# Patient Record
Sex: Male | Born: 1980 | Race: Black or African American | Hispanic: No | Marital: Single | Smoking: Never smoker
Health system: Southern US, Community
[De-identification: ages and names within clinical notes are randomized; demographics above are authoritative.]

## PROBLEM LIST (undated history)

## (undated) DIAGNOSIS — B20 Human immunodeficiency virus [HIV] disease: Principal | ICD-10-CM

## (undated) DIAGNOSIS — F419 Anxiety disorder, unspecified: Secondary | ICD-10-CM

## (undated) DIAGNOSIS — F32A Depression, unspecified: Secondary | ICD-10-CM

## (undated) DIAGNOSIS — F411 Generalized anxiety disorder: Secondary | ICD-10-CM

## (undated) DIAGNOSIS — F329 Major depressive disorder, single episode, unspecified: Secondary | ICD-10-CM

## (undated) DIAGNOSIS — F410 Panic disorder [episodic paroxysmal anxiety] without agoraphobia: Secondary | ICD-10-CM

## (undated) DIAGNOSIS — A630 Anogenital (venereal) warts: Secondary | ICD-10-CM

## (undated) DIAGNOSIS — B59 Pneumocystosis: Secondary | ICD-10-CM

## (undated) DIAGNOSIS — A539 Syphilis, unspecified: Secondary | ICD-10-CM

## (undated) DIAGNOSIS — K648 Other hemorrhoids: Secondary | ICD-10-CM

## (undated) DIAGNOSIS — K644 Residual hemorrhoidal skin tags: Secondary | ICD-10-CM

## (undated) DIAGNOSIS — N289 Disorder of kidney and ureter, unspecified: Secondary | ICD-10-CM

## (undated) DIAGNOSIS — D649 Anemia, unspecified: Secondary | ICD-10-CM

## (undated) DIAGNOSIS — I82409 Acute embolism and thrombosis of unspecified deep veins of unspecified lower extremity: Secondary | ICD-10-CM

## (undated) HISTORY — DX: Acute embolism and thrombosis of unspecified deep veins of unspecified lower extremity: I82.409

## (undated) HISTORY — DX: Major depressive disorder, single episode, unspecified: F32.9

## (undated) HISTORY — DX: Pneumocystosis: B59

## (undated) HISTORY — DX: Depression, unspecified: F32.A

## (undated) HISTORY — DX: Residual hemorrhoidal skin tags: K64.4

## (undated) HISTORY — DX: Syphilis, unspecified: A53.9

## (undated) HISTORY — DX: Human immunodeficiency virus (HIV) disease: B20

## (undated) HISTORY — DX: Anogenital (venereal) warts: A63.0

## (undated) HISTORY — DX: Generalized anxiety disorder: F41.1

## (undated) HISTORY — DX: Disorder of kidney and ureter, unspecified: N28.9

## (undated) HISTORY — DX: Anxiety disorder, unspecified: F41.9

## (undated) HISTORY — DX: Anemia, unspecified: D64.9

## (undated) HISTORY — DX: Other hemorrhoids: K64.8

## (undated) HISTORY — DX: Panic disorder (episodic paroxysmal anxiety): F41.0

---

## 2002-12-27 ENCOUNTER — Emergency Department (HOSPITAL_COMMUNITY): Admission: EM | Admit: 2002-12-27 | Discharge: 2002-12-27 | Payer: Self-pay | Admitting: Emergency Medicine

## 2003-01-11 ENCOUNTER — Emergency Department (HOSPITAL_COMMUNITY): Admission: EM | Admit: 2003-01-11 | Discharge: 2003-01-11 | Payer: Self-pay | Admitting: *Deleted

## 2008-12-10 ENCOUNTER — Emergency Department (HOSPITAL_BASED_OUTPATIENT_CLINIC_OR_DEPARTMENT_OTHER): Admission: EM | Admit: 2008-12-10 | Discharge: 2008-12-10 | Payer: Self-pay | Admitting: Emergency Medicine

## 2008-12-14 ENCOUNTER — Encounter: Admission: RE | Admit: 2008-12-14 | Discharge: 2008-12-14 | Payer: Self-pay | Admitting: Chiropractic Medicine

## 2009-12-05 ENCOUNTER — Encounter: Admission: RE | Admit: 2009-12-05 | Discharge: 2009-12-05 | Payer: Self-pay | Admitting: Nephrology

## 2009-12-05 ENCOUNTER — Encounter: Payer: Self-pay | Admitting: Infectious Disease

## 2009-12-15 ENCOUNTER — Encounter: Payer: Self-pay | Admitting: Emergency Medicine

## 2009-12-15 ENCOUNTER — Ambulatory Visit: Payer: Self-pay | Admitting: Diagnostic Radiology

## 2009-12-16 ENCOUNTER — Inpatient Hospital Stay (HOSPITAL_COMMUNITY): Admission: EM | Admit: 2009-12-16 | Discharge: 2009-12-21 | Payer: Self-pay | Admitting: Internal Medicine

## 2009-12-16 ENCOUNTER — Ambulatory Visit: Payer: Self-pay | Admitting: Vascular Surgery

## 2009-12-16 ENCOUNTER — Encounter (INDEPENDENT_AMBULATORY_CARE_PROVIDER_SITE_OTHER): Payer: Self-pay | Admitting: Internal Medicine

## 2009-12-16 DIAGNOSIS — I2699 Other pulmonary embolism without acute cor pulmonale: Secondary | ICD-10-CM | POA: Insufficient documentation

## 2009-12-18 ENCOUNTER — Ambulatory Visit: Payer: Self-pay | Admitting: Infectious Diseases

## 2009-12-21 ENCOUNTER — Encounter: Payer: Self-pay | Admitting: Infectious Disease

## 2009-12-22 ENCOUNTER — Telehealth: Payer: Self-pay | Admitting: Infectious Disease

## 2009-12-22 ENCOUNTER — Encounter (INDEPENDENT_AMBULATORY_CARE_PROVIDER_SITE_OTHER): Payer: Self-pay | Admitting: *Deleted

## 2009-12-22 ENCOUNTER — Ambulatory Visit: Payer: Self-pay | Admitting: Internal Medicine

## 2009-12-22 DIAGNOSIS — I82409 Acute embolism and thrombosis of unspecified deep veins of unspecified lower extremity: Secondary | ICD-10-CM | POA: Insufficient documentation

## 2009-12-22 LAB — CONVERTED CEMR LAB
INR: 2.88 — ABNORMAL HIGH (ref <1.50)
Prothrombin Time: 30.2 s — ABNORMAL HIGH (ref 11.6–15.2)

## 2009-12-25 ENCOUNTER — Ambulatory Visit: Payer: Self-pay | Admitting: Internal Medicine

## 2009-12-25 ENCOUNTER — Encounter: Payer: Self-pay | Admitting: Infectious Disease

## 2009-12-25 LAB — CONVERTED CEMR LAB: INR: 3.3

## 2010-01-01 ENCOUNTER — Ambulatory Visit: Payer: Self-pay | Admitting: Internal Medicine

## 2010-01-01 ENCOUNTER — Ambulatory Visit: Payer: Self-pay | Admitting: Infectious Disease

## 2010-01-01 DIAGNOSIS — B59 Pneumocystosis: Secondary | ICD-10-CM | POA: Insufficient documentation

## 2010-01-01 DIAGNOSIS — B20 Human immunodeficiency virus [HIV] disease: Secondary | ICD-10-CM | POA: Insufficient documentation

## 2010-01-01 DIAGNOSIS — F0630 Mood disorder due to known physiological condition, unspecified: Secondary | ICD-10-CM | POA: Insufficient documentation

## 2010-01-01 LAB — CONVERTED CEMR LAB
ALT: 62 units/L — ABNORMAL HIGH (ref 0–53)
AST: 34 units/L (ref 0–37)
Albumin: 3.8 g/dL (ref 3.5–5.2)
Alkaline Phosphatase: 113 units/L (ref 39–117)
BUN: 14 mg/dL (ref 6–23)
Basophils Absolute: 0 10*3/uL (ref 0.0–0.1)
Basophils Relative: 0 % (ref 0–1)
CO2: 22 meq/L (ref 19–32)
Calcium: 8.7 mg/dL (ref 8.4–10.5)
Chlamydia, Swab/Urine, PCR: NEGATIVE
Chloride: 101 meq/L (ref 96–112)
Creatinine, Ser: 0.96 mg/dL (ref 0.40–1.50)
Eosinophils Absolute: 0.2 10*3/uL (ref 0.0–0.7)
Eosinophils Relative: 5 % (ref 0–5)
GC Probe Amp, Urine: NEGATIVE
Glucose, Bld: 93 mg/dL (ref 70–99)
HCT: 35.6 % — ABNORMAL LOW (ref 39.0–52.0)
HIV 1 RNA Quant: 639 copies/mL — ABNORMAL HIGH (ref <20)
HIV-1 RNA Quant, Log: 2.81 — ABNORMAL HIGH (ref <1.30)
Hemoglobin: 11.6 g/dL — ABNORMAL LOW (ref 13.0–17.0)
INR: 2.2
Lymphocytes Relative: 16 % (ref 12–46)
Lymphs Abs: 0.8 10*3/uL (ref 0.7–4.0)
MCHC: 32.6 g/dL (ref 30.0–36.0)
MCV: 82.2 fL (ref 78.0–100.0)
Monocytes Absolute: 0.6 10*3/uL (ref 0.1–1.0)
Monocytes Relative: 12 % (ref 3–12)
Neutro Abs: 3.3 10*3/uL (ref 1.7–7.7)
Neutrophils Relative %: 67 % (ref 43–77)
Platelets: 284 10*3/uL (ref 150–400)
Potassium: 4.1 meq/L (ref 3.5–5.3)
RBC: 4.33 M/uL (ref 4.22–5.81)
RDW: 18.4 % — ABNORMAL HIGH (ref 11.5–15.5)
Sodium: 133 meq/L — ABNORMAL LOW (ref 135–145)
Total Bilirubin: 0.2 mg/dL — ABNORMAL LOW (ref 0.3–1.2)
Total Protein: 6.9 g/dL (ref 6.0–8.3)
WBC: 4.9 10*3/uL (ref 4.0–10.5)

## 2010-01-03 ENCOUNTER — Encounter: Payer: Self-pay | Admitting: Infectious Disease

## 2010-01-08 ENCOUNTER — Ambulatory Visit: Payer: Self-pay | Admitting: Internal Medicine

## 2010-01-08 LAB — CONVERTED CEMR LAB: INR: 1.7

## 2010-01-15 ENCOUNTER — Telehealth: Payer: Self-pay | Admitting: Infectious Disease

## 2010-01-15 ENCOUNTER — Ambulatory Visit: Payer: Self-pay | Admitting: Internal Medicine

## 2010-01-15 LAB — CONVERTED CEMR LAB: INR: 1.7

## 2010-01-29 ENCOUNTER — Ambulatory Visit: Payer: Self-pay | Admitting: Internal Medicine

## 2010-01-29 LAB — CONVERTED CEMR LAB: INR: 2.3

## 2010-01-31 ENCOUNTER — Ambulatory Visit: Payer: Self-pay | Admitting: Infectious Disease

## 2010-01-31 ENCOUNTER — Encounter (INDEPENDENT_AMBULATORY_CARE_PROVIDER_SITE_OTHER): Payer: Self-pay | Admitting: *Deleted

## 2010-01-31 LAB — CONVERTED CEMR LAB
ALT: 24 units/L (ref 0–53)
AST: 28 units/L (ref 0–37)
Albumin: 4.6 g/dL (ref 3.5–5.2)
Alkaline Phosphatase: 110 units/L (ref 39–117)
BUN: 6 mg/dL (ref 6–23)
Basophils Absolute: 0.1 10*3/uL (ref 0.0–0.1)
Basophils Relative: 2 % — ABNORMAL HIGH (ref 0–1)
CO2: 24 meq/L (ref 19–32)
Calcium: 9.3 mg/dL (ref 8.4–10.5)
Chloride: 103 meq/L (ref 96–112)
Creatinine, Ser: 1.01 mg/dL (ref 0.40–1.50)
Eosinophils Absolute: 0.5 10*3/uL (ref 0.0–0.7)
Eosinophils Relative: 14 % — ABNORMAL HIGH (ref 0–5)
Glucose, Bld: 100 mg/dL — ABNORMAL HIGH (ref 70–99)
HCT: 40.4 % (ref 39.0–52.0)
HIV 1 RNA Quant: 87 copies/mL — ABNORMAL HIGH (ref <20)
HIV-1 RNA Quant, Log: 1.94 — ABNORMAL HIGH (ref <1.30)
Hemoglobin: 13 g/dL (ref 13.0–17.0)
Lymphocytes Relative: 49 % — ABNORMAL HIGH (ref 12–46)
Lymphs Abs: 1.9 10*3/uL (ref 0.7–4.0)
MCHC: 32.2 g/dL (ref 30.0–36.0)
MCV: 86.5 fL (ref 78.0–100.0)
Monocytes Absolute: 0.6 10*3/uL (ref 0.1–1.0)
Monocytes Relative: 14 % — ABNORMAL HIGH (ref 3–12)
Neutro Abs: 0.9 10*3/uL — ABNORMAL LOW (ref 1.7–7.7)
Neutrophils Relative %: 22 % — ABNORMAL LOW (ref 43–77)
Platelets: 246 10*3/uL (ref 150–400)
Potassium: 4 meq/L (ref 3.5–5.3)
RBC: 4.67 M/uL (ref 4.22–5.81)
RDW: 19.7 % — ABNORMAL HIGH (ref 11.5–15.5)
Sodium: 137 meq/L (ref 135–145)
Total Bilirubin: 0.2 mg/dL — ABNORMAL LOW (ref 0.3–1.2)
Total Protein: 7.4 g/dL (ref 6.0–8.3)
WBC: 3.9 10*3/uL — ABNORMAL LOW (ref 4.0–10.5)

## 2010-02-05 ENCOUNTER — Encounter: Payer: Self-pay | Admitting: Infectious Diseases

## 2010-02-06 ENCOUNTER — Telehealth (INDEPENDENT_AMBULATORY_CARE_PROVIDER_SITE_OTHER): Payer: Self-pay | Admitting: *Deleted

## 2010-02-12 ENCOUNTER — Ambulatory Visit: Payer: Self-pay | Admitting: Internal Medicine

## 2010-02-12 LAB — CONVERTED CEMR LAB: INR: 2.3

## 2010-02-14 ENCOUNTER — Ambulatory Visit: Payer: Self-pay | Admitting: Infectious Disease

## 2010-02-14 ENCOUNTER — Encounter (INDEPENDENT_AMBULATORY_CARE_PROVIDER_SITE_OTHER): Payer: Self-pay | Admitting: *Deleted

## 2010-02-26 ENCOUNTER — Ambulatory Visit: Payer: Self-pay | Admitting: Internal Medicine

## 2010-02-26 LAB — CONVERTED CEMR LAB: INR: 2.8

## 2010-03-19 ENCOUNTER — Ambulatory Visit: Payer: Self-pay | Admitting: Internal Medicine

## 2010-03-19 LAB — CONVERTED CEMR LAB: INR: 2.5

## 2010-03-23 ENCOUNTER — Encounter: Payer: Self-pay | Admitting: Infectious Disease

## 2010-03-28 ENCOUNTER — Encounter: Payer: Self-pay | Admitting: Infectious Disease

## 2010-03-28 ENCOUNTER — Ambulatory Visit
Admission: RE | Admit: 2010-03-28 | Discharge: 2010-03-28 | Payer: Self-pay | Source: Home / Self Care | Attending: Infectious Disease | Admitting: Infectious Disease

## 2010-03-28 LAB — CONVERTED CEMR LAB
ALT: 19 units/L (ref 0–53)
AST: 21 units/L (ref 0–37)
Albumin: 4.9 g/dL (ref 3.5–5.2)
Alkaline Phosphatase: 84 units/L (ref 39–117)
BUN: 11 mg/dL (ref 6–23)
Basophils Absolute: 0 10*3/uL (ref 0.0–0.1)
Basophils Relative: 1 % (ref 0–1)
CO2: 24 meq/L (ref 19–32)
Calcium: 9.9 mg/dL (ref 8.4–10.5)
Chloride: 104 meq/L (ref 96–112)
Cholesterol: 230 mg/dL — ABNORMAL HIGH (ref 0–200)
Creatinine, Ser: 1.02 mg/dL (ref 0.40–1.50)
Eosinophils Absolute: 0.1 10*3/uL (ref 0.0–0.7)
Eosinophils Relative: 4 % (ref 0–5)
Glucose, Bld: 102 mg/dL — ABNORMAL HIGH (ref 70–99)
HCT: 41.6 % (ref 39.0–52.0)
HDL: 62 mg/dL (ref >39)
HIV 1 RNA Quant: <20 copies/mL (ref <20)
HIV-1 RNA Quant, Log: <1.3 (ref <1.30)
Hemoglobin: 13.4 g/dL (ref 13.0–17.0)
LDL Cholesterol: 143 mg/dL — ABNORMAL HIGH (ref 0–99)
Lymphocytes Relative: 44 % (ref 12–46)
Lymphs Abs: 1.3 10*3/uL (ref 0.7–4.0)
MCHC: 32.2 g/dL (ref 30.0–36.0)
MCV: 88.9 fL (ref 78.0–100.0)
Monocytes Absolute: 0.4 10*3/uL (ref 0.1–1.0)
Monocytes Relative: 12 % (ref 3–12)
Neutro Abs: 1.2 10*3/uL — ABNORMAL LOW (ref 1.7–7.7)
Neutrophils Relative %: 39 % — ABNORMAL LOW (ref 43–77)
Platelets: 240 10*3/uL (ref 150–400)
Potassium: 3.9 meq/L (ref 3.5–5.3)
RBC: 4.68 M/uL (ref 4.22–5.81)
RDW: 14.8 % (ref 11.5–15.5)
Sodium: 140 meq/L (ref 135–145)
Total Bilirubin: 0.2 mg/dL — ABNORMAL LOW (ref 0.3–1.2)
Total CHOL/HDL Ratio: 3.7
Total Protein: 7.6 g/dL (ref 6.0–8.3)
Triglycerides: 126 mg/dL (ref <150)
VLDL: 25 mg/dL (ref 0–40)
WBC: 3 10*3/uL — ABNORMAL LOW (ref 4.0–10.5)

## 2010-04-02 LAB — T-HELPER CELL (CD4) - (RCID CLINIC ONLY)
CD4 % Helper T Cell: 4 % — ABNORMAL LOW (ref 33–55)
CD4 T Cell Abs: 60 uL — ABNORMAL LOW (ref 400–2700)

## 2010-04-07 DIAGNOSIS — I82409 Acute embolism and thrombosis of unspecified deep veins of unspecified lower extremity: Secondary | ICD-10-CM

## 2010-04-07 DIAGNOSIS — I2699 Other pulmonary embolism without acute cor pulmonale: Secondary | ICD-10-CM

## 2010-04-07 DIAGNOSIS — Z7901 Long term (current) use of anticoagulants: Secondary | ICD-10-CM

## 2010-04-10 ENCOUNTER — Telehealth: Payer: Self-pay | Admitting: *Deleted

## 2010-04-11 ENCOUNTER — Ambulatory Visit
Admission: RE | Admit: 2010-04-11 | Discharge: 2010-04-11 | Payer: Self-pay | Source: Home / Self Care | Attending: Infectious Disease | Admitting: Infectious Disease

## 2010-04-16 ENCOUNTER — Ambulatory Visit: Admission: RE | Admit: 2010-04-16 | Discharge: 2010-04-16 | Payer: Self-pay | Source: Home / Self Care

## 2010-04-16 LAB — CONVERTED CEMR LAB: INR: 3.9

## 2010-04-17 NOTE — Assessment & Plan Note (Signed)
Summary: [name]  Anticoagulant Therapy Managed by: Barbera Setters. Lance Craig  PharmD CACP PCP: Dr> Kerrie Buffalo Attending: Rogelia Boga MD, Lanora Manis Indication 1: Pulmonary  embolus Indication 2: Encounter for therapeutic drug monitoring  V58.83 Start date: 12/16/2009 Duration: 6 months  Patient Assessment Reviewed by: Chancy Milroy PharmD  January 15, 2010 Medication review: verified warfarin dosage & schedule,verified previous prescription medications, verified doses & any changes, verified new medications, reviewed OTC medications, reviewed OTC health products-vitamins supplements etc Complications: none Dietary changes: none   Health status changes: none   Lifestyle changes: none   Recent/future hospitalizations: none   Recent/future procedures: none   Recent/future dental: none Patient Assessment Part 2:  Have you MISSED ANY DOSES or CHANGED TABLETS?  No missed Warfarin doses or changed tablets.  Have you had any BRUISING or BLEEDING ( nose or gum bleeds,blood in urine or stool)?  No reported bruising or bleeding in nose, gums, urine, stool.  Have you STARTED or STOPPED any MEDICATIONS, including OTC meds,herbals or supplements?  No other medications or herbal supplements were started or stopped.  Have you CHANGED your DIET, especially green vegetables,or ALCOHOL intake?  No changes in diet or alcohol intake.  Have you had any ILLNESSES or HOSPITALIZATIONS?  No reported illnesses or hospitalizations  Have you had any signs of CLOTTING?(chest discomfort,dizziness,shortness of breath,arms tingling,slurred speech,swelling or redness in leg)    No chest discomfort, dizziness, shortness of breath, tingling in arm, slurred speech, swelling, or redness in leg.     Treatment  Target INR: 2.0-3.0 INR: 1.7  Date: 01/15/2010 Regimen In:  30.0mg /week INR reflects regimen in: 1.7  New  Tablet strength: : 5mg  Regimen Out:     Sunday: 1 Tablet     Monday: 1 & 1/2 Tablet     Tuesday: 1  Tablet     Wednesday: 1 Tablet     Thursday: 1 & 1/2 Tablet      Friday: 1 Tablet     Saturday: 1 Tablet Total Weekly: 40.0mg /week mg  Next INR Due: 01/29/2010 Adjusted by: Barbera Setters. Alexandria Lodge [name] PharmD CACP   Return to anticoagulation clinic:  01/29/2010 Time of next visit: 1100    Allergies: No Known Drug Allergies

## 2010-04-17 NOTE — Assessment & Plan Note (Signed)
Summary: [name]  Anticoagulant Therapy Managed by: Barbera Setters. Lance Craig  PharmD CACP [facility] Attending: Blanch Media MD Indication 1: Pulmonary  embolus Indication 2: Encounter for therapeutic drug monitoring  V58.83 Start date: 12/16/2009 Duration: 6 months  Patient Assessment Reviewed by: Chancy Milroy PharmD  January 01, 2010 Medication review: verified warfarin dosage & schedule,verified previous prescription medications, verified doses & any changes, verified new medications, reviewed OTC medications, reviewed OTC health products-vitamins supplements etc Complications: none Dietary changes: none   Health status changes: none   Lifestyle changes: none   Recent/future hospitalizations: none   Recent/future procedures: none   Recent/future dental: none Patient Assessment Part 2:  Have you MISSED ANY DOSES or CHANGED TABLETS?  No missed Warfarin doses or changed tablets.  Have you had any BRUISING or BLEEDING ( nose or gum bleeds,blood in urine or stool)?  No reported bruising or bleeding in nose, gums, urine, stool.  Have you STARTED or STOPPED any MEDICATIONS, including OTC meds,herbals or supplements?  No other medications or herbal supplements were started or stopped.  Have you CHANGED your DIET, especially green vegetables,or ALCOHOL intake?  No changes in diet or alcohol intake.  Have you had any ILLNESSES or HOSPITALIZATIONS?  No reported illnesses or hospitalizations  Have you had any signs of CLOTTING?(chest discomfort,dizziness,shortness of breath,arms tingling,slurred speech,swelling or redness in leg)    No chest discomfort, dizziness, shortness of breath, tingling in arm, slurred speech, swelling, or redness in leg.     Treatment  Target INR: 2.0-3.0 INR: 2.2  Date: 01/01/2010 INR reflects regimen in: 2.2  New  Tablet strength: : 5mg  Regimen Out:     Sunday: 1/2 Tablet     Monday: 1/ Tablet     Tuesday: 1/2 Tablet     Wednesday: 1 Tablet     Thursday:  1/2 Tablet      Friday: 1 Tablet     Saturday: 1/2 Tablet Total Weekly: 25.0mg /week mg  Next INR Due: 01/08/2010 Adjusted by: Barbera Setters. Alexandria Lodge [name] PharmD CACP   Return to anticoagulation clinic:  01/08/2010 Time of next visit: 1030

## 2010-04-17 NOTE — Assessment & Plan Note (Signed)
Summary: [name]  Anticoagulant Therapy Managed by: Barbera Setters. Janie Morning  PharmD CACP PCP: Dr> Bascom Levels [facility] Attending: Darl Pikes, Beth Indication 1: Pulmonary  embolus Indication 2: Encounter for therapeutic drug monitoring  V58.83 Start date: 12/16/2009 Duration: 6 months  Patient Assessment Reviewed by: Chancy Milroy PharmD  February 12, 2010 Medication review: verified warfarin dosage & schedule,verified previous prescription medications, verified doses & any changes, verified new medications, reviewed OTC medications, reviewed OTC health products-vitamins supplements etc Complications: none Dietary changes: none   Health status changes: none   Lifestyle changes: none   Recent/future hospitalizations: none   Recent/future procedures: none   Recent/future dental: none Patient Assessment Part 2:  Have you MISSED ANY DOSES or CHANGED TABLETS?  No missed Warfarin doses or changed tablets.  Have you had any BRUISING or BLEEDING ( nose or gum bleeds,blood in urine or stool)?  No reported bruising or bleeding in nose, gums, urine, stool.  Have you STARTED or STOPPED any MEDICATIONS, including OTC meds,herbals or supplements?  No other medications or herbal supplements were started or stopped.  Have you CHANGED your DIET, especially green vegetables,or ALCOHOL intake?  No changes in diet or alcohol intake.  Have you had any ILLNESSES or HOSPITALIZATIONS?  No reported illnesses or hospitalizations  Have you had any signs of CLOTTING?(chest discomfort,dizziness,shortness of breath,arms tingling,slurred speech,swelling or redness in leg)    No chest discomfort, dizziness, shortness of breath, tingling in arm, slurred speech, swelling, or redness in leg.     Treatment  Target INR: 2.0-3.0 INR: 2.3  Date: 02/12/2010 Regimen In:  45.0mg /week INR reflects regimen in: 2.3  New  Tablet strength: : 5mg  Regimen Out:     Sunday: 1 & 1/2 Tablet     Monday: 1 & 1/2 Tablet     Tuesday: 1  & 1/2 Tablet     Wednesday: 1 & 1/2 Tablet     Thursday: 1 & 1/2 Tablet      Friday: 1 & 1/2 Tablet     Saturday: 1 & 1/2 Tablet Total Weekly: 52.5mg /week mg  Next INR Due: 02/26/2010 Adjusted by: Barbera Setters. Alexandria Lodge [name] PharmD CACP   Return to anticoagulation clinic:  02/26/2010 Time of next visit: 1145   Comments: Sees Dr. Daiva Eves for ID. Dr. Daiva Eves is who referred this patient to our anticoagulation clinic.  Allergies: No Known Drug Allergies

## 2010-04-17 NOTE — Miscellaneous (Signed)
Summary: HIPAA Restrictions  HIPAA Restrictions   Imported By: Florinda Marker 01/03/2010 10:31:50  _____________________________________________________________________  External Attachment:    Type:   Image     Comment:   External Document

## 2010-04-17 NOTE — Assessment & Plan Note (Signed)
Summary: 6WK F/U/VS   Visit Type:  Follow-up Referring Provider:  Dr. Bascom Levels Primary Provider:  Dr. Daiva Eves  CC:  f/u .  History of Present Illness: 30 year old African American male recently admitted to [facility] after failign thrapy with fluoroquinlone and azitthromcyin for pneumonia bilateral, foudn to have Pulmoanry embolism and PCP pneumonia and newly diagnosied HIV, AIDS. HIs cd4<10 and his viral load in hudrends of thousands. His Hep A, B sag, sab, C abs were negative and his rpr negative in screenign. He was started on atripla as inpatient. Genotgype prior to starting ARV showed one etravirine mutaiton but full susceptiblity to [place]. He has done relatively well as outpt but still not plugged into adap. He is continuing his tmp/smx as prophylaxis. He is being followed by Dr. Alexandria Lodge in [facility] to assist with coumadin dosing. 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 Take 1 Tab By Mouth At Bedtime 2)  Sulfamethoxazole-Trimethoprim 400-80 Mg Tabs (Sulfamethoxazole-Trimethoprim) .... Take 1 Tablet By Mouth Once A Day 3)  Prednisone 20 Mg Tabs (Prednisone) .... Take Two Tablets Twice Daily Thru 10/8, Then Two Tablets Once Daily Thru 10/13, Then On Tab Daily Thru 10/23 4)  Zithromax 600 Mg Tabs (Azithromycin) .... Take Two Tabets Weekly 5)  Ativan 0.5 Mg Tabs (Lorazepam) .... 1/2 By Mouth Every 8 Hours As Needed 6)  Ambien 5 Mg Tabs (Zolpidem Tartrate) .Marland Kitchen.. 1 Daily At Bedtime 7)  Flexeril 10 Mg Tabs (Cyclobenzaprine Hcl) .Marland Kitchen.. 1 Tablet By Mouth Daily As Needed 8)  Folic Acid 1 Mg Tabs (Folic Acid) .Marland Kitchen.. 1 Milligram By Mouth Daily 9)   Hydrocodone-Acetaminophen 5-325 Mg Tabs (Hydrocodone-Acetaminophen) .Marland Kitchen.. 1 To 2 Tablets By Mouth Every 6 Hours As Needed  Current Medications (verified): 1)  Atripla 600-200-300 Mg Tabs (Efavirenz-Emtricitab-Tenofovir) .... Take 1 Tab By Mouth At Bedtime 2)  Sulfamethoxazole-Trimethoprim 400-80 Mg Tabs (Sulfamethoxazole-Trimethoprim) .... Take 1 Tablet By Mouth Once A Day 3)  Zithromax 600 Mg Tabs (Azithromycin) .... Take Two Tabets Weekly 4)  Ativan 0.5 Mg Tabs (Lorazepam) .... 1/2 By Mouth Every 8 Hours As Needed 5)  Ambien 5 Mg Tabs (Zolpidem Tartrate) .Marland Kitchen.. 1 Daily At Bedtime 6)  Folic Acid 1 Mg Tabs (Folic Acid) .Marland Kitchen.. 1 Milligram By Mouth Daily 7)  Hydrocodone-Acetaminophen 5-325 Mg Tabs (Hydrocodone-Acetaminophen) .Marland Kitchen.. 1 To 2 Tablets By Mouth Every 6 Hours As Needed 8)  Coumadin 5 Mg Tabs (Warfarin Sodium) .... Take As Directed  Allergies (verified): No Known Drug Allergies    Current Allergies (reviewed today): No known allergies  Past History:  Past Medical History: Last updated: 01/01/2010 AIDS Sepember 2011 PCP pneumonia 2011 Pulmonary embolism Anxiety  Family History: Last updated: 01/01/2010 noncontribitory  Social History: Last updated: 01/01/2010 risk for HIV, MSM, rare alcohol  Family History: Reviewed history from 01/01/2010 and no changes required. noncontribitory  Social History: Reviewed history from 01/01/2010 and no changes required. risk for HIV, MSM, rare alcohol  Review of Systems  The patient denies anorexia, fever, weight loss, weight gain, vision loss, decreased hearing, hoarseness, chest pain, syncope, dyspnea on exertion, peripheral edema, prolonged cough, headaches, hemoptysis, abdominal pain, melena, hematochezia, severe indigestion/heartburn, hematuria, incontinence, genital sores, muscle weakness, suspicious  skin lesions, transient blindness, difficulty walking, depression, unusual weight change, abnormal bleeding, and enlarged lymph nodes.     Vital Signs:  Patient profile:   30 year old male Height:      68 inches (172.72 cm) Weight:      178.25 pounds (81.02 kg) BMI:     27.20 Temp:     98.3 degrees F (36.83 degrees C) oral Pulse rate:   78 / minute BP sitting:   135 / 86  (left arm)  Vitals Entered By: Starleen Arms CMA (February 14, 2010 10:02 AM) CC: f/u  Is Patient Diabetic? No Pain Assessment Patient in pain? no      Nutritional Status BMI of 25 - 29 = overweight  Does patient need assistance? Functional Status Self care Ambulation Normal   Physical Exam  General:  alert and well-nourished.  anxioius and with partner who was loudly and impatiently sighing initially Head:  normocephalic and atraumatic.   Eyes:  vision grossly intact, pupils equal, pupils round, and pupils reactive to light.   Ears:  no external deformities.   Nose:  no external deformity and no external erythema.   Mouth:  pharynx pink and moist, no erythema, and no exudates.  no thrush Lungs:  normal respiratory effort, no crackles, and no wheezes.   Heart:  normal rate, regular rhythm, no murmur, no gallop, and no rub.   Abdomen:  soft, non-tender, normal bowel sounds, and no distention.   Msk:  normal ROM and no joint tenderness.   Extremities:  No clubbing, cyanosis, edema, or deformity noted with normal full range of motion of all joints.   Neurologic:  alert & oriented X3, strength normal in all extremities, and gait normal.   Skin:  color normal, no rashes, and no suspicious lesions.   Psych:  Oriented X3, memory intact for recent and remote, dysphoric affect, and depressed affect.          Medication Adherence: 02/14/2010   Adherence to medications reviewed with patient. Counseling to provide adequate adherence provided      Education Materials Provided: 02/14/2010 Safe sex practices discussed with patient. Condoms offered.                          Impression & Recommendations:  Problem # 1:  AIDS  (ICD-042)  Good virological control His updated medication list for this problem includes:    Sulfamethoxazole-trimethoprim 400-80 Mg Tabs (Sulfamethoxazole-trimethoprim) .Marland Kitchen... Take 1 tablet by mouth once a day    Zithromax 600 Mg Tabs (Azithromycin) .Marland Kitchen... Take two tabets weekly  Diagnostics Reviewed:  HIV: CDC-defined AIDS (01/01/2010)   CD4: 40 (02/01/2010)   WBC: 3.9 (01/31/2010)   Hgb: 13.0 (01/31/2010)   HCT: 40.4 (01/31/2010)   Platelets: 246 (01/31/2010) HIV-1 RNA: 87 (01/31/2010)     Orders: Est. Patient Level IV (11914)  Problem # 2:  PNEUMOCYSTIS PNEUMONIA (ICD-136.3)  resolved  Orders: Est. Patient Level IV (78295)  Problem # 3:  PE (ICD-415.19)  continue coumadin His updated medication list for this problem includes:    Coumadin 5 Mg Tabs (Warfarin sodium) .Marland Kitchen... Take as directed  Orders: Est. 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 Patient Level IV (86578)  Medications Added to Medication List This Visit: 1)  Coumadin 5 Mg Tabs (Warfarin sodium) .... Take as directed  Other Orders: Hepatitis A Vaccine (Adult Dose) (46962) Admin  1st Vaccine (16109) Hepatitis B Vaccine >93yrs [phone]) Admin of Any Addtl Vaccine (09811) Future Orders: T-CD4SP ([facility]) (CD4SP) ... 03/28/2010 T-HIV Viral Load [phone]) ... 03/28/2010 T-CBC w/Diff ([id number]) ... 03/28/2010 T-Lipid Profile [phone]) ... 03/28/2010 T-Comprehensive Metabolic Panel [phone]) ... 03/28/2010  Patient Instructions: 1)  Please schedule a follow-up appointment in 2  month. 2)  Advised not to eat any food or drink any liquids after 10 PM the night beforelabs 3)  Be sure to return for lab work one (1) week before your next appointment as scheduled.    Immunizations Administered:  Hepatitis A Vaccine # 1:    Vaccine Type: HepA    Site: right deltoid    Mfr: Merck    Dose: 1.0 ml     Route: IM    Given by: Starleen Arms CMA    Exp. Date: 03/05/2012    Lot #: [id number]    VIS given: 06/05/04 version given February 14, 2010.  Hepatitis B Vaccine # 1:    Vaccine Type: HepB Adult    Site: right deltoid    Mfr: GlaxoSmithKline    Dose: 1.0 ml    Route: IM    Given by: Starleen Arms CMA    Exp. Date: 04/20/2012    Lot #: [id number]    VIS given: 10/02/05 version given February 14, 2010. Prescriptions: COUMADIN 5 MG TABS (WARFARIN SODIUM) take as directed  #30 x 11   Entered and Authorized by:   Acey Lav MD   Signed by:   Paulette Blanch Dam MD on 02/14/2010   Method used:   Electronically to        [facility] [address] (retail)       [phone]       [place], [place]  [address]       Ph: [phone]       Fax: [phone]   RxID:   [id number] FOLIC ACID 1 MG TABS (FOLIC ACID) 1 milligram by mouth daily  #30 x 1   Entered and Authorized by:   Acey Lav MD   Signed by:   Paulette Blanch Dam MD on 02/14/2010   Method used:   Electronically to        [facility] [address] (retail)       [phone]       [place], [place]  [address]       Ph: [phone]       Fax: [phone]   RxID:   [id number] HYDROCODONE-ACETAMINOPHEN 5-325 MG TABS (HYDROCODONE-ACETAMINOPHEN) 1 to 2 tablets by mouth every 6 hours as needed  #20 x 0   Entered and Authorized by:   Acey Lav MD   Signed by:   Paulette Blanch Dam MD on 02/14/2010   Method used:   Print then Give to Patient   RxID:   [id number]

## 2010-04-17 NOTE — Miscellaneous (Signed)
Summary: Medicatiion update  Clinical Lists Changes  Medications: Changed medication from SULFAMETHOXAZOLE-TRIMETHOPRIM 400-80 MG TABS (SULFAMETHOXAZOLE-TRIMETHOPRIM) two tablets three times daily until October 23rd, then one tablet daily to SULFAMETHOXAZOLE-TRIMETHOPRIM 400-80 MG TABS (SULFAMETHOXAZOLE-TRIMETHOPRIM) Take 1 tablet by mouth once a day

## 2010-04-17 NOTE — Miscellaneous (Signed)
Summary: [facility] Financial Update   Clinical Lists Changes  Observations: Added new observation of PAYOR: No Insurance (02/14/2010 11:17) Added new observation of RWTITLE: B (02/14/2010 11:17) Added new observation of AIDSDAP: Yes 2011 (02/14/2010 11:17) Added new observation of PCTFPL: 0  (02/14/2010 11:17) Added new observation of INCOMESOURCE: none  (02/14/2010 11:17) Added new observation of HOUSEINCOME: 0  (02/14/2010 11:17) Added new observation of #CHILD<18 IN: 0  (02/14/2010 11:17) Added new observation of FAMILYSIZE: 1  (02/14/2010 11:17) Added new observation of HOUSING: Stable/permanent  (02/14/2010 11:17) Added new observation of FINASSESSDT: 02/14/2010  (02/14/2010 11:17) Added new observation of YEARLYEXPEN: 0  (02/14/2010 11:17)

## 2010-04-17 NOTE — Progress Notes (Signed)
Summary: Refill, OK to refill for pt?  Phone Note Refill Request Message from:  Patient on January 15, 2010 11:36 AM  Refills Requested: Medication #1:  FOLIC ACID 1 MG TABS 1 milligram by mouth daily Pt here to see Dr. Alexandria Lodge asking for refill on medication.   Follow-up for Phone Call        Fine with me Follow-up by: Acey Lav MD,  January 17, 2010 8:20 PM

## 2010-04-17 NOTE — Progress Notes (Signed)
Summary: ADAP meds  Phone Note Call from Patient   Caller: Patient Summary of Call: pt. came into office to check on ADAP meds, called [facility] and spoke with pharmacist, Asim they were unable to reach pt.  Meds will be overnight shipped and should arrive tomorrow 02/07/10 Initial call taken by: Wendall Mola CMA Duncan Dull),  February 06, 2010 11:34 AM     Appended Document: ADAP meds ADAP meds arrived, pt. picked up

## 2010-04-17 NOTE — Miscellaneous (Signed)
Summary: Adrian DDS  Pringle DDS   Imported By: Florinda Marker 02/06/2010 11:42:08  _____________________________________________________________________  External Attachment:    Type:   Image     Comment:   External Document

## 2010-04-17 NOTE — Assessment & Plan Note (Addendum)
Summary: hsfu 042/need chart/kvd sawinpt   Visit Type:  Follow-up Referring Provider:  Dr. Bascom Levels Primary Provider:  Dr> Bascom Levels  CC:  AIDs and PCP pneumoinia.  History of Present Illness: 30 year old African American male recently admitted to [facility] after failign thrapy with fluoroquinlone and azitthromcyin for pneumonia bilateral, foudn to have Pulmoanry embolism and PCP pneumonia and newly diagnosied HIV, AIDS. HIs cd4<10 and his viral load in hudrends of thousands. His Hep A, B sag, sab, C abs were negative and his rpr negative in screenign. He was started on atripla as inpatient. Genotgype prior to starting ARV showed one etravirine mutaiton but full susceptiblity to [place]. He has done relatively well as outpt but still not plugged into adap. He took last TMP SMX pill yesterday. He is moderately anxoius. When I offered him SSRI he seemd initally willin to tak script but then decided again this when his partner (unkown HIV status, apparently Ab negative) suggested against this.  Problems Prior to Update: 1)  Pe  (ICD-415.19) 2)  Long-term Use of Antiplatelet/antithrombotic  (ICD-V58.63)  Medications Prior to Update: 1)  Atripla 600-200-300 Mg Tabs (Efavirenz-Emtricitab-Tenofovir) .... Take 1 Tab By Mouth At Bedtime 2)  Sulfamethoxazole-Trimethoprim 400-80 Mg Tabs (Sulfamethoxazole-Trimethoprim) .... Two Tablets Three Times Daily Until October 23rd, Then One Tablet Daily 3)  Prednisone 20 Mg Tabs (Prednisone) .... Take Two Tablets Twice Daily Thru 10/8, Then Two Tablets Once Daily Thru 10/13, Then On Tab Daily Thru 10/23 4)  Zithromax 600 Mg Tabs (Azithromycin) .... Take Two Tabets Weekly 5)  Warfarin Sodium 5 Mg Tabs (Warfarin Sodium) .... Take As Directed.  Current Medications (verified): 1)  Atripla 600-200-300 Mg Tabs (Efavirenz-Emtricitab-Tenofovir) .... Take 1 Tab By Mouth At Bedtime 2)  Sulfamethoxazole-Trimethoprim 400-80 Mg Tabs (Sulfamethoxazole-Trimethoprim) .... Two Tablets  Three Times Daily Until October 23rd, Then One Tablet Daily 3)  Prednisone 20 Mg Tabs (Prednisone) .... Take Two Tablets Twice Daily Thru 10/8, Then Two Tablets Once Daily Thru 10/13, Then On Tab Daily Thru 10/23 4)  Zithromax 600 Mg Tabs (Azithromycin) .... Take Two Tabets Weekly 5)  Warfarin Sodium 5 Mg Tabs (Warfarin Sodium) .... Take As Directed. 6)  Ativan 0.5 Mg Tabs (Lorazepam) .... 1/2 By Mouth Every 8 Hours As Needed 7)  Ambien 5 Mg Tabs (Zolpidem Tartrate) .Marland Kitchen.. 1 Daily At Bedtime 8)  Flexeril 10 Mg Tabs (Cyclobenzaprine Hcl) .Marland Kitchen.. 1 Tablet By Mouth Daily As Needed 9)  Folic Acid 1 Mg Tabs (Folic Acid) .Marland Kitchen.. 1 Milligram By Mouth Daily 10)  Hydrocodone-Acetaminophen 5-325 Mg Tabs (Hydrocodone-Acetaminophen) .Marland Kitchen.. 1 To 2 Tablets By Mouth Every 6 Hours As Needed  Allergies (verified): No Known Drug Allergies    Current Allergies (reviewed today): No known allergies  Past History:  Past Medical History: AIDS Sepember 2011 PCP pneumonia 2011 Pulmonary embolism Anxiety  Family History: noncontribitory  Social History: risk for HIV, MSM, rare alcohol  Review of Systems       The patient complains of depression.  The patient denies anorexia, fever, weight loss, weight gain, vision loss, decreased hearing, hoarseness, chest pain, syncope, dyspnea on exertion, peripheral edema, prolonged cough, headaches, hemoptysis, abdominal pain, melena, hematochezia, severe indigestion/heartburn, hematuria, incontinence, genital sores, muscle weakness, suspicious skin lesions, transient blindness, difficulty walking, unusual weight change, abnormal bleeding, and enlarged lymph nodes.    Vital Signs:  Patient profile:   30 year old male Height:      68 inches Weight:      166.25 pounds (75.57 kg) Temp:  97.3 degrees F (36.28 degrees C) oral Pulse rate:   108 / minute BP sitting:   134 / 85  (left arm)  Vitals Entered By: Starleen Arms CMA (January 01, 2010 3:09 PM)  Physical  Exam  General:  alert and well-nourished.  anxioius and with partner who was loudly and impatiently sighing initially Head:  normocephalic and atraumatic.   Eyes:  vision grossly intact, pupils equal, pupils round, and pupils reactive to light.   Ears:  no external deformities.   Nose:  no external deformity and no external erythema.   Mouth:  pharynx pink and moist, no erythema, and no exudates.  no thrush Lungs:  normal respiratory effort, no crackles, and no wheezes.   Heart:  normal rate, regular rhythm, no murmur, no gallop, and no rub.   Abdomen:  soft, non-tender, normal bowel sounds, and no distention.   Msk:  normal ROM and no joint tenderness.   Extremities:  No clubbing, cyanosis, edema, or deformity noted with normal full range of motion of all joints.   Neurologic:  alert & oriented X3, strength normal in all extremities, and gait normal.   Skin:  color normal, no rashes, and no suspicious lesions.   Psych:  Oriented X3, memory intact for recent and remote, dysphoric affect, and depressed affect.     Impression & Recommendations:  Problem # 1:  AIDS (ICD-042)  Recheck labs today. Get into [facility], [facility] to see. RTC for repeat labs in 4 wks and repaeat visit with me in 6 wks vaciante for hep a and b ad flu and pneumovax as intpt October 2011 His updated medication list for this problem includes:    Sulfamethoxazole-trimethoprim 400-80 Mg Tabs (Sulfamethoxazole-trimethoprim) .Marland Kitchen..Marland Kitchen Two tablets three times daily until october 23rd, then one tablet daily    Zithromax 600 Mg Tabs (Azithromycin) .Marland Kitchen... Take two tabets weekly  His updated medication list for this problem includes:    Sulfamethoxazole-trimethoprim 400-80 Mg Tabs (Sulfamethoxazole-trimethoprim) .Marland Kitchen..Marland Kitchen Two tablets three times daily until october 23rd, then one tablet daily    Zithromax 600 Mg Tabs (Azithromycin) .Marland Kitchen... Take two tabets weekly  Orders: Est. Patient Level IV (16109)  Problem # 2:  PNEUMOCYSTIS PNEUMONIA  (ICD-136.3)  finish course of high dose TMPSMX and prednisone then PCP prophylaxis  Orders: Est. Patient Level IV (60454)  Problem # 3:  PE (ICD-415.19)  coumadin dosing hopeuflly simplified with less tmpsmx His updated medication list for this problem includes:    Warfarin Sodium 5 Mg Tabs (Warfarin sodium) .Marland Kitchen... Take as directed.  Orders: Est. 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 offered sssri, referred to [facility]  Orders: Est. Patient Level IV (91478)  Medications Added to Medication List This Visit: 1)  Ativan 0.5 Mg Tabs (Lorazepam) .... 1/2 by mouth every 8 hours as needed 2)  Ambien 5 Mg Tabs (Zolpidem tartrate) .Marland Kitchen.. 1 daily at bedtime 3)  Flexeril 10 Mg Tabs (Cyclobenzaprine hcl) .Marland Kitchen.. 1 tablet by mouth daily as needed 4)  Folic Acid 1 Mg Tabs (Folic acid) .Marland Kitchen.. 1 milligram by mouth daily 5)  Hydrocodone-acetaminophen 5-325 Mg Tabs (Hydrocodone-acetaminophen) .Marland Kitchen.. 1 to 2 tablets by mouth every 6 hours as needed  Other Orders: T-GC Probe, urine [phone]) T-Chlamydia  Probe, urine [phone]) T-CD4SP [facility]) (CD4SP) T-HIV Viral Load [phone]) T-Comprehensive Metabolic Panel [id number]) T-CBC w/Diff [id number]) Future Orders: T-CD4SP ([facility]) (CD4SP) ... 02/01/2010 T-HIV Viral Load [id number]) ... 02/01/2010 T-CBC w/Diff ([id number]) .Marland KitchenMarland Kitchen  02/01/2010 T-Comprehensive Metabolic Panel [id number]) ... 02/01/2010       Medication Adherence: 01/01/2010   Adherence to medications reviewed with patient. Counseling to provide adequate adherence provided   Prevention For Positives: 01/01/2010   Safe sex practices discussed with patient. Condoms offered.                              Immunization History:  Influenza Immunization History:    Influenza:  historical (12/20/2009)  Pneumovax Immunization History:    Pneumovax:  historical  (12/20/2009)  Hepatitis B Vaccine # 1 (to be given today)  Hepatitis A Vaccine # 1 (to be given today)   Patient Instructions: 1)  we will have you talk to case manager from [facility] today 2)  we will get some blood work today 3)  and have you come back in 4 wks for repeat blood work and repeat visit with Dr. Daiva Eves in 6 wks Prescriptions: PREDNISONE 20 MG TABS (PREDNISONE) take two tablets twice daily thru 10/8, then two tablets once daily thru 10/13, then on tab daily thru 10/23  #1 x 0   Entered and Authorized by:   Acey Lav MD   Signed by:   Paulette Blanch Dam MD on 01/01/2010   Method used:   Print then Give to Patient   RxID:   [id number] ATIVAN 0.5 MG TABS (LORAZEPAM) 1/2 by mouth every 8 hours as needed  #30 x 3   Entered and Authorized by:   Acey Lav MD   Signed by:   Paulette Blanch Dam MD on 01/01/2010   Method used:   Print then Give to Patient   RxID:   [id number] AMBIEN 5 MG TABS (ZOLPIDEM TARTRATE) 1 daily at bedtime  #30 x 3   Entered and Authorized by:   Acey Lav MD   Signed by:   Paulette Blanch Dam MD on 01/01/2010   Method used:   Print then Give to Patient   RxID:   [id number] ATIVAN 0.5 MG TABS (LORAZEPAM) 1/2 by mouth every 8 hours as needed  #20 x 3   Entered and Authorized by:   Acey Lav MD   Signed by:   Paulette Blanch Dam MD on 01/01/2010   Method used:   Print then Give to Patient   RxID:   [id number]

## 2010-04-17 NOTE — Assessment & Plan Note (Signed)
Summary: [name]  Anticoagulant Therapy Managed by: Barbera Setters. Lance Craig  PharmD CACP PCP: Dr> Kerrie Buffalo Attending: Onalee Hua MD, Manrique Indication 1: Pulmonary  embolus Indication 2: Encounter for therapeutic drug monitoring  V58.83 Start date: 12/16/2009 Duration: 6 months  Patient Assessment Reviewed by: Chancy Milroy PharmD  January 08, 2010 Medication review: verified warfarin dosage & schedule,verified previous prescription medications, verified doses & any changes, verified new medications, reviewed OTC medications, reviewed OTC health products-vitamins supplements etc Complications: none Dietary changes: none   Health status changes: none   Lifestyle changes: none   Recent/future hospitalizations: none   Recent/future procedures: none   Recent/future dental: none Patient Assessment Part 2:  Have you MISSED ANY DOSES or CHANGED TABLETS?  No missed Warfarin doses or changed tablets.  Have you had any BRUISING or BLEEDING ( nose or gum bleeds,blood in urine or stool)?  No reported bruising or bleeding in nose, gums, urine, stool.  Have you STARTED or STOPPED any MEDICATIONS, including OTC meds,herbals or supplements?  No other medications or herbal supplements were started or stopped.  Have you CHANGED your DIET, especially green vegetables,or ALCOHOL intake?  No changes in diet or alcohol intake.  Have you had any ILLNESSES or HOSPITALIZATIONS?  No reported illnesses or hospitalizations  Have you had any signs of CLOTTING?(chest discomfort,dizziness,shortness of breath,arms tingling,slurred speech,swelling or redness in leg)    No chest discomfort, dizziness, shortness of breath, tingling in arm, slurred speech, swelling, or redness in leg.     Treatment  Target INR: 2.0-3.0 INR: 1.7  Date: 01/08/2010 Regimen In:  25.0mg /week INR reflects regimen in: 1.7  New  Tablet strength: : 5mg  Regimen Out:     Sunday: 1 Tablet     Monday: 1 Tablet     Tuesday: 1 Tablet    Wednesday: 0 Tablet     Thursday: 1 Tablet      Friday: 1 Tablet     Saturday: 1 Tablet Total Weekly: 30.0mg /week mg  Next INR Due: 01/15/2010 Adjusted by: Barbera Setters. Alexandria Lodge [name] PharmD CACP   Return to anticoagulation clinic:  01/15/2010 Time of next visit: 1115    Allergies: No Known Drug Allergies

## 2010-04-17 NOTE — Miscellaneous (Signed)
Summary: PATIENT CONSENT  PATIENT CONSENT   Imported By: Louretta Parma 12/26/2009 12:10:15  _____________________________________________________________________  External Attachment:    Type:   Image     Comment:   External Document

## 2010-04-17 NOTE — Miscellaneous (Signed)
Summary: Orders Update Per [facility]  Clinical Lists Changes  Problems: Added new problem of DVT (ICD-453.40) Orders: Added new Test order of T-Protime, Auto [phone]) - Signed     Process Orders Check Orders Results:     [facility]: ABN not required for this insurance Tests Sent for requisitioning (January 11, 2010 11:50 AM):     12/22/2009: [facility] -- T-Protime, Auto [phone] (signed)

## 2010-04-17 NOTE — Progress Notes (Signed)
Summary: pt,inr/ hla  Phone Note Other Incoming   Summary of Call: called to lab to speak w/ pt and jay groce, to relay instructions on coumadin and pt, inr. spoke w/ dr Alexandria Lodge after results were given to him by lab, traci. pt is to take 1/2 tablet this evening also 1/2 tablet sat, sun he is then to come to coumadin clinic w/ dr groce mon 10/10 at 1130. if he has any concernable bleeding between now and then he is to go asap to ED, if any ?'s call [phone] and ask for resident on call, pt and caregiver voiced understanding. appt scheduled w/ dr Alexandria Lodge for mon. Initial call taken by: Marin Roberts RN,  December 22, 2009 4:50 PM  Follow-up for Phone Call        Thanks Vonna Kotyk! Follow-up by: Acey Lav MD,  December 25, 2009 8:23 AM

## 2010-04-17 NOTE — Assessment & Plan Note (Signed)
Summary: [name]  Anticoagulant Therapy Managed by: Barbera Setters. Janie Morning  PharmD CACP PCP: Dr> Bascom Levels [facility] Attending: Lowella Bandy MD Indication 1: Pulmonary  embolus Indication 2: Encounter for therapeutic drug monitoring  V58.83 Start date: 12/16/2009 Duration: 6 months  Patient Assessment Reviewed by: Chancy Milroy PharmD  January 29, 2010 Medication review: verified warfarin dosage & schedule,verified previous prescription medications, verified doses & any changes, verified new medications, reviewed OTC medications, reviewed OTC health products-vitamins supplements etc Complications: none Dietary changes: none   Health status changes: none   Lifestyle changes: none   Recent/future hospitalizations: none   Recent/future procedures: none   Recent/future dental: none Patient Assessment Part 2:  Have you MISSED ANY DOSES or CHANGED TABLETS?  No missed Warfarin doses or changed tablets.  Have you had any BRUISING or BLEEDING ( nose or gum bleeds,blood in urine or stool)?  No reported bruising or bleeding in nose, gums, urine, stool.  Have you STARTED or STOPPED any MEDICATIONS, including OTC meds,herbals or supplements?  No other medications or herbal supplements were started or stopped.  Have you CHANGED your DIET, especially green vegetables,or ALCOHOL intake?  No changes in diet or alcohol intake.  Have you had any ILLNESSES or HOSPITALIZATIONS?  No reported illnesses or hospitalizations  Have you had any signs of CLOTTING?(chest discomfort,dizziness,shortness of breath,arms tingling,slurred speech,swelling or redness in leg)    No chest discomfort, dizziness, shortness of breath, tingling in arm, slurred speech, swelling, or redness in leg.     Treatment  Target INR: 2.0-3.0 INR: 2.3  Date: 01/29/2010 Regimen In:  40.0mg /week INR reflects regimen in: 2.3  New  Tablet strength: : 5mg  Regimen Out:     Sunday: 1 & 1/2 Tablet     Monday: 1 Tablet     Tuesday: 1 & 1/2  Tablet     Wednesday: 1 Tablet     Thursday: 1 & 1/2 Tablet      Friday: 1 Tablet     Saturday: 1 & 1/2 Tablet Total Weekly: 45.0mg /week mg  Next INR Due: 02/12/2010 Adjusted by: Barbera Setters. Alexandria Lodge [name] PharmD CACP   Return to anticoagulation clinic:  02/12/2010 Time of next visit: 1115    Allergies: No Known Drug Allergies

## 2010-04-17 NOTE — Miscellaneous (Signed)
Summary: New 042  Clinical Lists Changes  Medications: Added new medication of ATRIPLA 600-200-300 MG TABS (EFAVIRENZ-EMTRICITAB-TENOFOVIR) Take 1 tab by mouth at bedtime - Signed Added new medication of SULFAMETHOXAZOLE-TRIMETHOPRIM 400-80 MG TABS (SULFAMETHOXAZOLE-TRIMETHOPRIM) two tablets three times daily until October 23rd, then one tablet daily - Signed Added new medication of PREDNISONE 20 MG TABS (PREDNISONE) take two tablets twice daily thru 10/8, then two tablets once daily thru 10/13, then on tab daily thru 10/23 - Signed Added new medication of ZITHROMAX 600 MG TABS (AZITHROMYCIN) take two tabets weekly - Signed Rx of ATRIPLA 600-200-300 MG TABS (EFAVIRENZ-EMTRICITAB-TENOFOVIR) Take 1 tab by mouth at bedtime;  #30 x 11;  Signed;  Entered by: Acey Lav MD;  Authorized by: Paulette Blanch Dam MD;  Method used: Print then Give to Patient Rx of SULFAMETHOXAZOLE-TRIMETHOPRIM 400-80 MG TABS (SULFAMETHOXAZOLE-TRIMETHOPRIM) two tablets three times daily until October 23rd, then one tablet daily;  #60 x 11;  Signed;  Entered by: Paulette Blanch Dam MD;  Authorized by: Paulette Blanch Dam MD;  Method used: Print then Give to Patient Rx of PREDNISONE 20 MG TABS (PREDNISONE) take two tablets twice daily thru 10/8, then two tablets once daily thru 10/13, then on tab daily thru 10/23;  #33 x 0;  Signed;  Entered by: Acey Lav MD;  Authorized by: Paulette Blanch Dam MD;  Method used: Print then Give to Patient Rx of ZITHROMAX 600 MG TABS (AZITHROMYCIN) take two tabets weekly;  #8 x 11;  Signed;  Entered by: Acey Lav MD;  Authorized by: Paulette Blanch Dam MD;  Method used: Print then Give to Patient    Prescriptions: ZITHROMAX 600 MG TABS (AZITHROMYCIN) take two tabets weekly  #8 x 11   Entered and Authorized by:   Acey Lav MD   Signed by:   Paulette Blanch Dam MD on 12/21/2009   Method used:   Print then Give to Patient   RxID:   [id number] PREDNISONE 20 MG TABS  (PREDNISONE) take two tablets twice daily thru 10/8, then two tablets once daily thru 10/13, then on tab daily thru 10/23  #33 x 0   Entered and Authorized by:   Acey Lav MD   Signed by:   Paulette Blanch Dam MD on 12/21/2009   Method used:   Print then Give to Patient   RxID:   [id number] SULFAMETHOXAZOLE-TRIMETHOPRIM 400-80 MG TABS (SULFAMETHOXAZOLE-TRIMETHOPRIM) two tablets three times daily until October 23rd, then one tablet daily  #60 x 11   Entered and Authorized by:   Acey Lav MD   Signed by:   Paulette Blanch Dam MD on 12/21/2009   Method used:   Print then Give to Patient   RxID:   [id number] ATRIPLA 600-200-300 MG TABS (EFAVIRENZ-EMTRICITAB-TENOFOVIR) Take 1 tab by mouth at bedtime  #30 x 11   Entered and Authorized by:   Acey Lav MD   Signed by:   Paulette Blanch Dam MD on 12/21/2009   Method used:   Print then Give to Patient   RxID:   [id number]

## 2010-04-19 NOTE — Assessment & Plan Note (Addendum)
Summary: [name]  Anticoagulant Therapy Managed by: Barbera Setters. Janie Morning  PharmD CACP PCP: Dr. Daiva Eves [facility] Attending: Margarito Liner MD Indication 1: Pulmonary  embolus Indication 2: Encounter for therapeutic drug monitoring  V58.83 Start date: 12/16/2009 Duration: 6 months  Patient Assessment Reviewed by: Chancy Milroy PharmD  February 26, 2010 Medication review: verified warfarin dosage & schedule,verified previous prescription medications, verified doses & any changes, verified new medications, reviewed OTC medications, reviewed OTC health products-vitamins supplements etc Complications: none Dietary changes: none   Health status changes: none   Lifestyle changes: none   Recent/future hospitalizations: none   Recent/future procedures: none   Recent/future dental: none Patient Assessment Part 2:  Have you MISSED ANY DOSES or CHANGED TABLETS?  No missed Warfarin doses or changed tablets.  Have you had any BRUISING or BLEEDING ( nose or gum bleeds,blood in urine or stool)?  No reported bruising or bleeding in nose, gums, urine, stool.  Have you STARTED or STOPPED any MEDICATIONS, including OTC meds,herbals or supplements?  No other medications or herbal supplements were started or stopped.  Have you CHANGED your DIET, especially green vegetables,or ALCOHOL intake?  No changes in diet or alcohol intake.  Have you had any ILLNESSES or HOSPITALIZATIONS?  No reported illnesses or hospitalizations  Have you had any signs of CLOTTING?(chest discomfort,dizziness,shortness of breath,arms tingling,slurred speech,swelling or redness in leg)    No chest discomfort, dizziness, shortness of breath, tingling in arm, slurred speech, swelling, or redness in leg.     Treatment  Target INR: 2.0-3.0 INR: 2.8  Date: 02/26/2010 Regimen In:  52.5mg /week INR reflects regimen in: 2.8  New  Tablet strength: : 5mg  Regimen Out:     Monday: 1 Tablet    Total Weekly: 50mg /wk mg  Next INR Due:  03/19/2010 Adjusted by: Barbera Setters. Alexandria Lodge [name] PharmD CACP   Return to anticoagulation clinic:  03/19/2010 Time of next visit: 1130    Allergies: No Known Drug Allergies

## 2010-04-19 NOTE — Progress Notes (Signed)
  Phone Note Outgoing Call   Call placed by: Golden Circle RN,  April 10, 2010 1:42 PM Call placed to: pt Action Taken: Assistance medications ready for pick up    Prescriptions: SULFAMETHOXAZOLE-TRIMETHOPRIM 400-80 MG TABS (SULFAMETHOXAZOLE-TRIMETHOPRIM) Take 1 tablet by mouth once a day  #30 x 0   Entered by:   Golden Circle RN   Authorized by:   Paulette Blanch Dam MD   Signed by:   Golden Circle RN on 04/10/2010   Method used:   Samples Given   RxID:   [id number] ATRIPLA 600-200-300 MG TABS (EFAVIRENZ-EMTRICITAB-TENOFOVIR) Take 1 tab by mouth at bedtime  #30 x 0   Entered by:   Golden Circle RN   Authorized by:   Paulette Blanch Dam MD   Signed by:   Golden Circle RN on 04/10/2010   Method used:   Samples Given   RxID:   [id number] ZITHROMAX 600 MG TABS (AZITHROMYCIN) take two tabets weekly  #8 x 0   Entered by:   Golden Circle RN   Authorized by:   Paulette Blanch Dam MD   Signed by:   Golden Circle RN on 04/10/2010   Method used:   Samples Given   RxID:   [id number]  LM that meds are ready to be picked up.Marland KitchenMarland KitchenGolden Circle RN  April 10, 2010 1:44 PM

## 2010-04-19 NOTE — Assessment & Plan Note (Signed)
Summary: [name]  Anticoagulant Therapy Managed by: Barbera Setters. Janie Morning  PharmD CACP PCP: Dr. Daiva Eves [facility] Attending: Reche Dixon MD, [name] Indication 1: Pulmonary  embolus Indication 2: Encounter for therapeutic drug monitoring  V58.83 Start date: 12/16/2009 Duration: 6 months  Patient Assessment Reviewed by: Chancy Milroy PharmD  March 19, 2010 Medication review: verified warfarin dosage & schedule,verified previous prescription medications, verified doses & any changes, verified new medications, reviewed OTC medications, reviewed OTC health products-vitamins supplements etc Complications: none Dietary changes: none   Health status changes: none   Lifestyle changes: none   Recent/future hospitalizations: none   Recent/future procedures: none   Recent/future dental: none Patient Assessment Part 2:  Have you MISSED ANY DOSES or CHANGED TABLETS?  No missed Warfarin doses or changed tablets.  Have you had any BRUISING or BLEEDING ( nose or gum bleeds,blood in urine or stool)?  No reported bruising or bleeding in nose, gums, urine, stool.  Have you STARTED or STOPPED any MEDICATIONS, including OTC meds,herbals or supplements?  No other medications or herbal supplements were started or stopped.  Have you CHANGED your DIET, especially green vegetables,or ALCOHOL intake?  No changes in diet or alcohol intake.  Have you had any ILLNESSES or HOSPITALIZATIONS?  No reported illnesses or hospitalizations  Have you had any signs of CLOTTING?(chest discomfort,dizziness,shortness of breath,arms tingling,slurred speech,swelling or redness in leg)    No chest discomfort, dizziness, shortness of breath, tingling in arm, slurred speech, swelling, or redness in leg.     Treatment  Target INR: 2.0-3.0 INR: 2.5  Date: 03/19/2010 Regimen In:  50mg /wk INR reflects regimen in: 2.5  New  Tablet strength: : 5mg  Regimen Out:     Sunday: 1 & 1/2 Tablet     Monday: 1 & 1/2 Tablet     Tuesday: 1 & 1/2  Tablet     Wednesday: 1 & 1/2 Tablet     Thursday: 1 & 1/2 Tablet      Friday: 1 & 1/2 Tablet     Saturday: 1 & 1/2 Tablet Total Weekly: 52.5mg /week mg  Next INR Due: 04/16/2010 Adjusted by: Barbera Setters. Alexandria Lodge [name] PharmD CACP   Return to anticoagulation clinic:  04/16/2010 Time of next visit: 1145    Allergies: No Known Drug Allergies

## 2010-04-19 NOTE — Miscellaneous (Signed)
Summary: RW Update  Clinical Lists Changes  Observations: Added new observation of RWPARTICIP: Yes (03/23/2010 10:20) 

## 2010-04-19 NOTE — Assessment & Plan Note (Addendum)
Summary: F/U [MKJ]   Visit Type:  Follow-up Referring Provider:  Dr. Bascom Levels Primary Provider:  Dr. Daiva Eves  CC:  f/u .  History of Present Illness: 30 year old African American whom I met this fall with  Pulmoanry embolism and PCP pneumonia and newly diagnosied HIV, AIDS. HIs cd4<10 and his viral load in hudrends of thousands. His Hep A, B sag, sab, C abs were negative and his rpr negative in screenign. He was started on atripla as inpatient. Genotgype prior to starting ARV showed one etravirine mutaiton but full susceptiblity to [place]. He has fully suppressed his virus on atripla and reconstituted his cd4 count to 60. He has no specific complaints today though he was dishearted to see his CD4 count not rise as much as he would have liked.   Preventive Screening-Counseling & Management  Alcohol-Tobacco     Alcohol drinks/day: 1     Smoking Status: never   Current Allergies (reviewed today): No known allergies  Past History:  Past Medical History: Last updated: 01/01/2010 AIDS Sepember 2011 PCP pneumonia 2011 Pulmonary embolism Anxiety  Family History: Last updated: 01/01/2010 noncontribitory  Social History: Last updated: 01/01/2010 risk for HIV, MSM, rare alcohol  Risk Factors: Alcohol Use: 1 (04/11/2010)  Risk Factors: Smoking Status: never (04/11/2010)  Family History: Reviewed history from 01/01/2010 and no changes required. noncontribitory  Social History: Reviewed history from 01/01/2010 and no changes required. risk for HIV, MSM, rare alcohol  Review of Systems       see HPI otherwise negative on 12 pt review  Vital Signs:  Patient profile:   30 year old male Height:      68 inches (172.72 cm) Weight:      193 pounds (87.73 kg) BMI:     29.45 Temp:     98.5 degrees F (36.94 degrees C) oral Pulse rate:   71 / minute BP sitting:   156 / 85  (left arm)  Vitals Entered By: Starleen Arms CMA (April 11, 2010 10:44 AM) CC: f/u  Is  Patient Diabetic? No Pain Assessment Patient in pain? no      Nutritional Status BMI of 25 - 29 = overweight Nutritional Status Detail nl  Does patient need assistance? Functional Status Self care Ambulation Normal   Physical Exam  General:  alert and well-nourished.  anxioius and with partner who was loudly and impatiently sighing initially Head:  normocephalic and atraumatic.   Eyes:  vision grossly intact, pupils equal, pupils round, and pupils reactive to light.   Ears:  no external deformities.   Mouth:  pharynx pink and moist, no erythema, and no exudates.  no thrush Lungs:  normal respiratory effort, no crackles, and no wheezes.   Heart:  normal rate, regular rhythm, no murmur, no gallop, and no rub.   Abdomen:  soft, non-tender, normal bowel sounds, and no distention.   Msk:  normal ROM and no joint tenderness.   Extremities:  No clubbing, cyanosis, edema, or deformity noted with normal full range of motion of all joints.   Neurologic:  alert & oriented X3, strength normal in all extremities, and gait normal.   Skin:  color normal, no rashes, and no suspicious lesions.   Psych:  Oriented X3, memory intact for recent and remote, dysphoric affect, and depressed affect.          Medication Adherence: 04/11/2010   Adherence to medications reviewed with patient. Counseling to provide adequate adherence provided   Prevention For Positives:  04/11/2010   Safe sex practices discussed with patient. Condoms offered.                             Impression & Recommendations:  Problem # 1:  AIDS (ICD-042) Excellent control. I will also check toxo igg and check a g6pd and consider switch to dapsone for prophlaxis in case the TMPSMX is impeding the bone marrow His updated medication list for this problem includes:    Sulfamethoxazole-trimethoprim 400-80 Mg Tabs (Sulfamethoxazole-trimethoprim) .Marland Kitchen... Take 1 tablet by mouth once a day    Zithromax 600 Mg Tabs (Azithromycin)  .Marland Kitchen... Take two tabets weekly  Orders: Est. Patient Level IV (99214)Future Orders: T-Toxoplasma Antibody IgG ([id number]) ... 04/16/2010 T-Assay of G6PD Enzyme [phone]) ... 04/16/2010  Problem # 2:  PE (ICD-415.19)  on coumadin go get INR on Monday His updated medication list for this problem includes:    Coumadin 5 Mg Tabs (Warfarin sodium) .Marland Kitchen... Take as directed  Orders: Est. 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 Patient Level IV (84132)  Other Orders: T-RPR (Syphilis) ([id number]) Hepatitis A Vaccine (Adult Dose) (66440) Admin 1st Vaccine (34742) Hepatitis B Vaccine >74yrs (59563) Admin of Any Addtl Vaccine (87564) Future Orders: T-CD4SP ([facility]) (CD4SP) ... 05/23/2010 T-HIV Viral Load [phone]) ... 05/23/2010 T-CBC w/Diff ([id number]) ... 05/23/2010 T-Comprehensive Metabolic Panel [phone]) ... 05/23/2010 T-Protime, Auto ([id number]) ... 04/16/2010  Patient Instructions: 1)  GREAT JOB! 2)  rTC TO see Dr. Daiva Eves in March 3)  Be sure to return for lab work one (1) week before your next appointment as scheduled.    Immunizations Administered:  Hepatitis A Vaccine # 2:    Vaccine Type: HepA    Site: right deltoid    Mfr: GlaxoSmithKline    Dose: 0.5 ml    Route: IM    Given by: Starleen Arms CMA    Exp. Date: 02/29/2012    Lot #: [id number]    VIS given: 06/05/04 version given April 11, 2010.  Hepatitis B Vaccine # 2:    Vaccine Type: HepB Adult    Site: right deltoid    Mfr: Merck    Dose: 0.5 ml    Route: IM    Given by: Starleen Arms CMA    Exp. Date: 12/29/2011    Lot #: [id number]    VIS given: 10/02/05 version given April 11, 2010.

## 2010-04-25 NOTE — Assessment & Plan Note (Signed)
Summary: [name]  Anticoagulant Therapy [facility] Attending: Lowella Bandy MD Indication 1: Pulmonary  embolus Indication 2: Encounter for therapeutic drug monitoring  V58.83 Start date: 12/25/2009 Duration: 6 months  Patient Assessment Reviewed by: Chancy Milroy PharmD  April 17, 2010 Medication review: verified warfarin dosage & schedule,verified previous prescription medications, verified doses & any changes, verified new medications, reviewed OTC medications, reviewed OTC health products-vitamins supplements etc Complications: none Dietary changes: none   Health status changes: none   Lifestyle changes: none   Recent/future hospitalizations: none   Recent/future procedures: none   Recent/future dental: none Patient Assessment Part 2:  Have you MISSED ANY DOSES or CHANGED TABLETS?  No missed Warfarin doses or changed tablets.  Have you had any BRUISING or BLEEDING ( nose or gum bleeds,blood in urine or stool)?  No reported bruising or bleeding in nose, gums, urine, stool.  Have you STARTED or STOPPED any MEDICATIONS, including OTC meds,herbals or supplements?  No other medications or herbal supplements were started or stopped.  Have you CHANGED your DIET, especially green vegetables,or ALCOHOL intake?  No changes in diet or alcohol intake.  Have you had any ILLNESSES or HOSPITALIZATIONS?  No reported illnesses or hospitalizations  Have you had any signs of CLOTTING?(chest discomfort,dizziness,shortness of breath,arms tingling,slurred speech,swelling or redness in leg)    No chest discomfort, dizziness, shortness of breath, tingling in arm, slurred speech, swelling, or redness in leg.     Treatment  Target INR: 2.0-3.0 INR: 3.3  Date: 12/25/2009 INR reflects regimen in: 3.3  New  Tablet strength: : 5mg  Regimen Out:     Sunday: 1 Tablet     Monday: 0 Tablet     Tuesday: 1 Tablet     Wednesday: 1/2 Tablet     Thursday: 1 Tablet      Friday: 0 Tablet     Saturday: 1  Tablet Total Weekly: 22.5mg /week mg  Next INR Due: 01/01/2010 Adjusted by: Barbera Setters. Alexandria Lodge [name] PharmD CACP   Return to anticoagulation clinic:  01/01/2010 Time of next visit: 1100

## 2010-04-25 NOTE — Assessment & Plan Note (Signed)
Summary: [name]  Anticoagulant Therapy Managed by: Barbera Setters. Janie Morning  PharmD CACP PCP: Dr. Daiva Eves [facility] Attending: Reche Dixon MD, Onalee Hua Indication 1: Pulmonary  embolus Indication 2: Encounter for therapeutic drug monitoring  V58.83 Start date: 12/16/2009 Duration: 6 months  Patient Assessment Reviewed by: Chancy Milroy PharmD  April 16, 2010 Medication review: verified warfarin dosage & schedule,verified previous prescription medications, verified doses & any changes, verified new medications, reviewed OTC medications, reviewed OTC health products-vitamins supplements etc Complications: none Dietary changes: none   Health status changes: none   Lifestyle changes: none   Recent/future hospitalizations: none   Recent/future procedures: none   Recent/future dental: none Patient Assessment Part 2:  Have you MISSED ANY DOSES or CHANGED TABLETS?  No missed Warfarin doses or changed tablets.  Have you had any BRUISING or BLEEDING ( nose or gum bleeds,blood in urine or stool)?  No reported bruising or bleeding in nose, gums, urine, stool.  Have you STARTED or STOPPED any MEDICATIONS, including OTC meds,herbals or supplements?  No other medications or herbal supplements were started or stopped.  Have you CHANGED your DIET, especially green vegetables,or ALCOHOL intake?  No changes in diet or alcohol intake.  Have you had any ILLNESSES or HOSPITALIZATIONS?  No reported illnesses or hospitalizations  Have you had any signs of CLOTTING?(chest discomfort,dizziness,shortness of breath,arms tingling,slurred speech,swelling or redness in leg)    No chest discomfort, dizziness, shortness of breath, tingling in arm, slurred speech, swelling, or redness in leg.     Treatment  Target INR: 2.0-3.0 INR: 3.90  Date: 04/16/2010 Regimen In:  52.5mg /week INR reflects regimen in: 3.90  New  Tablet strength: : 5mg  Regimen Out:     Sunday: 1 & 1/2 Tablet     Monday: 1 Tablet     Tuesday: 1 & 1/2  Tablet     Wednesday: 1 & 1/2 Tablet     Thursday: 1 Tablet      Friday: 1 & 1/2 Tablet     Saturday: 1 & 1/2 Tablet Total Weekly: 47.5mg /week mg  Next INR Due: 04/30/2010 Adjusted by: Barbera Setters. Alexandria Lodge [name] PharmD CACP   Return to anticoagulation clinic:  04/30/2010 Time of next visit: 1130    Allergies: No Known Drug Allergies

## 2010-04-27 ENCOUNTER — Encounter (INDEPENDENT_AMBULATORY_CARE_PROVIDER_SITE_OTHER): Payer: Self-pay | Admitting: *Deleted

## 2010-04-30 ENCOUNTER — Ambulatory Visit (INDEPENDENT_AMBULATORY_CARE_PROVIDER_SITE_OTHER): Payer: Medicaid Other | Admitting: Pharmacist

## 2010-04-30 ENCOUNTER — Ambulatory Visit: Payer: Self-pay | Admitting: Pharmacist

## 2010-04-30 DIAGNOSIS — Z7901 Long term (current) use of anticoagulants: Secondary | ICD-10-CM

## 2010-04-30 DIAGNOSIS — I2699 Other pulmonary embolism without acute cor pulmonale: Secondary | ICD-10-CM

## 2010-04-30 DIAGNOSIS — I82409 Acute embolism and thrombosis of unspecified deep veins of unspecified lower extremity: Secondary | ICD-10-CM

## 2010-04-30 LAB — POCT INR: INR: 3.1

## 2010-04-30 NOTE — Progress Notes (Signed)
Reviewed patient's PT/INR results and agree with treatment plan.  

## 2010-04-30 NOTE — Patient Instructions (Signed)
Patient instructed to take medications as defined in the Anti-coagulation Track section of this encounter.  Patient instructed to take today's dose.  Patient verbalized understanding of these instructions.    

## 2010-04-30 NOTE — Progress Notes (Signed)
Anti-Coagulation Progress Note  [name] is a 30 y.o. male who is currently on an anti-coagulation regimen.    RECENT RESULTS: Recent results are below, the most recent result is correlated with a dose of 47.5 mg. per week: Lab Results  Component Value Date   INR 3.1 04/30/2010   INR 3.90 04/16/2010   INR 2.5 03/19/2010    ANTI-COAG DOSE:   Latest dosing instructions   Total Sun Mon Tue Wed Thu Fri Sat   45 7.5 mg 5 mg 7.5 mg 5 mg 7.5 mg 5 mg 7.5 mg    (5 mg1.5) (5 mg1) (5 mg1.5) (5 mg1) (5 mg1.5) (5 mg1) (5 mg1.5)         ANTICOAG SUMMARY: Anticoagulation Episode Summary              Current INR goal  Next INR check 06/04/2010   INR from last check 3.1 (04/30/2010)     Weekly max dose (mg)  Target end date 12/17/2010   Indications PE, DVT, Long term current use of anticoagulant   INR check location  Preferred lab    Send INR reminders to [facility]   Comments        Provider Role Specialty Phone number   [facility] [place]  Infectious Diseases [phone]        ANTICOAG TODAY: Anticoagulation Summary as of 04/30/2010              INR goal      Selected INR 3.1 (04/30/2010) Next INR check 06/04/2010   Weekly max dose (mg)  Target end date 12/17/2010   Indications PE, DVT, Long term current use of anticoagulant    Anticoagulation Episode Summary              INR check location  Preferred lab    Send INR reminders to [facility]   Comments        Provider Role Specialty Phone number   Paulette Blanch [facility]  Infectious Diseases [phone]        PATIENT INSTRUCTIONS: Patient Instructions  Patient instructed to take medications as defined in the Anti-coagulation Track section of this encounter.  Patient instructed to take today's dose.  Patient verbalized understanding of these instructions.        FOLLOW-UP Return in 5 weeks (on 06/04/2010) for Follow up INR.  [name], [name] Pharm.D., CACP

## 2010-05-02 ENCOUNTER — Encounter (INDEPENDENT_AMBULATORY_CARE_PROVIDER_SITE_OTHER): Payer: Self-pay | Admitting: *Deleted

## 2010-05-03 NOTE — Miscellaneous (Signed)
  Clinical Lists Changes  Observations: Added new observation of LATINO/HISP: No (04/27/2010 12:23) Added new observation of RACE: African American (04/27/2010 12:23)

## 2010-05-04 ENCOUNTER — Telehealth (INDEPENDENT_AMBULATORY_CARE_PROVIDER_SITE_OTHER): Payer: Self-pay | Admitting: Licensed Clinical Social Worker

## 2010-05-09 ENCOUNTER — Encounter (INDEPENDENT_AMBULATORY_CARE_PROVIDER_SITE_OTHER): Payer: Self-pay | Admitting: *Deleted

## 2010-05-09 NOTE — Miscellaneous (Signed)
Summary: Rw update  Clinical Lists Changes  Observations: Added new observation of MARITAL STAT: Single (05/02/2010 14:18)

## 2010-05-09 NOTE — Progress Notes (Signed)
Summary: ADAP medications arrived  Phone Note Outgoing Call   Summary of Call: ADAP medications arrived, patient notified. Initial call taken by: Starleen Arms CMA,  May 04, 2010 4:25 PM Call placed by: Starleen Arms CMA,  May 04, 2010 4:25 PM Call placed to: Patient Summary of Call: Patient's ADAP medications arrived, patient notified Initial call taken by: Starleen Arms CMA,  May 04, 2010 4:26 PM

## 2010-05-15 NOTE — Miscellaneous (Signed)
  Clinical Lists Changes  Observations: Added new observation of HIV RISK BEH: MSM (05/09/2010 16:59)

## 2010-05-21 ENCOUNTER — Ambulatory Visit (INDEPENDENT_AMBULATORY_CARE_PROVIDER_SITE_OTHER): Payer: Medicaid Other | Admitting: Pharmacist

## 2010-05-21 DIAGNOSIS — I82409 Acute embolism and thrombosis of unspecified deep veins of unspecified lower extremity: Secondary | ICD-10-CM

## 2010-05-21 DIAGNOSIS — I2699 Other pulmonary embolism without acute cor pulmonale: Secondary | ICD-10-CM

## 2010-05-21 DIAGNOSIS — Z7901 Long term (current) use of anticoagulants: Secondary | ICD-10-CM

## 2010-05-21 LAB — POCT INR: INR: 3.2

## 2010-05-21 NOTE — Progress Notes (Signed)
Anti-Coagulation Progress Note  JAYVION STEFANSKI is a 30 y.o. male who is currently on an anti-coagulation regimen.    RECENT RESULTS: Recent results are below, the most recent result is correlated with a dose of 45 mg. per week: Lab Results  Component Value Date   INR 3.2 05/21/2010   INR 3.1 04/30/2010   INR 3.90 04/16/2010    ANTI-COAG DOSE:   Latest dosing instructions   Total Sun Mon Tue Wed Thu Fri Sat   45 7.5 mg 5 mg 7.5 mg 5 mg 7.5 mg 5 mg 7.5 mg    (5 mg1.5) (5 mg1) (5 mg1.5) (5 mg1) (5 mg1.5) (5 mg1) (5 mg1.5)         ANTICOAG SUMMARY: Anticoagulation Episode Summary              Current INR goal 2.0-3.0 Next INR check 06/04/2010   INR from last check 3.1 (04/30/2010) Most recent INR 3.2! (05/21/2010)   Weekly max dose (mg)  Target end date 06/18/2010   Indications PE, DVT, Long term current use of anticoagulant   INR check location [facility] Preferred lab    Send INR reminders to [facility]   Comments        Provider Role Specialty Phone number   [facility] [place]  Infectious Diseases [phone]        ANTICOAG TODAY: Anticoagulation Summary as of 05/21/2010              INR goal 2.0-3.0     Selected INR  Next INR check    Weekly max dose (mg)  Target end date 06/18/2010   Indications PE, DVT, Long term current use of anticoagulant    Anticoagulation Episode Summary              INR check location [facility] Preferred lab    Send INR reminders to [facility]   Comments        Provider Role Specialty Phone number   Paulette Blanch [facility]  Infectious Diseases [phone]        PATIENT INSTRUCTIONS: Patient Instructions  Patient instructed to take medications as defined in the Anti-coagulation Track section of this encounter.  Patient instructed to take today's dose.  Patient verbalized understanding of these instructions.        FOLLOW-UP Return in 4 weeks (on 06/18/2010) for Follow up INR.  [name], [name] Pharm.D., CACP

## 2010-05-21 NOTE — Patient Instructions (Signed)
Patient instructed to take medications as defined in the Anti-coagulation Track section of this encounter.  Patient instructed to take today's dose.  Patient verbalized understanding of these instructions.    

## 2010-05-22 ENCOUNTER — Encounter (INDEPENDENT_AMBULATORY_CARE_PROVIDER_SITE_OTHER): Payer: Self-pay | Admitting: *Deleted

## 2010-05-29 LAB — T-HELPER CELL (CD4) - (RCID CLINIC ONLY)
CD4 % Helper T Cell: 2 % — ABNORMAL LOW (ref 33–55)
CD4 T Cell Abs: 40 uL — ABNORMAL LOW (ref 400–2700)

## 2010-05-29 NOTE — Miscellaneous (Signed)
  Clinical Lists Changes  Observations: Added new observation of GENDER: Male (05/22/2010 14:23)

## 2010-05-30 LAB — COMPREHENSIVE METABOLIC PANEL
ALT: 99 U/L — ABNORMAL HIGH (ref 0–53)
AST: 75 U/L — ABNORMAL HIGH (ref 0–37)
Albumin: 2.3 g/dL — ABNORMAL LOW (ref 3.5–5.2)
Alkaline Phosphatase: 107 U/L (ref 39–117)
BUN: 6 mg/dL (ref 6–23)
CO2: 23 mEq/L (ref 19–32)
Calcium: 8.4 mg/dL (ref 8.4–10.5)
Chloride: 101 mEq/L (ref 96–112)
Creatinine, Ser: 0.94 mg/dL (ref 0.4–1.5)
GFR calc Af Amer: >60 mL/min (ref >60)
GFR calc non Af Amer: >60 mL/min (ref >60)
Glucose, Bld: 107 mg/dL — ABNORMAL HIGH (ref 70–99)
Potassium: 3.7 mEq/L (ref 3.5–5.1)
Sodium: 134 mEq/L — ABNORMAL LOW (ref 135–145)
Total Bilirubin: 0.6 mg/dL (ref 0.3–1.2)
Total Protein: 7.3 g/dL (ref 6.0–8.3)

## 2010-05-30 LAB — BASIC METABOLIC PANEL
BUN: 2 mg/dL — ABNORMAL LOW (ref 6–23)
BUN: 3 mg/dL — ABNORMAL LOW (ref 6–23)
CO2: 19 mEq/L (ref 19–32)
Chloride: 107 mEq/L (ref 96–112)
Chloride: 107 mEq/L (ref 96–112)
Creatinine, Ser: 1.05 mg/dL (ref 0.4–1.5)
GFR calc non Af Amer: >60 mL/min (ref >60)
Glucose, Bld: 73 mg/dL (ref 70–99)
Glucose, Bld: 87 mg/dL (ref 70–99)
Potassium: 3.9 mEq/L (ref 3.5–5.1)
Potassium: 3.9 mEq/L (ref 3.5–5.1)
Potassium: 4.5 mEq/L (ref 3.5–5.1)
Sodium: 135 mEq/L (ref 135–145)

## 2010-05-30 LAB — CBC
HCT: 30.2 % — ABNORMAL LOW (ref 39.0–52.0)
HCT: 31.6 % — ABNORMAL LOW (ref 39.0–52.0)
HCT: 33.1 % — ABNORMAL LOW (ref 39.0–52.0)
HCT: 33.9 % — ABNORMAL LOW (ref 39.0–52.0)
HCT: 34.7 % — ABNORMAL LOW (ref 39.0–52.0)
Hemoglobin: 10.8 g/dL — ABNORMAL LOW (ref 13.0–17.0)
Hemoglobin: 11.2 g/dL — ABNORMAL LOW (ref 13.0–17.0)
Hemoglobin: 11.5 g/dL — ABNORMAL LOW (ref 13.0–17.0)
MCH: 26 pg (ref 26.0–34.0)
MCH: 26.4 pg (ref 26.0–34.0)
MCH: 26.9 pg (ref 26.0–34.0)
MCHC: 32.6 g/dL (ref 30.0–36.0)
MCHC: 32.8 g/dL (ref 30.0–36.0)
MCHC: 33.1 g/dL (ref 30.0–36.0)
MCV: 78.4 fL (ref 78.0–100.0)
MCV: 80.5 fL (ref 78.0–100.0)
MCV: 81.1 fL (ref 78.0–100.0)
MCV: 81.2 fL (ref 78.0–100.0)
Platelets: 323 10*3/uL (ref 150–400)
Platelets: 332 10*3/uL (ref 150–400)
Platelets: 389 10*3/uL (ref 150–400)
RBC: 4.28 MIL/uL (ref 4.22–5.81)
RDW: 13.4 % (ref 11.5–15.5)
RDW: 13.5 % (ref 11.5–15.5)
RDW: 13.6 % (ref 11.5–15.5)
RDW: 13.8 % (ref 11.5–15.5)
RDW: 14 % (ref 11.5–15.5)
WBC: 3 10*3/uL — ABNORMAL LOW (ref 4.0–10.5)
WBC: 4.3 10*3/uL (ref 4.0–10.5)
WBC: 4.7 10*3/uL (ref 4.0–10.5)
WBC: 5.7 10*3/uL (ref 4.0–10.5)
WBC: 7.2 10*3/uL (ref 4.0–10.5)

## 2010-05-30 LAB — HIV-1 GENOTYPR PLUS

## 2010-05-30 LAB — PROTIME-INR
INR: 1.79 — ABNORMAL HIGH (ref 0.00–1.49)
INR: 2.01 — ABNORMAL HIGH (ref 0.00–1.49)
INR: 2.3 — ABNORMAL HIGH (ref 0.00–1.49)
INR: 3.03 — ABNORMAL HIGH (ref 0.00–1.49)
Prothrombin Time: 16 seconds — ABNORMAL HIGH (ref 11.6–15.2)
Prothrombin Time: 21 seconds — ABNORMAL HIGH (ref 11.6–15.2)

## 2010-05-30 LAB — HEPATITIS PANEL, ACUTE
Hep A IgM: NEGATIVE
Hep B C IgM: NEGATIVE

## 2010-05-30 LAB — CULTURE, BLOOD (ROUTINE X 2): Culture: NO GROWTH

## 2010-05-30 LAB — DIFFERENTIAL
Basophils Absolute: 0 10*3/uL (ref 0.0–0.1)
Basophils Relative: 0 % (ref 0–1)
Basophils Relative: 0 % (ref 0–1)
Eosinophils Absolute: 0.1 10*3/uL (ref 0.0–0.7)
Eosinophils Absolute: 0.2 10*3/uL (ref 0.0–0.7)
Eosinophils Relative: 2 % (ref 0–5)
Eosinophils Relative: 3 % (ref 0–5)
Lymphs Abs: 0.7 10*3/uL (ref 0.7–4.0)
Monocytes Absolute: 0.5 10*3/uL (ref 0.1–1.0)
Neutrophils Relative %: 77 % (ref 43–77)

## 2010-05-30 LAB — IRON AND TIBC
Saturation Ratios: 10 % — ABNORMAL LOW (ref 20–55)
Saturation Ratios: 16 % — ABNORMAL LOW (ref 20–55)

## 2010-05-30 LAB — FOLATE: Folate: 3 ng/mL — ABNORMAL LOW

## 2010-05-30 LAB — HIV ANTIBODY (ROUTINE TESTING W REFLEX): HIV: REACTIVE — AB

## 2010-05-30 LAB — VANCOMYCIN, TROUGH: Vancomycin Tr: 18 ug/mL (ref 10.0–20.0)

## 2010-05-30 LAB — T-HELPER CELL (CD4) - (RCID CLINIC ONLY)
CD4 % Helper T Cell: 1 % — ABNORMAL LOW (ref 33–55)
CD4 T Cell Abs: 10 uL — ABNORMAL LOW (ref 400–2700)

## 2010-05-30 LAB — HIV-1 RNA ULTRAQUANT REFLEX TO GENTYP+
HIV 1 RNA Quant: 61500 copies/mL — ABNORMAL HIGH (ref <20)
HIV-1 RNA Quant, Log: 4.79 {Log} — ABNORMAL HIGH (ref <1.30)

## 2010-05-30 LAB — RETICULOCYTES: Retic Ct Pct: 1.1 % (ref 0.4–3.1)

## 2010-05-30 LAB — HIV 1/2 CONFIRMATION: HIV-2 Ab: NEGATIVE

## 2010-05-30 LAB — HEMOCCULT GUIAC POC 1CARD (OFFICE): Fecal Occult Bld: POSITIVE

## 2010-05-30 LAB — MRSA PCR SCREENING: MRSA by PCR: NEGATIVE

## 2010-05-31 LAB — CBC
MCH: 27.3 pg (ref 26.0–34.0)
Platelets: ADEQUATE 10*3/uL (ref 150–400)
RBC: 4.66 MIL/uL (ref 4.22–5.81)
RDW: 12.6 % (ref 11.5–15.5)

## 2010-05-31 LAB — DIFFERENTIAL
Basophils Absolute: 0 10*3/uL (ref 0.0–0.1)
Eosinophils Absolute: 0.2 10*3/uL (ref 0.0–0.7)
Eosinophils Relative: 2 % (ref 0–5)
Monocytes Absolute: 0.9 10*3/uL (ref 0.1–1.0)
Neutrophils Relative %: 74 % (ref 43–77)

## 2010-05-31 LAB — PROTIME-INR
INR: 1.12 (ref 0.00–1.49)
Prothrombin Time: 14.6 seconds (ref 11.6–15.2)

## 2010-05-31 LAB — BASIC METABOLIC PANEL
Chloride: 98 mEq/L (ref 96–112)
Creatinine, Ser: 1 mg/dL (ref 0.4–1.5)
GFR calc Af Amer: >60 mL/min (ref >60)
Potassium: 4.2 mEq/L (ref 3.5–5.1)

## 2010-05-31 LAB — LACTATE DEHYDROGENASE: LDH: 336 U/L — ABNORMAL HIGH (ref 94–250)

## 2010-06-04 ENCOUNTER — Ambulatory Visit: Payer: Medicaid Other

## 2010-06-18 ENCOUNTER — Ambulatory Visit (INDEPENDENT_AMBULATORY_CARE_PROVIDER_SITE_OTHER): Payer: Medicaid Other | Admitting: Pharmacist

## 2010-06-18 DIAGNOSIS — I2699 Other pulmonary embolism without acute cor pulmonale: Secondary | ICD-10-CM

## 2010-06-18 DIAGNOSIS — Z7901 Long term (current) use of anticoagulants: Secondary | ICD-10-CM

## 2010-06-18 DIAGNOSIS — I82409 Acute embolism and thrombosis of unspecified deep veins of unspecified lower extremity: Secondary | ICD-10-CM

## 2010-06-18 NOTE — Progress Notes (Signed)
Anti-Coagulation Progress Note  Lance Craig is a 30 y.o. male who is currently on an anti-coagulation regimen.    RECENT RESULTS: Recent results are below, the most recent result is correlated with a dose of 42.5 mg. per week: Lab Results  Component Value Date   INR 2.2 06/18/2010   INR 3.2 05/21/2010   INR 3.1 04/30/2010    ANTI-COAG DOSE:   Latest dosing instructions   Total Sun Mon Tue Wed Thu Fri Sat   42.5 5 mg 7.5 mg 5 mg 7.5 mg 5 mg 7.5 mg 5 mg    (5 mg1) (5 mg1.5) (5 mg1) (5 mg1.5) (5 mg1) (5 mg1.5) (5 mg1)         ANTICOAG SUMMARY: Anticoagulation Episode Summary              Current INR goal 2.0-3.0 Next INR check 07/16/2010   INR from last check 2.2 (06/18/2010)     Weekly max dose (mg)  Target end date 06/18/2010   Indications PE, DVT, Long term current use of anticoagulant   INR check location [facility] Preferred lab    Send INR reminders to [facility]   Comments        Provider Role Specialty Phone number   Acey Lav, MD  Infectious Diseases [phone]        ANTICOAG TODAY: Anticoagulation Summary as of 06/18/2010              INR goal 2.0-3.0     Selected INR 2.2 (06/18/2010) Next INR check 07/16/2010   Weekly max dose (mg)  Target end date 06/18/2010   Indications PE, DVT, Long term current use of anticoagulant    Anticoagulation Episode Summary              INR check location [facility] Preferred lab    Send INR reminders to [facility]   Comments        Provider Role Specialty Phone number   Acey Lav, MD  Infectious Diseases [phone]        PATIENT INSTRUCTIONS: Patient Instructions  Patient instructed to take medications as defined in the Anti-coagulation Track section of this encounter.  Patient instructed to take today's dose.  Patient verbalized understanding of these instructions.  Patient has been instructed to discuss with Dr. Daiva Eves the possibility of discontinuation of warfarin.        FOLLOW-UP Return in 4 weeks (on 07/16/2010) for Follow up INR.  [name], [name] Pharm.D., CACP

## 2010-06-18 NOTE — Patient Instructions (Signed)
Patient instructed to take medications as defined in the Anti-coagulation Track section of this encounter.  Patient instructed to take today's dose.  Patient verbalized understanding of these instructions.  Patient has been instructed to discuss with Dr. Daiva Eves the possibility of discontinuation of warfarin.

## 2010-07-11 ENCOUNTER — Other Ambulatory Visit (INDEPENDENT_AMBULATORY_CARE_PROVIDER_SITE_OTHER): Payer: Medicaid Other

## 2010-07-11 DIAGNOSIS — Z113 Encounter for screening for infections with a predominantly sexual mode of transmission: Secondary | ICD-10-CM

## 2010-07-11 DIAGNOSIS — I82409 Acute embolism and thrombosis of unspecified deep veins of unspecified lower extremity: Secondary | ICD-10-CM

## 2010-07-11 DIAGNOSIS — B20 Human immunodeficiency virus [HIV] disease: Secondary | ICD-10-CM

## 2010-07-12 LAB — CBC WITH DIFFERENTIAL/PLATELET
Basophils Absolute: 0 10*3/uL (ref 0.0–0.1)
Basophils Relative: 1 % (ref 0–1)
Lymphocytes Relative: 43 % (ref 12–46)
MCHC: 33.5 g/dL (ref 30.0–36.0)
Neutro Abs: 1.1 10*3/uL — ABNORMAL LOW (ref 1.7–7.7)
Neutrophils Relative %: 42 % — ABNORMAL LOW (ref 43–77)
Platelets: 210 10*3/uL (ref 150–400)
RDW: 14.6 % (ref 11.5–15.5)
WBC: 2.6 10*3/uL — ABNORMAL LOW (ref 4.0–10.5)

## 2010-07-12 LAB — COMPLETE METABOLIC PANEL WITH GFR
ALT: 17 U/L (ref 0–53)
AST: 23 U/L (ref 0–37)
Albumin: 4.9 g/dL (ref 3.5–5.2)
Alkaline Phosphatase: 124 U/L — ABNORMAL HIGH (ref 39–117)
Calcium: 9.5 mg/dL (ref 8.4–10.5)
Chloride: 105 mEq/L (ref 96–112)
Creat: 1.07 mg/dL (ref 0.40–1.50)
Potassium: 4.1 mEq/L (ref 3.5–5.3)

## 2010-07-12 LAB — RPR

## 2010-07-12 LAB — T-HELPER CELL (CD4) - (RCID CLINIC ONLY)
CD4 % Helper T Cell: 8 % — ABNORMAL LOW (ref 33–55)
CD4 T Cell Abs: 90 uL — ABNORMAL LOW (ref 400–2700)

## 2010-07-12 LAB — TOXOPLASMA ANTIBODIES- IGG AND  IGM: Toxoplasma Antibody- IgM: <0.1 IV (ref <0.90)

## 2010-07-13 LAB — HIV-1 RNA QUANT-NO REFLEX-BLD
HIV 1 RNA Quant: <20 copies/mL (ref <20)
HIV-1 RNA Quant, Log: <1.3 {Log} (ref <1.30)

## 2010-07-13 LAB — GLUCOSE 6 PHOSPHATE DEHYDROGENASE: G-6-PD, Quant: 13 U/GM HGB (ref 7.0–20.5)

## 2010-07-16 ENCOUNTER — Ambulatory Visit (INDEPENDENT_AMBULATORY_CARE_PROVIDER_SITE_OTHER): Payer: Medicaid Other | Admitting: Pharmacist

## 2010-07-16 DIAGNOSIS — I2699 Other pulmonary embolism without acute cor pulmonale: Secondary | ICD-10-CM

## 2010-07-16 DIAGNOSIS — I82409 Acute embolism and thrombosis of unspecified deep veins of unspecified lower extremity: Secondary | ICD-10-CM

## 2010-07-16 DIAGNOSIS — Z7901 Long term (current) use of anticoagulants: Secondary | ICD-10-CM

## 2010-07-16 NOTE — Progress Notes (Signed)
Anti-Coagulation Progress Note  Lance Craig is a 30 y.o. male who is currently on an anti-coagulation regimen.    RECENT RESULTS: Recent results are below, the most recent result is correlated with a dose of 42.5 mg. per week: Lab Results  Component Value Date   INR 2.6 07/16/2010   INR 2.45* 07/11/2010   INR 2.2 06/18/2010    ANTI-COAG DOSE:   Latest dosing instructions   Total Sun Mon Tue Wed Thu Fri Sat   42.5 5 mg 7.5 mg 5 mg 7.5 mg 5 mg 7.5 mg 5 mg    (5 mg1) (5 mg1.5) (5 mg1) (5 mg1.5) (5 mg1) (5 mg1.5) (5 mg1)         ANTICOAG SUMMARY: Anticoagulation Episode Summary              Current INR goal 2.0-3.0 Next INR check 08/27/2010   INR from last check 2.6 (07/16/2010)     Weekly max dose (mg)  Target end date 06/18/2010   Indications PE, DVT, Long term current use of anticoagulant   INR check location [facility] Preferred lab    Send INR reminders to [facility]   Comments        Provider Role Specialty Phone number   Acey Lav, MD  Infectious Diseases [phone]        ANTICOAG TODAY: Anticoagulation Summary as of 07/16/2010              INR goal 2.0-3.0     Selected INR 2.6 (07/16/2010) Next INR check 08/27/2010   Weekly max dose (mg)  Target end date 06/18/2010   Indications PE, DVT, Long term current use of anticoagulant    Anticoagulation Episode Summary              INR check location [facility] Preferred lab    Send INR reminders to [facility]   Comments        Provider Role Specialty Phone number   Acey Lav, MD  Infectious Diseases [phone]        PATIENT INSTRUCTIONS: Patient Instructions  Patient instructed to take medications as defined in the Anti-coagulation Track section of this encounter.  Patient instructed to take today's dose.  Patient verbalized understanding of these instructions.        FOLLOW-UP No Follow-up on file.  [name], [name] Pharm.D., CACP

## 2010-07-16 NOTE — Patient Instructions (Signed)
Patient instructed to take medications as defined in the Anti-coagulation Track section of this encounter.  Patient instructed to take today's dose.  Patient verbalized understanding of these instructions.    

## 2010-07-19 NOTE — Progress Notes (Signed)
That is fine to stop his anticoagulation Rosanne Ashing. Thanks!

## 2010-07-25 ENCOUNTER — Ambulatory Visit: Payer: Medicaid Other | Admitting: Infectious Disease

## 2010-07-31 ENCOUNTER — Encounter: Payer: Self-pay | Admitting: Infectious Disease

## 2010-07-31 ENCOUNTER — Ambulatory Visit (INDEPENDENT_AMBULATORY_CARE_PROVIDER_SITE_OTHER): Payer: Medicaid Other | Admitting: Infectious Disease

## 2010-07-31 VITALS — BP 142/95 | HR 81 | Temp 97.5°F | Ht 69.0 in | Wt 187.8 lb

## 2010-07-31 DIAGNOSIS — F419 Anxiety disorder, unspecified: Secondary | ICD-10-CM

## 2010-07-31 DIAGNOSIS — F0630 Mood disorder due to known physiological condition, unspecified: Secondary | ICD-10-CM

## 2010-07-31 DIAGNOSIS — I82409 Acute embolism and thrombosis of unspecified deep veins of unspecified lower extremity: Secondary | ICD-10-CM

## 2010-07-31 DIAGNOSIS — F411 Generalized anxiety disorder: Secondary | ICD-10-CM

## 2010-07-31 DIAGNOSIS — B20 Human immunodeficiency virus [HIV] disease: Secondary | ICD-10-CM

## 2010-07-31 DIAGNOSIS — E538 Deficiency of other specified B group vitamins: Secondary | ICD-10-CM

## 2010-07-31 LAB — FOLATE: Folate: 8.2 ng/mL

## 2010-07-31 MED ORDER — EFAVIRENZ-EMTRICITAB-TENOFOVIR 600-200-300 MG PO TABS: 1.0000 | ORAL_TABLET | Freq: Every day | ORAL | Status: DC

## 2010-07-31 MED ORDER — SULFAMETHOXAZOLE-TRIMETHOPRIM 800-160 MG PO TABS: 1.0000 | ORAL_TABLET | Freq: Every day | ORAL | Status: DC

## 2010-07-31 MED ORDER — LORAZEPAM 0.5 MG PO TABS: 0.5000 mg | ORAL_TABLET | Freq: Three times a day (TID) | ORAL | Status: AC | PRN

## 2010-07-31 NOTE — Assessment & Plan Note (Signed)
He at times feels depressed but not suicidal homicidal ideation. He does not want antidepressant medication this point time. He contracted for safety.

## 2010-07-31 NOTE — Assessment & Plan Note (Signed)
Recheck folate. If normal will observe him off of folic acid for now

## 2010-07-31 NOTE — Assessment & Plan Note (Signed)
He has finished a course of coumadin and is now off of this

## 2010-07-31 NOTE — Progress Notes (Signed)
  Subjective:    Patient ID: Lance Craig, male    DOB: 1980/11/08, 30 y.o.   MRN: [id number]  HPI 30 year old African American whom I met this past fall with  Pulmoanry embolism and PCP pneumonia and newly diagnosied HIV, AIDS. HIs cd4<10 and his viral load in hudrends of thousands intially. Genotype prior to starting ARV showed one etravirine mutaiton but full susceptiblity to [place]. He has responded very well to [place] with undetectable viral load, with healthy CD4 count now at 90. He has no other specific complaints today. He has been off his folate for three weeks now.   Review of Systems  Constitutional: Negative for chills, diaphoresis, activity change and appetite change.  HENT: Negative for facial swelling, neck pain, neck stiffness and ear discharge.   Respiratory: Negative for apnea, cough, choking and chest tightness.   Cardiovascular: Negative for chest pain, palpitations and leg swelling.  Gastrointestinal: Negative for abdominal pain, constipation, blood in stool, abdominal distention and anal bleeding.  Genitourinary: Negative for dysuria and difficulty urinating.  Musculoskeletal: Negative for myalgias, back pain, joint swelling, arthralgias and gait problem.  Skin: Negative for color change, pallor and rash.  Neurological: Negative for dizziness and facial asymmetry.  Hematological: Negative for adenopathy. Does not bruise/bleed easily.  Psychiatric/Behavioral: Negative for behavioral problems, confusion, sleep disturbance, self-injury, dysphoric mood, decreased concentration and agitation. The patient is not nervous/anxious.        Objective:   Physical Exam  Constitutional: He is oriented to person, place, and time. He appears well-developed and well-nourished. No distress.  HENT:  Head: Normocephalic and atraumatic.  Mouth/Throat: Oropharynx is clear and moist. No oropharyngeal exudate.  Eyes: EOM are normal. Pupils are equal, round, and reactive to light. Right  eye exhibits no discharge. No scleral icterus.  Neck: Normal range of motion. Neck supple. No tracheal deviation present. No thyromegaly present.  Cardiovascular: Normal rate, regular rhythm and intact distal pulses.  Exam reveals no gallop and no friction rub.   No murmur heard. Pulmonary/Chest: Effort normal and breath sounds normal. No respiratory distress. He has no wheezes. He has no rales. He exhibits no tenderness.  Abdominal: He exhibits no distension and no mass. There is no tenderness. There is no rebound and no guarding.  Musculoskeletal: He exhibits no edema and no tenderness.  Neurological: He is alert and oriented to person, place, and time. Coordination normal.  Skin: Skin is warm and dry. No rash noted. He is not diaphoretic. No erythema. No pallor.  Psychiatric: He has a normal mood and affect. His behavior is normal. Judgment and thought content normal.          Assessment & Plan:  AIDS Continue Atripla. Discontinue azithromycin continue Bactrim make sure he is up-to-date on his vaccinations.  DVT He has finished a course of coumadin and is now off of this  Folic acid deficiency Recheck folate. If normal will observe him off of folic acid for now  MOOD DISORDER IN CONDITIONS CLASSIFIED ELSEWHERE He at times feels depressed but not suicidal homicidal ideation. He does not want antidepressant medication this point time. He contracted for safety.

## 2010-07-31 NOTE — Patient Instructions (Signed)
Great job!!! Come back in four months for labs and repeat appt Make sure you fast the night before labs (at least 10 hours without food or calories before the blood work is drawn)

## 2010-07-31 NOTE — Assessment & Plan Note (Signed)
Continue Atripla. Discontinue azithromycin continue Bactrim make sure he is up-to-date on his vaccinations.

## 2010-08-27 ENCOUNTER — Ambulatory Visit: Payer: Medicaid Other

## 2011-01-29 ENCOUNTER — Other Ambulatory Visit: Payer: Self-pay | Admitting: *Deleted

## 2011-01-29 DIAGNOSIS — B20 Human immunodeficiency virus [HIV] disease: Secondary | ICD-10-CM

## 2011-01-29 MED ORDER — EFAVIRENZ-EMTRICITAB-TENOFOVIR 600-200-300 MG PO TABS: 1.0000 | ORAL_TABLET | Freq: Every day | ORAL | Status: DC

## 2011-02-04 ENCOUNTER — Other Ambulatory Visit: Payer: Self-pay | Admitting: *Deleted

## 2011-02-04 DIAGNOSIS — B20 Human immunodeficiency virus [HIV] disease: Secondary | ICD-10-CM

## 2011-02-04 MED ORDER — SULFAMETHOXAZOLE-TRIMETHOPRIM 400-80 MG PO TABS: 1.0000 | ORAL_TABLET | Freq: Every day | ORAL | Status: DC

## 2011-02-06 ENCOUNTER — Other Ambulatory Visit (INDEPENDENT_AMBULATORY_CARE_PROVIDER_SITE_OTHER): Payer: Medicaid Other

## 2011-02-06 ENCOUNTER — Telehealth: Payer: Self-pay

## 2011-02-06 ENCOUNTER — Other Ambulatory Visit: Payer: Self-pay | Admitting: Infectious Disease

## 2011-02-06 DIAGNOSIS — B20 Human immunodeficiency virus [HIV] disease: Secondary | ICD-10-CM

## 2011-02-06 DIAGNOSIS — Z79899 Other long term (current) drug therapy: Secondary | ICD-10-CM

## 2011-02-06 DIAGNOSIS — Z113 Encounter for screening for infections with a predominantly sexual mode of transmission: Secondary | ICD-10-CM

## 2011-02-06 LAB — CBC WITH DIFFERENTIAL/PLATELET
Basophils Absolute: 0 10*3/uL (ref 0.0–0.1)
Lymphocytes Relative: 41 % (ref 12–46)
Lymphs Abs: 1.2 10*3/uL (ref 0.7–4.0)
Neutro Abs: 1.5 10*3/uL — ABNORMAL LOW (ref 1.7–7.7)
Neutrophils Relative %: 49 % (ref 43–77)
Platelets: 241 10*3/uL (ref 150–400)
RBC: 4.96 MIL/uL (ref 4.22–5.81)
RDW: 13.7 % (ref 11.5–15.5)
WBC: 3 10*3/uL — ABNORMAL LOW (ref 4.0–10.5)

## 2011-02-06 LAB — COMPREHENSIVE METABOLIC PANEL
ALT: 15 U/L (ref 0–53)
AST: 22 U/L (ref 0–37)
CO2: 23 mEq/L (ref 19–32)
Calcium: 9.3 mg/dL (ref 8.4–10.5)
Chloride: 107 mEq/L (ref 96–112)
Creat: 1 mg/dL (ref 0.50–1.35)
Potassium: 3.9 mEq/L (ref 3.5–5.3)
Sodium: 139 mEq/L (ref 135–145)
Total Protein: 7.3 g/dL (ref 6.0–8.3)

## 2011-02-06 LAB — LIPID PANEL
LDL Cholesterol: 121 mg/dL — ABNORMAL HIGH (ref 0–99)
Triglycerides: 89 mg/dL (ref <150)
VLDL: 18 mg/dL (ref 0–40)

## 2011-02-06 LAB — RPR

## 2011-02-06 NOTE — Telephone Encounter (Signed)
Patient walked  in to renew [employer] card - card expires 02/15/11 - he now has Medicaid, so did not renew. Explained the RW changes that took place in 2012 - one being having Medicaid he was no longer eligible  for RW for clinic and lab bill coverage.

## 2011-02-07 LAB — GC/CHLAMYDIA PROBE AMP, URINE: GC Probe Amp, Urine: NEGATIVE

## 2011-02-20 ENCOUNTER — Ambulatory Visit (INDEPENDENT_AMBULATORY_CARE_PROVIDER_SITE_OTHER): Payer: Medicaid Other | Admitting: Infectious Disease

## 2011-02-20 ENCOUNTER — Encounter: Payer: Self-pay | Admitting: Infectious Disease

## 2011-02-20 VITALS — BP 143/87 | HR 89 | Temp 97.4°F | Resp 16 | Ht 69.0 in | Wt 184.8 lb

## 2011-02-20 DIAGNOSIS — M2550 Pain in unspecified joint: Secondary | ICD-10-CM

## 2011-02-20 DIAGNOSIS — E538 Deficiency of other specified B group vitamins: Secondary | ICD-10-CM

## 2011-02-20 DIAGNOSIS — F0630 Mood disorder due to known physiological condition, unspecified: Secondary | ICD-10-CM

## 2011-02-20 DIAGNOSIS — B20 Human immunodeficiency virus [HIV] disease: Secondary | ICD-10-CM

## 2011-02-20 DIAGNOSIS — Z23 Encounter for immunization: Secondary | ICD-10-CM

## 2011-02-20 MED ORDER — HYDROCODONE-ACETAMINOPHEN 5-325 MG PO TABS: 1.0000 | ORAL_TABLET | Freq: Every day | ORAL | Status: DC | PRN

## 2011-02-20 MED ORDER — LORAZEPAM 0.5 MG PO TABS: 0.5000 mg | ORAL_TABLET | Freq: Two times a day (BID) | ORAL | Status: DC | PRN

## 2011-02-21 DIAGNOSIS — M2550 Pain in unspecified joint: Secondary | ICD-10-CM | POA: Insufficient documentation

## 2011-02-21 NOTE — Assessment & Plan Note (Signed)
Recheck folate next visit. He is no longer anemic

## 2011-02-21 NOTE — Assessment & Plan Note (Signed)
Ok to use NSAIds and if not controlled ok low dose narcotic

## 2011-02-21 NOTE — Progress Notes (Signed)
  Subjective:    Patient ID: Lance Craig, male    DOB: [birth year], 30 y.o.   MRN: [id number]  HPI  Lance Craig is a 30 y.o. male who is doing superbly well on their antiviral regimen, with undetectable viral load cd4 that has risen to near 200. He has finished rx for DVT and doing well.  He is concerned that he may have folate deficiency still.He still has anxiety and asked for refill of this medicine. At times he has joint pain that does not respond to NSAIds and asked for refill on rx narcotic Su Hoff he is doing very well. I spent greater than 45 minutes with the patient including greater than 50% of time in face to face counsel of the patient and in coordination of their care.   Review of Systems  Constitutional: Negative for fever, chills, diaphoresis, activity change, appetite change, fatigue and unexpected weight change.  HENT: Negative for congestion, sore throat, rhinorrhea, sneezing, trouble swallowing and sinus pressure.   Eyes: Negative for photophobia and visual disturbance.  Respiratory: Negative for cough, chest tightness, shortness of breath, wheezing and stridor.   Cardiovascular: Negative for chest pain, palpitations and leg swelling.  Gastrointestinal: Negative for nausea, vomiting, abdominal pain, diarrhea, constipation, blood in stool, abdominal distention and anal bleeding.  Genitourinary: Negative for dysuria, hematuria, flank pain and difficulty urinating.  Musculoskeletal: Negative for myalgias, back pain, joint swelling, arthralgias and gait problem.  Skin: Negative for color change, pallor, rash and wound.  Neurological: Negative for dizziness, tremors, weakness and light-headedness.  Hematological: Negative for adenopathy. Does not bruise/bleed easily.  Psychiatric/Behavioral: Negative for behavioral problems, confusion, sleep disturbance, dysphoric mood, decreased concentration and agitation.       Objective:   Physical Exam  Constitutional: He is  oriented to person, place, and time. He appears well-developed and well-nourished. No distress.  HENT:  Head: Normocephalic and atraumatic.  Mouth/Throat: Oropharynx is clear and moist. No oropharyngeal exudate.  Eyes: Conjunctivae and EOM are normal. Pupils are equal, round, and reactive to light. No scleral icterus.  Neck: Normal range of motion. Neck supple. No JVD present.  Cardiovascular: Normal rate, regular rhythm and normal heart sounds.  Exam reveals no gallop and no friction rub.   No murmur heard. Pulmonary/Chest: Effort normal and breath sounds normal. No respiratory distress. He has no wheezes. He has no rales. He exhibits no tenderness.  Abdominal: He exhibits no distension and no mass. There is no tenderness. There is no rebound and no guarding.  Musculoskeletal: He exhibits no edema and no tenderness.  Lymphadenopathy:    He has no cervical adenopathy.  Neurological: He is alert and oriented to person, place, and time. He has normal reflexes. He exhibits normal muscle tone. Coordination normal.  Skin: Skin is warm and dry. He is not diaphoretic. No erythema. No pallor.  Psychiatric: He has a normal mood and affect. His behavior is normal. Judgment and thought content normal.          Assessment & Plan:  AIDS Continue atripla  MOOD DISORDER IN CONDITIONS CLASSIFIED ELSEWHERE Stable continue benzodiazepen, does not want ssri  Folic acid deficiency Recheck folate next visit. He is no longer anemic  Joint pain Ok to use NSAIds and if not controlled ok low dose narcotic

## 2011-02-21 NOTE — Assessment & Plan Note (Signed)
Continue atripla 

## 2011-02-21 NOTE — Assessment & Plan Note (Addendum)
Stable continue benzodiazepen, does not want ssri

## 2011-03-11 ENCOUNTER — Other Ambulatory Visit: Payer: Self-pay | Admitting: *Deleted

## 2011-03-11 DIAGNOSIS — B20 Human immunodeficiency virus [HIV] disease: Secondary | ICD-10-CM

## 2011-03-11 MED ORDER — SULFAMETHOXAZOLE-TRIMETHOPRIM 400-80 MG PO TABS: 1.0000 | ORAL_TABLET | Freq: Every day | ORAL | Status: DC

## 2011-06-03 ENCOUNTER — Other Ambulatory Visit: Payer: Self-pay | Admitting: Licensed Clinical Social Worker

## 2011-06-03 DIAGNOSIS — B20 Human immunodeficiency virus [HIV] disease: Secondary | ICD-10-CM

## 2011-06-26 ENCOUNTER — Telehealth: Payer: Self-pay | Admitting: *Deleted

## 2011-06-26 ENCOUNTER — Other Ambulatory Visit: Payer: Medicaid Other

## 2011-06-26 DIAGNOSIS — B20 Human immunodeficiency virus [HIV] disease: Secondary | ICD-10-CM

## 2011-06-26 LAB — COMPREHENSIVE METABOLIC PANEL
BUN: 12 mg/dL (ref 6–23)
CO2: 20 mEq/L (ref 19–32)
Glucose, Bld: 95 mg/dL (ref 70–99)
Sodium: 136 mEq/L (ref 135–145)
Total Bilirubin: 0.3 mg/dL (ref 0.3–1.2)
Total Protein: 7.7 g/dL (ref 6.0–8.3)

## 2011-06-26 LAB — CBC WITH DIFFERENTIAL/PLATELET
Eosinophils Relative: 3 % (ref 0–5)
Hemoglobin: 14 g/dL (ref 13.0–17.0)
Lymphocytes Relative: 52 % — ABNORMAL HIGH (ref 12–46)
Lymphs Abs: 1.5 10*3/uL (ref 0.7–4.0)
MCV: 88 fL (ref 78.0–100.0)
Neutrophils Relative %: 35 % — ABNORMAL LOW (ref 43–77)
Platelets: 246 10*3/uL (ref 150–400)
RBC: 4.91 MIL/uL (ref 4.22–5.81)
WBC: 3 10*3/uL — ABNORMAL LOW (ref 4.0–10.5)

## 2011-06-26 NOTE — Telephone Encounter (Signed)
Patient came in for labs and stated he thinks he may be having issues with allergies,( runny nose and congestion), he previously has taken Claritin for this.  He is going to try this and if no improvement will call tomorrow for an appointment on Friday with one of the available providers. Wendall Mola CMA

## 2011-06-28 ENCOUNTER — Ambulatory Visit (INDEPENDENT_AMBULATORY_CARE_PROVIDER_SITE_OTHER): Payer: Medicaid Other | Admitting: Internal Medicine

## 2011-06-28 ENCOUNTER — Encounter: Payer: Self-pay | Admitting: Internal Medicine

## 2011-06-28 VITALS — BP 135/87 | HR 76 | Temp 98.0°F | Ht 69.0 in | Wt 195.5 lb

## 2011-06-28 DIAGNOSIS — J302 Other seasonal allergic rhinitis: Secondary | ICD-10-CM

## 2011-06-28 DIAGNOSIS — J309 Allergic rhinitis, unspecified: Secondary | ICD-10-CM

## 2011-06-28 DIAGNOSIS — B20 Human immunodeficiency virus [HIV] disease: Secondary | ICD-10-CM

## 2011-06-28 LAB — HIV-1 RNA QUANT-NO REFLEX-BLD: HIV 1 RNA Quant: <20 copies/mL (ref <20)

## 2011-06-28 MED ORDER — LORATADINE 10 MG PO TABS: 10.0000 mg | ORAL_TABLET | Freq: Every day | ORAL | Status: DC

## 2011-06-28 NOTE — Progress Notes (Signed)
Patient ID: Lance Craig, male   DOB: 10-30-1980, 31 y.o.   MRN: [id number]  INFECTIOUS DISEASE PROGRESS NOTE    Subjective: Lance Craig is seen on a work in basis. He has developed sinus congestion over the last week and is wondering if it might be do to allergies. He had a similar problem at this time last year. He has had lots of sinus congestion but no fever, chills, sore throat or other cold symptoms. He has not tried taking anything for it because he was not sure if it would be okay with his other medicines. He has not missed a single dose of his Atripla. He was given a prescription for narcotic pain medicine but has never taken it. He states that he gets anxious sometimes when he is under stress. He has taken Ativan once or twice in the past month. He has not needed any Ambien for sleep.  Objective: Temp: 98 F (36.7 C) (04/12 1108) Temp src: Oral (04/12 1108) BP: 135/87 mmHg (04/12 1108) Pulse Rate: 76  (04/12 1108)  General: he is healthy-appearing in no distress Skin: no rash Lungs: clear Cor: regular S1 and S2 with no murmurs Abdomen: nontender  Lab Results HIV 1 RNA Quant (copies/mL)  Date Value  06/26/2011 <20   02/06/2011 <20   07/11/2010 <20      CD4 T Cell Abs (cmm)  Date Value  06/26/2011 160*  02/06/2011 130*  07/11/2010 90*     Assessment: His HIV infection is coming under much better control since he started antiretroviral therapy a year and a half ago. I will continue Atripla and Septra for now.  He may be suffering from some seasonal allergies. I suggested that he try over-the-counter loratadine to see if that helps.  Plan: 1. Continue current medications 2. Start a trial of loratadine 3. Return in 6 months after lab work   Lance Asters, MD [facility] [phone] pager   [phone] cell 06/28/2011, 12:12 PM

## 2011-07-10 ENCOUNTER — Ambulatory Visit (INDEPENDENT_AMBULATORY_CARE_PROVIDER_SITE_OTHER): Payer: Medicaid Other | Admitting: Infectious Disease

## 2011-07-10 ENCOUNTER — Encounter: Payer: Self-pay | Admitting: Infectious Disease

## 2011-07-10 VITALS — BP 132/91 | HR 73 | Temp 97.5°F | Ht 69.0 in | Wt 194.5 lb

## 2011-07-10 DIAGNOSIS — J309 Allergic rhinitis, unspecified: Secondary | ICD-10-CM

## 2011-07-10 DIAGNOSIS — Z21 Asymptomatic human immunodeficiency virus [HIV] infection status: Secondary | ICD-10-CM

## 2011-07-10 DIAGNOSIS — Z23 Encounter for immunization: Secondary | ICD-10-CM

## 2011-07-10 DIAGNOSIS — J302 Other seasonal allergic rhinitis: Secondary | ICD-10-CM

## 2011-07-10 DIAGNOSIS — R635 Abnormal weight gain: Secondary | ICD-10-CM

## 2011-07-10 DIAGNOSIS — B20 Human immunodeficiency virus [HIV] disease: Secondary | ICD-10-CM

## 2011-07-10 MED ORDER — LORAZEPAM 0.5 MG PO TABS: 0.5000 mg | ORAL_TABLET | Freq: Two times a day (BID) | ORAL | Status: DC | PRN

## 2011-07-10 NOTE — Assessment & Plan Note (Signed)
Improved on claritin

## 2011-07-10 NOTE — Assessment & Plan Note (Signed)
Perfect control. Recheck CD4 count in a few months, continue bactrim

## 2011-07-10 NOTE — Progress Notes (Signed)
  Subjective:    Patient ID: Lance Craig, male    DOB: Jul 18, 1980, 31 y.o.   MRN: [id number]  HPI  Lance Craig is a 31 y.o. male who is doing superbly well on their antiviral regimen, with undetectable viral load cd4 that has risen to near  200. He was seen earlier this month by Dr. Orvan Falconer for allergic rhinitis and rx histamine (anti) which has helped greatly. He is doing well and no new concerns today. ALl labs reviewed w pt.  Review of Systems  Constitutional: Negative for fever, chills, diaphoresis, activity change, appetite change, fatigue and unexpected weight change.  HENT: Negative for congestion, sore throat, rhinorrhea, sneezing, trouble swallowing and sinus pressure.   Eyes: Negative for photophobia and visual disturbance.  Respiratory: Negative for cough, chest tightness, shortness of breath, wheezing and stridor.   Cardiovascular: Negative for chest pain, palpitations and leg swelling.  Gastrointestinal: Negative for nausea, vomiting, abdominal pain, diarrhea, constipation, blood in stool, abdominal distention and anal bleeding.  Genitourinary: Negative for dysuria, hematuria, flank pain and difficulty urinating.  Musculoskeletal: Negative for myalgias, back pain, joint swelling, arthralgias and gait problem.  Skin: Negative for color change, pallor, rash and wound.  Neurological: Negative for dizziness, tremors, weakness and light-headedness.  Hematological: Negative for adenopathy. Does not bruise/bleed easily.  Psychiatric/Behavioral: Negative for behavioral problems, confusion, sleep disturbance, dysphoric mood, decreased concentration and agitation.       Objective:   Physical Exam  Constitutional: He is oriented to person, place, and time. He appears well-developed and well-nourished. No distress.  HENT:  Head: Normocephalic and atraumatic.  Mouth/Throat: Oropharynx is clear and moist. No oropharyngeal exudate.  Eyes: Conjunctivae and EOM are normal. Pupils  are equal, round, and reactive to light. No scleral icterus.  Neck: Normal range of motion. Neck supple. No JVD present.  Cardiovascular: Normal rate, regular rhythm and normal heart sounds.  Exam reveals no gallop and no friction rub.   No murmur heard. Pulmonary/Chest: Effort normal and breath sounds normal. No respiratory distress. He has no wheezes. He has no rales. He exhibits no tenderness.  Abdominal: He exhibits no distension and no mass. There is no tenderness. There is no rebound and no guarding.  Musculoskeletal: He exhibits no edema and no tenderness.  Lymphadenopathy:    He has no cervical adenopathy.  Neurological: He is alert and oriented to person, place, and time. He has normal reflexes. He exhibits normal muscle tone. Coordination normal.  Skin: Skin is warm and dry. He is not diaphoretic. No erythema. No pallor.  Psychiatric: He has a normal mood and affect. His behavior is normal. Judgment and thought content normal.          Assessment & Plan:  HIV infection Perfect control. Recheck CD4 count in a few months, continue bactrim  Seasonal allergies Improved on claritin  Weight gain Low carb diet and exercise

## 2011-07-10 NOTE — Assessment & Plan Note (Signed)
Low carb diet and exercise.   °

## 2011-09-04 ENCOUNTER — Other Ambulatory Visit: Payer: Self-pay | Admitting: *Deleted

## 2011-09-04 DIAGNOSIS — B20 Human immunodeficiency virus [HIV] disease: Secondary | ICD-10-CM

## 2011-09-04 MED ORDER — EFAVIRENZ-EMTRICITAB-TENOFOVIR 600-200-300 MG PO TABS: 1.0000 | ORAL_TABLET | Freq: Every day | ORAL | Status: DC

## 2011-12-30 ENCOUNTER — Other Ambulatory Visit (INDEPENDENT_AMBULATORY_CARE_PROVIDER_SITE_OTHER): Payer: Medicaid Other

## 2011-12-30 DIAGNOSIS — Z21 Asymptomatic human immunodeficiency virus [HIV] infection status: Secondary | ICD-10-CM

## 2011-12-30 DIAGNOSIS — Z113 Encounter for screening for infections with a predominantly sexual mode of transmission: Secondary | ICD-10-CM

## 2011-12-30 DIAGNOSIS — B20 Human immunodeficiency virus [HIV] disease: Secondary | ICD-10-CM

## 2011-12-30 LAB — LIPID PANEL
HDL: 49 mg/dL (ref >39)
Triglycerides: 86 mg/dL (ref <150)

## 2011-12-30 LAB — COMPLETE METABOLIC PANEL WITH GFR
Albumin: 4 g/dL (ref 3.5–5.2)
BUN: 12 mg/dL (ref 6–23)
Calcium: 9.1 mg/dL (ref 8.4–10.5)
Chloride: 104 mEq/L (ref 96–112)
GFR, Est Non African American: >89 mL/min
Glucose, Bld: 90 mg/dL (ref 70–99)
Potassium: 4.2 mEq/L (ref 3.5–5.3)

## 2011-12-30 LAB — CBC WITH DIFFERENTIAL/PLATELET
Basophils Absolute: 0 10*3/uL (ref 0.0–0.1)
Lymphocytes Relative: 41 % (ref 12–46)
Neutro Abs: 1.5 10*3/uL — ABNORMAL LOW (ref 1.7–7.7)
Platelets: 246 10*3/uL (ref 150–400)
RDW: 13.5 % (ref 11.5–15.5)
WBC: 3.4 10*3/uL — ABNORMAL LOW (ref 4.0–10.5)

## 2011-12-31 LAB — T-HELPER CELL (CD4) - (RCID CLINIC ONLY): CD4 T Cell Abs: 190 uL — ABNORMAL LOW (ref 400–2700)

## 2011-12-31 LAB — RPR

## 2012-01-13 ENCOUNTER — Ambulatory Visit: Payer: Medicaid Other | Admitting: Infectious Disease

## 2012-01-14 ENCOUNTER — Ambulatory Visit (INDEPENDENT_AMBULATORY_CARE_PROVIDER_SITE_OTHER): Payer: Medicaid Other | Admitting: Infectious Disease

## 2012-01-14 ENCOUNTER — Encounter: Payer: Self-pay | Admitting: Infectious Disease

## 2012-01-14 VITALS — BP 139/80 | HR 80 | Temp 97.9°F | Ht 69.0 in | Wt 198.0 lb

## 2012-01-14 DIAGNOSIS — B20 Human immunodeficiency virus [HIV] disease: Secondary | ICD-10-CM

## 2012-01-14 DIAGNOSIS — F411 Generalized anxiety disorder: Secondary | ICD-10-CM

## 2012-01-14 DIAGNOSIS — Z21 Asymptomatic human immunodeficiency virus [HIV] infection status: Secondary | ICD-10-CM

## 2012-01-14 DIAGNOSIS — F419 Anxiety disorder, unspecified: Secondary | ICD-10-CM

## 2012-01-14 NOTE — Progress Notes (Signed)
  Subjective:    Patient ID: Lance Craig, male    DOB: 1980/08/11, 31 y.o.   MRN: [id number]  HPI  Lance Craig is a 31 y.o. male who is doing superbly well on his  antiviral regimen, atripla with undetectable viral load CD4 count of 190. 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 We discussed dosing of his Atripla to accommodate shifting schedules versus changing to a different antiviral regimen. He would change I would consider changing him to [facility] OR TIVICAY/TRUVADA and hope for benefit of potential increase in CD4 count on integrase inhibitor.   Review of Systems  Constitutional: Negative for fever, chills, diaphoresis, activity change, appetite change, fatigue and unexpected weight change.  HENT: Negative for congestion, sore throat, rhinorrhea, sneezing, trouble swallowing and sinus pressure.   Eyes: Negative for photophobia and visual disturbance.  Respiratory: Negative for cough, chest tightness, shortness of breath, wheezing and stridor.   Cardiovascular: Negative for chest pain, palpitations and leg swelling.  Gastrointestinal: Negative for nausea, vomiting, abdominal pain, diarrhea, constipation, blood in stool, abdominal distention and anal bleeding.  Genitourinary: Negative for dysuria, hematuria, flank pain and difficulty urinating.  Musculoskeletal: Negative for myalgias, back pain, joint swelling, arthralgias and gait problem.  Skin: Negative for color change, pallor, rash and wound.  Neurological: Negative for dizziness, tremors, weakness and light-headedness.  Hematological: Negative for adenopathy. Does not bruise/bleed easily.  Psychiatric/Behavioral: Negative for behavioral problems, confusion, disturbed wake/sleep cycle, dysphoric mood, decreased concentration and agitation.       Objective:   Physical Exam  Constitutional: He is oriented to person, place, and time. He appears well-developed and  well-nourished. No distress.  HENT:  Head: Normocephalic and atraumatic.  Mouth/Throat: Oropharynx is clear and moist. No oropharyngeal exudate.  Eyes: Conjunctivae normal and EOM are normal. Pupils are equal, round, and reactive to light. No scleral icterus.  Neck: Normal range of motion. Neck supple. No JVD present.  Cardiovascular: Normal rate, regular rhythm and normal heart sounds.  Exam reveals no gallop and no friction rub.   No murmur heard. Pulmonary/Chest: Effort normal and breath sounds normal. No respiratory distress. He has no wheezes. He has no rales. He exhibits no tenderness.  Abdominal: He exhibits no distension and no mass. There is no tenderness. There is no rebound and no guarding.  Musculoskeletal: He exhibits no edema and no tenderness.  Lymphadenopathy:    He has no cervical adenopathy.  Neurological: He is alert and oriented to person, place, and time. He has normal reflexes. He exhibits normal muscle tone. Coordination normal.  Skin: Skin is warm and dry. He is not diaphoretic. No erythema. No pallor.  Psychiatric: He has a normal mood and affect. His behavior is normal. Judgment and thought content normal.          Assessment & Plan:   #1 HIV : Controlled, he will consider possible other regimens as described above.  #2 anxiety: Continue as needed benzodiazepine.

## 2012-01-14 NOTE — Patient Instructions (Addendum)
Let me know how your work schedule will be and whether you would want to change from   Atripla  To either:   STRIBILD ONE TABLET DAILY  OR   TIVICAY ONE TABLET DAILY WITH TRUVADA ONE TABLET DAILY

## 2012-01-30 ENCOUNTER — Other Ambulatory Visit: Payer: Self-pay | Admitting: *Deleted

## 2012-01-30 DIAGNOSIS — F0630 Mood disorder due to known physiological condition, unspecified: Secondary | ICD-10-CM

## 2012-01-30 MED ORDER — LORAZEPAM 0.5 MG PO TABS: 0.5000 mg | ORAL_TABLET | Freq: Two times a day (BID) | ORAL | Status: DC | PRN

## 2012-01-30 NOTE — Telephone Encounter (Signed)
Original rx start 07/10/11 w/ 4 refills.  Last refill dated 01/03/12.  Dr. Daiva Eves please advise re:  Refill instructions.

## 2012-01-30 NOTE — Telephone Encounter (Signed)
Fine to refill 

## 2012-01-30 NOTE — Addendum Note (Signed)
Addended by: Jennet Maduro D on: 01/30/2012 03:17 PM   Modules accepted: Orders

## 2012-02-05 ENCOUNTER — Telehealth: Payer: Self-pay | Admitting: *Deleted

## 2012-02-05 NOTE — Telephone Encounter (Signed)
Pt questioning if he can take his Atripla after he gets off work in the morning before going to bed.  RN assured the pt the this was an appropriate way to take the medication when working the night shift.  Congratulated the pt on coming up with an appropriate plan.

## 2012-03-13 ENCOUNTER — Other Ambulatory Visit: Payer: Self-pay | Admitting: *Deleted

## 2012-03-13 DIAGNOSIS — B20 Human immunodeficiency virus [HIV] disease: Secondary | ICD-10-CM

## 2012-03-13 MED ORDER — EFAVIRENZ-EMTRICITAB-TENOFOVIR 600-200-300 MG PO TABS: 1.0000 | ORAL_TABLET | Freq: Every day | ORAL | Status: DC

## 2012-05-01 IMAGING — CR DG CHEST 2V
2 series · 2 of 2 positions shown · non-contrast
Comparison: Chest x-ray of 12/14/2008

CLINICAL DATA: Cough, congestion, fever

CHEST - 2 VIEW

[view not recorded (1 of 2)]
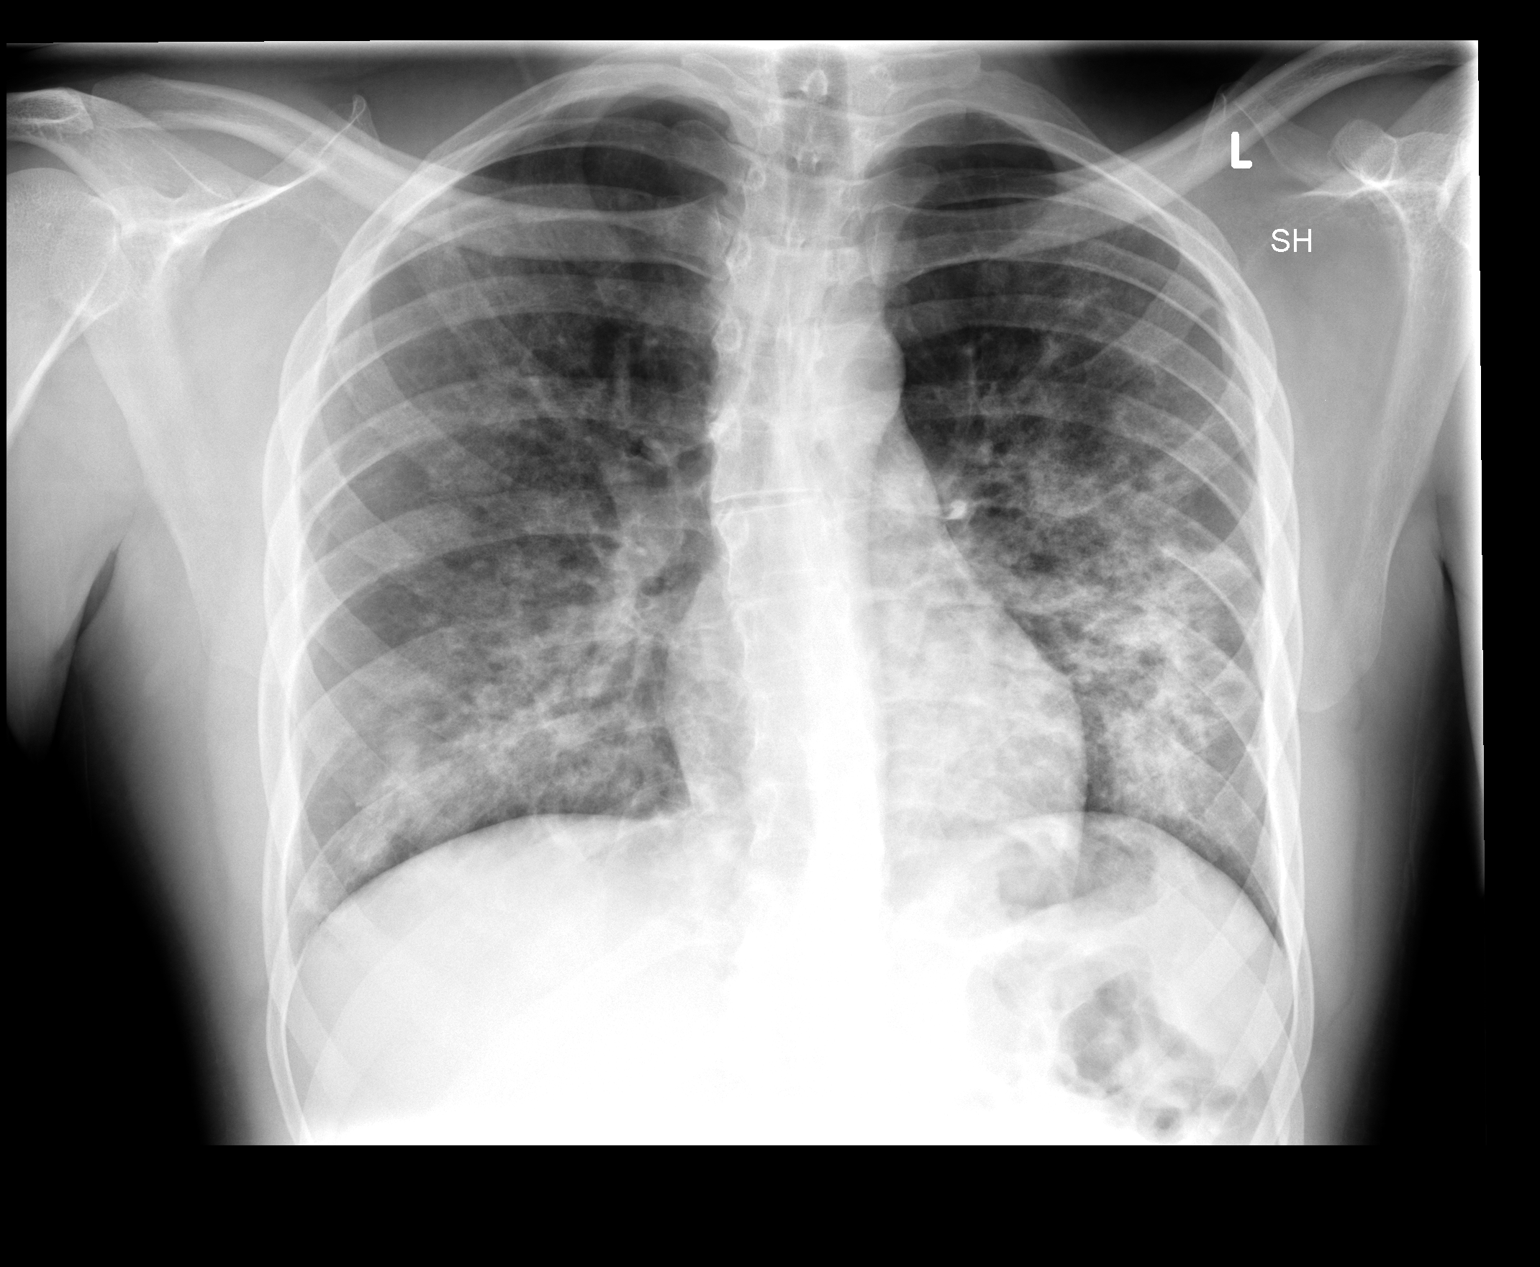

[view not recorded (2 of 2)]
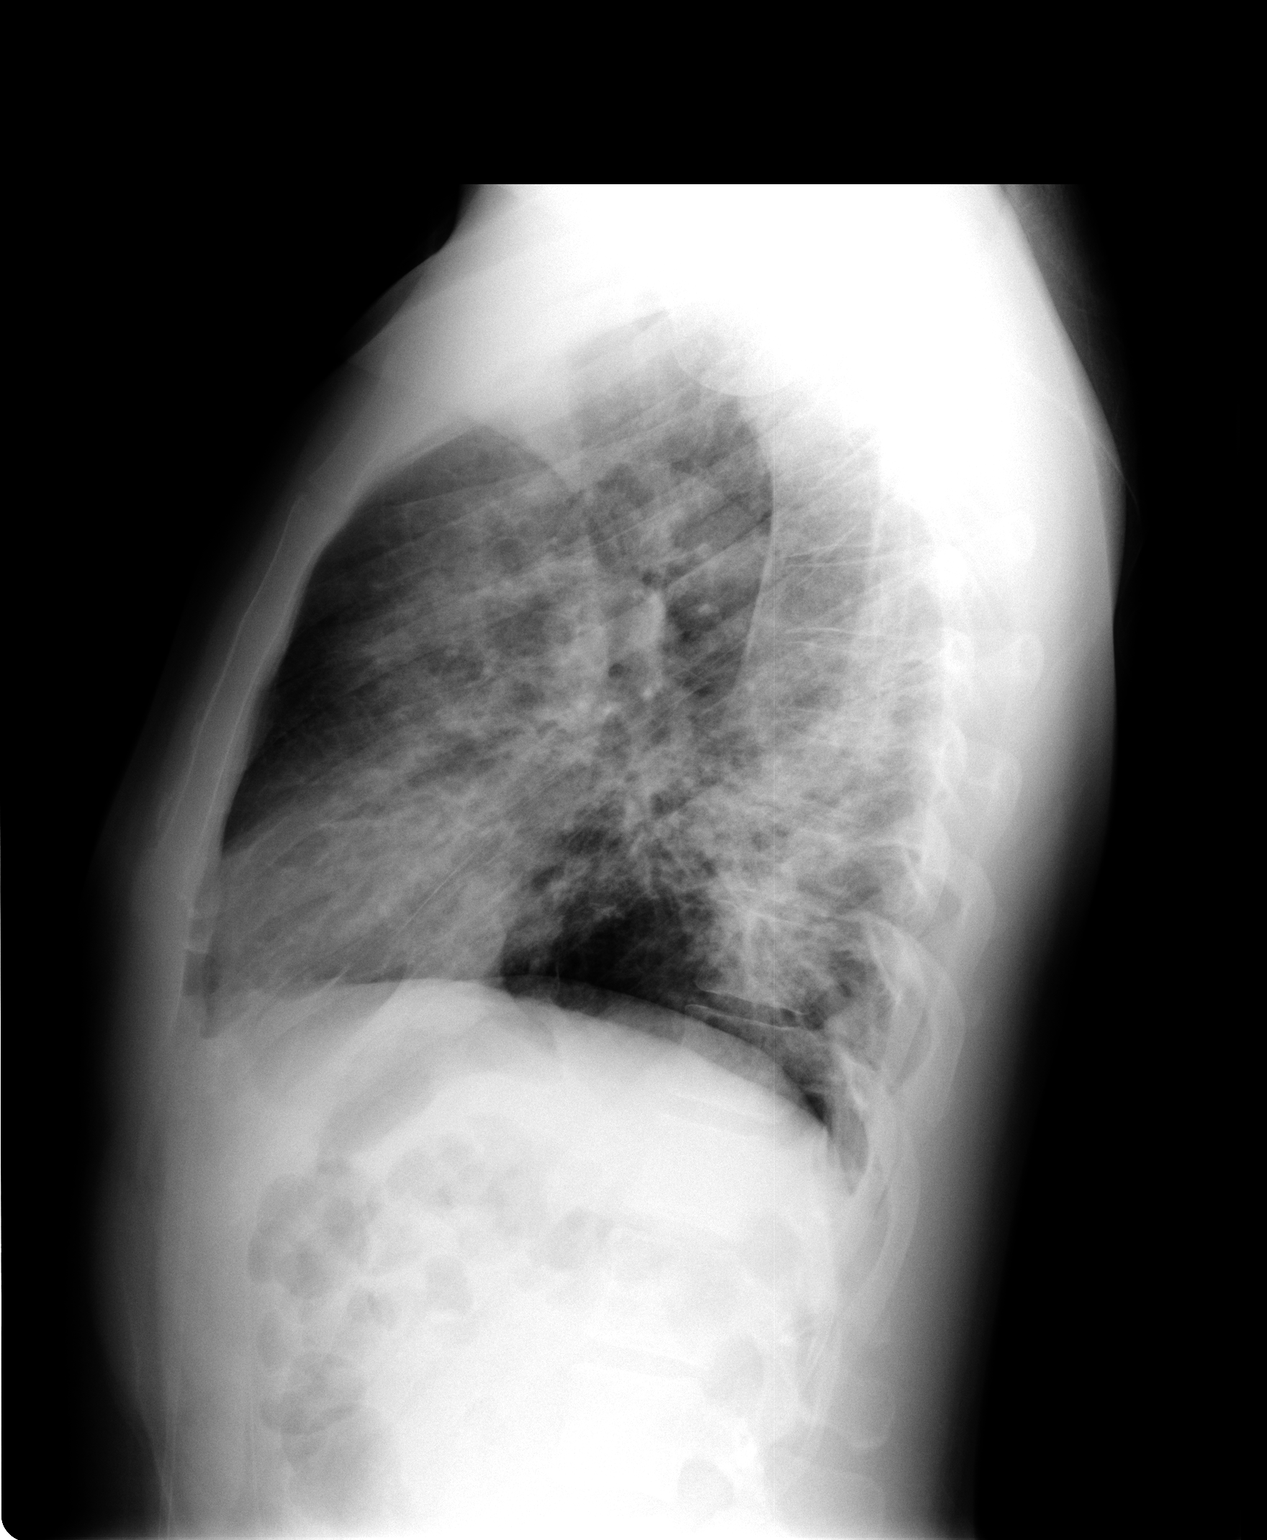

[2 of 2 positions shown; findings below may reference images not displayed]

FINDINGS: There is airspace disease throughout both lungs most
consistent with multi focal pneumonia.  No effusion is seen.
Mediastinal contours appear normal.  Mild thoracolumbar scoliosis
is stable.  The heart is within normal limits in size.
IMPRESSION: Diffuse patchy airspace disease most consistent with multi focal
pneumonia.  Recommend follow-up chest x-ray to ensure clearing.

## 2012-07-15 ENCOUNTER — Other Ambulatory Visit: Payer: Medicaid Other

## 2012-07-15 ENCOUNTER — Other Ambulatory Visit: Payer: Self-pay | Admitting: Infectious Disease

## 2012-07-15 DIAGNOSIS — B20 Human immunodeficiency virus [HIV] disease: Secondary | ICD-10-CM

## 2012-07-15 LAB — CBC WITH DIFFERENTIAL/PLATELET
HCT: 38.8 % — ABNORMAL LOW (ref 39.0–52.0)
Hemoglobin: 13.3 g/dL (ref 13.0–17.0)
Lymphocytes Relative: 47 % — ABNORMAL HIGH (ref 12–46)
Lymphs Abs: 1.6 10*3/uL (ref 0.7–4.0)
MCH: 29 pg (ref 26.0–34.0)
Monocytes Relative: 13 % — ABNORMAL HIGH (ref 3–12)
Neutro Abs: 1.2 10*3/uL — ABNORMAL LOW (ref 1.7–7.7)
Neutrophils Relative %: 36 % — ABNORMAL LOW (ref 43–77)
Platelets: 250 10*3/uL (ref 150–400)
RBC: 4.58 MIL/uL (ref 4.22–5.81)
WBC: 3.3 10*3/uL — ABNORMAL LOW (ref 4.0–10.5)

## 2012-07-15 LAB — COMPLETE METABOLIC PANEL WITH GFR
AST: 18 U/L (ref 0–37)
Albumin: 4.2 g/dL (ref 3.5–5.2)
Alkaline Phosphatase: 152 U/L — ABNORMAL HIGH (ref 39–117)
Calcium: 8.8 mg/dL (ref 8.4–10.5)
GFR, Est African American: >89 mL/min
GFR, Est Non African American: >89 mL/min
Glucose, Bld: 80 mg/dL (ref 70–99)
Potassium: 3.9 mEq/L (ref 3.5–5.3)
Sodium: 137 mEq/L (ref 135–145)
Total Protein: 6.8 g/dL (ref 6.0–8.3)

## 2012-07-15 LAB — LIPID PANEL
LDL Cholesterol: 81 mg/dL (ref 0–99)
Total CHOL/HDL Ratio: 3.3 Ratio
VLDL: 16 mg/dL (ref 0–40)

## 2012-07-16 LAB — T-HELPER CELL (CD4) - (RCID CLINIC ONLY)
CD4 % Helper T Cell: 15 % — ABNORMAL LOW (ref 33–55)
CD4 T Cell Abs: 260 uL — ABNORMAL LOW (ref 400–2700)

## 2012-07-22 LAB — HLA B*5701: HLA-B*5701: NEGATIVE

## 2012-07-29 ENCOUNTER — Ambulatory Visit (INDEPENDENT_AMBULATORY_CARE_PROVIDER_SITE_OTHER): Payer: Medicaid Other | Admitting: Infectious Disease

## 2012-07-29 ENCOUNTER — Encounter: Payer: Self-pay | Admitting: Infectious Disease

## 2012-07-29 VITALS — BP 127/83 | HR 70 | Temp 97.8°F | Ht 69.0 in | Wt 173.0 lb

## 2012-07-29 DIAGNOSIS — Z21 Asymptomatic human immunodeficiency virus [HIV] infection status: Secondary | ICD-10-CM

## 2012-07-29 DIAGNOSIS — F4321 Adjustment disorder with depressed mood: Secondary | ICD-10-CM

## 2012-07-29 DIAGNOSIS — B20 Human immunodeficiency virus [HIV] disease: Secondary | ICD-10-CM

## 2012-07-29 DIAGNOSIS — Z598 Other problems related to housing and economic circumstances: Secondary | ICD-10-CM

## 2012-07-29 DIAGNOSIS — F329 Major depressive disorder, single episode, unspecified: Secondary | ICD-10-CM

## 2012-07-29 DIAGNOSIS — F411 Generalized anxiety disorder: Secondary | ICD-10-CM

## 2012-07-29 MED ORDER — ELVITEG-COBIC-EMTRICIT-TENOFDF 150-150-200-300 MG PO TABS: 1.0000 | ORAL_TABLET | Freq: Every day | ORAL | Status: DC

## 2012-07-29 MED ORDER — ONDANSETRON HCL 4 MG PO TABS: 4.0000 mg | ORAL_TABLET | Freq: Three times a day (TID) | ORAL | Status: AC | PRN

## 2012-07-29 NOTE — Progress Notes (Signed)
Subjective:    Patient ID: Lance Craig, male    DOB: 10-30-1980, 32 y.o.   MRN: [id number]  HPI  Lance Craig is a 32 y.o. 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 He will meet with Lance Craig from [facility]  He just lost his grandmother who died at age of 56 and is grieving her loss.   I spent greater than 45 minutes with the patient including greater than 50% of time in face to face counsel of the patient and in coordination of their care.   Review of Systems  Constitutional: Negative for fever, chills, diaphoresis, activity change, appetite change, fatigue and unexpected weight change.  HENT: Negative for congestion, sore throat, rhinorrhea, sneezing, trouble swallowing and sinus pressure.   Eyes: Negative for photophobia and visual disturbance.  Respiratory: Negative for cough, chest tightness, shortness of breath, wheezing and stridor.   Cardiovascular: Negative for chest pain, palpitations and leg swelling.  Gastrointestinal: Negative for nausea, vomiting, abdominal pain, diarrhea, constipation, blood in stool, abdominal distention and anal bleeding.  Genitourinary: Negative for dysuria, hematuria, flank pain and difficulty urinating.  Musculoskeletal: Negative  for myalgias, back pain, joint swelling, arthralgias and gait problem.  Skin: Negative for color change, pallor, rash and wound.  Neurological: Negative for dizziness, tremors, weakness and light-headedness.  Hematological: Negative for adenopathy. Does not bruise/bleed easily.  Psychiatric/Behavioral: Negative for behavioral problems, confusion, sleep disturbance, dysphoric mood, decreased concentration and agitation.       Objective:   Physical Exam  Constitutional: He is oriented to person, place, and time. He appears well-developed and well-nourished. No distress.  HENT:  Head: Normocephalic and atraumatic.  Mouth/Throat: Oropharynx is clear and moist. No oropharyngeal exudate.  Eyes: Conjunctivae and EOM are normal. Pupils are equal, round, and reactive to light. No scleral icterus.  Neck: Normal range of motion. Neck supple. No JVD present.  Cardiovascular: Normal rate, regular rhythm and normal heart sounds.  Exam reveals no gallop and no friction rub.   No murmur heard. Pulmonary/Chest: Effort normal and breath sounds normal. No respiratory distress. He has no wheezes. He has no rales. He exhibits no tenderness.  Abdominal: He exhibits no distension and no mass. There is no tenderness. There is no rebound and no guarding.  Musculoskeletal: He exhibits no edema and no tenderness.  Lymphadenopathy:    He has no cervical adenopathy.  Neurological: He is alert and oriented to person, place, and time. He has normal reflexes. He exhibits normal muscle tone. Coordination normal.  Skin: Skin is warm and dry. He is not diaphoretic. No erythema. No pallor.  Psychiatric: He has a normal mood and affect. His behavior is normal. Judgment and thought content normal.  Assessment & Plan:   #1 HIV : Controlled, but see discussion re the 179D and effect on efavirenz. Change to [place]  #2 anxiety: Continue as needed benzodiazepine.  #3 Grief: supportive counselling and Lance Craig is  also option  #4 financial problems: concern about losing medicaid- -Have him meet with Lance Craig.

## 2012-08-03 ENCOUNTER — Ambulatory Visit (INDEPENDENT_AMBULATORY_CARE_PROVIDER_SITE_OTHER): Payer: Medicaid Other | Admitting: Infectious Disease

## 2012-08-03 ENCOUNTER — Telehealth: Payer: Self-pay | Admitting: *Deleted

## 2012-08-03 ENCOUNTER — Encounter: Payer: Self-pay | Admitting: Infectious Disease

## 2012-08-03 VITALS — BP 134/77 | HR 72 | Temp 98.3°F | Ht 69.0 in | Wt 173.0 lb

## 2012-08-03 DIAGNOSIS — Z21 Asymptomatic human immunodeficiency virus [HIV] infection status: Secondary | ICD-10-CM

## 2012-08-03 DIAGNOSIS — R112 Nausea with vomiting, unspecified: Secondary | ICD-10-CM

## 2012-08-03 DIAGNOSIS — B20 Human immunodeficiency virus [HIV] disease: Secondary | ICD-10-CM

## 2012-08-03 DIAGNOSIS — F4321 Adjustment disorder with depressed mood: Secondary | ICD-10-CM

## 2012-08-03 MED ORDER — EMTRICITABINE-TENOFOVIR DF 200-300 MG PO TABS: 1.0000 | ORAL_TABLET | Freq: Every day | ORAL | Status: DC

## 2012-08-03 MED ORDER — DOLUTEGRAVIR SODIUM 50 MG PO TABS: 50.0000 mg | ORAL_TABLET | Freq: Every day | ORAL | Status: DC

## 2012-08-03 NOTE — Progress Notes (Signed)
Subjective:    Patient ID: Lance Craig, male    DOB: Sep 26, 1980, 32 y.o.   MRN: [id number]  HPI  Lance Craig is a 32 y.o. 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 It reduces NVP and EFV susceptibility by 2 to 5-fold  Therefore I felt strong need to change him to THREE FULLY active drugs by change to [facility] since he prefers Qdaily drug   Saw the patient last week he has obtained hisSTRIBILD and after taking only one dose he became violently nauseous and vomited approximately 5 times. He sought evaluation in our clinic today. He did take Zofran yesterday with resolution with is not of his nausea. He is concerned however about the severity of the nausea and he is currently attending a funeral tomorrow would like to not be suffering from such symptoms. We reviewed various options including continue with this medication along with Zofran to control for nausea versus changing him to a different antiretroviral regimen namely Tivicay and truvada and we proceeded to change to that regimen.     Review of Systems  Constitutional: Negative for fever, chills, diaphoresis, activity change, appetite change, fatigue and unexpected weight change.  HENT: Negative for congestion, sore throat, rhinorrhea, sneezing, trouble swallowing and sinus pressure.   Eyes: Negative for photophobia and visual disturbance.  Respiratory: Negative for cough, chest tightness, shortness of breath, wheezing and stridor.   Cardiovascular: Negative for chest pain,  palpitations and leg swelling.  Gastrointestinal: Positive for nausea, vomiting and abdominal pain. Negative for diarrhea, constipation, blood in stool, abdominal distention and anal bleeding.  Genitourinary: Negative for dysuria, hematuria, flank pain and difficulty urinating.  Musculoskeletal: Negative for myalgias, back pain, joint swelling, arthralgias and gait problem.  Skin: Negative for color change, pallor, rash and wound.  Neurological: Negative for dizziness, tremors, weakness and light-headedness.  Hematological: Negative for adenopathy. Does not bruise/bleed easily.  Psychiatric/Behavioral: Negative for behavioral problems, confusion, sleep disturbance, dysphoric mood, decreased concentration and agitation.       Objective:   Physical Exam  Constitutional: He is oriented to person, place, and time. He appears well-developed and well-nourished. No distress.  HENT:  Head: Normocephalic and atraumatic.  Mouth/Throat: Oropharynx is clear and moist. No oropharyngeal exudate.  Eyes: Conjunctivae and EOM are normal. Pupils are equal, round, and reactive to light. No scleral icterus.  Neck: Normal range of motion. Neck supple. No JVD present.  Cardiovascular: Normal rate, regular rhythm and normal heart sounds.  Exam reveals no gallop and no friction rub.   No murmur heard. Pulmonary/Chest: Effort normal and breath sounds normal. No respiratory distress. He has no wheezes. He has no rales. He exhibits no tenderness.  Abdominal: He exhibits no distension and no mass. There is tenderness. There is no rebound and no guarding.  Musculoskeletal: He exhibits no edema and no tenderness.  Lymphadenopathy:    He has no cervical adenopathy.  Neurological: He is alert and oriented to person, place, and time. He has normal reflexes. He exhibits normal muscle tone. Coordination normal.  Skin:  Skin is warm and dry. He is not diaphoretic. No erythema. No pallor.  Psychiatric: He has a normal mood  and affect. His behavior is normal. Judgment and thought content normal.          Assessment & Plan:   #1 HIV : See above, will change to Tivicay and truvada  #2 anxiety: Continue as needed benzodiazepine.  #3 Grief: supportive counselling and Bernette Redbird is also option  #4 Nausea: zofran if necessary should resolve with stopping the [facility]

## 2012-08-03 NOTE — Telephone Encounter (Signed)
Called patient back after speaking with Dr Daiva Eves and he advised to get the patient in today. Called patient and advised him to be here at 145 pm. Advised him is a work in so be prepared to wait but sooner in, sooner out. And he was ok with that. Will have the patient added to schedule.

## 2012-08-03 NOTE — Telephone Encounter (Signed)
We will see pt today 

## 2012-08-03 NOTE — Telephone Encounter (Signed)
Patient called and advised that he got really sick and vomited after he took his Stribild for the first time. He advised he took it at 7 am and got sick at 12 pm. Advised he spoke with the doctor on call and was told to call for an today. Advised he is not taking anymore of the medication until he speaks with Dr Daiva Eves and wants to know if he can go back to Atripla. Advised patient if he was taken off will have to talk to Dr Daiva Eves about but in the mean time will need to ask Dr Daiva Eves what he needs to do and give him a call back. Advised the patient that he may just have to take the Stribild a few more times with the Zofran until his body get used to the medication this may ba a short term reaction to a new medication so not to rule it out. Advised will ask Dr Daiva Eves and give him a call back.

## 2012-08-19 ENCOUNTER — Other Ambulatory Visit: Payer: Self-pay | Admitting: Infectious Disease

## 2012-08-19 ENCOUNTER — Other Ambulatory Visit (INDEPENDENT_AMBULATORY_CARE_PROVIDER_SITE_OTHER): Payer: Medicaid Other

## 2012-08-19 DIAGNOSIS — Z21 Asymptomatic human immunodeficiency virus [HIV] infection status: Secondary | ICD-10-CM

## 2012-08-19 DIAGNOSIS — B20 Human immunodeficiency virus [HIV] disease: Secondary | ICD-10-CM

## 2012-08-19 LAB — COMPLETE METABOLIC PANEL WITH GFR
Alkaline Phosphatase: 149 U/L — ABNORMAL HIGH (ref 39–117)
Creat: 1.02 mg/dL (ref 0.50–1.35)
GFR, Est African American: >89 mL/min
GFR, Est Non African American: >89 mL/min
Glucose, Bld: 95 mg/dL (ref 70–99)
Sodium: 141 mEq/L (ref 135–145)
Total Bilirubin: 0.2 mg/dL — ABNORMAL LOW (ref 0.3–1.2)
Total Protein: 6.1 g/dL (ref 6.0–8.3)

## 2012-08-19 LAB — CBC WITH DIFFERENTIAL/PLATELET
Basophils Relative: 1 % (ref 0–1)
Eosinophils Absolute: 0.2 10*3/uL (ref 0.0–0.7)
Eosinophils Relative: 4 % (ref 0–5)
MCH: 29.1 pg (ref 26.0–34.0)
MCHC: 34.2 g/dL (ref 30.0–36.0)
MCV: 85.1 fL (ref 78.0–100.0)
Monocytes Relative: 11 % (ref 3–12)
Neutrophils Relative %: 41 % — ABNORMAL LOW (ref 43–77)
Platelets: 230 10*3/uL (ref 150–400)

## 2012-08-20 LAB — HIV-1 RNA QUANT-NO REFLEX-BLD
HIV 1 RNA Quant: <20 copies/mL (ref <20)
HIV-1 RNA Quant, Log: <1.3 {Log} (ref <1.30)

## 2012-08-20 LAB — T-HELPER CELL (CD4) - (RCID CLINIC ONLY)
CD4 % Helper T Cell: 18 % — ABNORMAL LOW (ref 33–55)
CD4 T Cell Abs: 300 uL — ABNORMAL LOW (ref 400–2700)

## 2012-08-31 ENCOUNTER — Other Ambulatory Visit: Payer: Self-pay | Admitting: Internal Medicine

## 2012-09-02 ENCOUNTER — Ambulatory Visit: Payer: Medicaid Other | Admitting: Infectious Disease

## 2012-09-03 ENCOUNTER — Telehealth: Payer: Self-pay | Admitting: *Deleted

## 2012-09-03 NOTE — Telephone Encounter (Signed)
Called patient to try and reschedule his missed appt from 09/02/12 was unable to get on phone but left a message for the patient to call and reschedule asap.

## 2012-09-22 ENCOUNTER — Ambulatory Visit: Payer: Self-pay

## 2012-09-24 ENCOUNTER — Other Ambulatory Visit: Payer: Self-pay | Admitting: *Deleted

## 2012-09-24 ENCOUNTER — Ambulatory Visit: Payer: Self-pay

## 2012-09-24 DIAGNOSIS — B20 Human immunodeficiency virus [HIV] disease: Secondary | ICD-10-CM

## 2012-09-24 MED ORDER — EMTRICITABINE-TENOFOVIR DF 200-300 MG PO TABS: 1.0000 | ORAL_TABLET | Freq: Every day | ORAL | Status: DC

## 2012-09-24 MED ORDER — DOLUTEGRAVIR SODIUM 50 MG PO TABS: 50.0000 mg | ORAL_TABLET | Freq: Every day | ORAL | Status: DC

## 2012-09-24 NOTE — Telephone Encounter (Signed)
ADAP 

## 2012-10-06 ENCOUNTER — Other Ambulatory Visit: Payer: Self-pay | Admitting: *Deleted

## 2012-10-06 DIAGNOSIS — Z21 Asymptomatic human immunodeficiency virus [HIV] infection status: Secondary | ICD-10-CM

## 2012-10-06 DIAGNOSIS — B20 Human immunodeficiency virus [HIV] disease: Secondary | ICD-10-CM

## 2012-10-06 MED ORDER — EMTRICITABINE-TENOFOVIR DF 200-300 MG PO TABS: 1.0000 | ORAL_TABLET | Freq: Every day | ORAL | Status: DC

## 2012-10-06 MED ORDER — DOLUTEGRAVIR SODIUM 50 MG PO TABS: 50.0000 mg | ORAL_TABLET | Freq: Every day | ORAL | Status: DC

## 2012-10-14 ENCOUNTER — Encounter: Payer: Self-pay | Admitting: Infectious Disease

## 2012-10-14 ENCOUNTER — Ambulatory Visit (INDEPENDENT_AMBULATORY_CARE_PROVIDER_SITE_OTHER): Payer: Self-pay | Admitting: Infectious Disease

## 2012-10-14 VITALS — BP 121/82 | HR 56 | Temp 97.6°F | Wt 178.0 lb

## 2012-10-14 DIAGNOSIS — M545 Low back pain, unspecified: Secondary | ICD-10-CM

## 2012-10-14 DIAGNOSIS — B20 Human immunodeficiency virus [HIV] disease: Secondary | ICD-10-CM

## 2012-10-14 DIAGNOSIS — I82409 Acute embolism and thrombosis of unspecified deep veins of unspecified lower extremity: Secondary | ICD-10-CM | POA: Insufficient documentation

## 2012-10-14 DIAGNOSIS — Z21 Asymptomatic human immunodeficiency virus [HIV] infection status: Secondary | ICD-10-CM

## 2012-10-14 MED ORDER — EMTRICITABINE-TENOFOVIR DF 200-300 MG PO TABS: 1.0000 | ORAL_TABLET | Freq: Every day | ORAL | Status: DC

## 2012-10-14 MED ORDER — TRAMADOL HCL 50 MG PO TABS: 50.0000 mg | ORAL_TABLET | Freq: Three times a day (TID) | ORAL | Status: DC | PRN

## 2012-10-14 MED ORDER — DOLUTEGRAVIR SODIUM 50 MG PO TABS: 50.0000 mg | ORAL_TABLET | Freq: Every day | ORAL | Status: DC

## 2012-10-14 NOTE — Progress Notes (Signed)
Subjective:    Patient ID: Lance Craig, male    DOB: 08/21/1980, 32 y.o.   MRN: [id number]  HPI   Lance Craig is a 32 y.o. 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 It reduces NVP and EFV susceptibility by 2 to 5-fold  Therefore I felt strong need to change him to THREE FULLY active drugs by change to [facility] since he prefers Qdaily drug however he did not tolerate the STRIBILD  well do to severe nausea and he has been changed over to TIVICAY and Truvada.     he is tolerating his tube medications quite well and has an undetectable viral load and healthy CD4 count when checked in late June. Unfortunately he did lose his Medicaid and ran out of medications approximately 2 weeks ago. He brought in all the proper paperwork to [facility] and has now apparently successfully enrolled in the AIDS drug assistance program. He believes his medications will be available for pickup today.  Otherwise he is doing relatively well other than some low back pain which is being exacerbated by lifting objects at work.     he is trying Tylenol for this but no stronger medications and advised the use of ibuprofen for this with adequate hydration. If this is not controlled with those measures I would give him a trial of Ultram but would not want to be prescribing this long-term to the patient.     Review of Systems  Constitutional: Negative for fever, chills, diaphoresis, activity change, appetite change, fatigue and unexpected weight change.   HENT: Negative for congestion, sore throat, rhinorrhea, sneezing, trouble swallowing and sinus pressure.   Eyes: Negative for photophobia and visual disturbance.  Respiratory: Negative for cough, chest tightness, shortness of breath, wheezing and stridor.   Cardiovascular: Negative for chest pain, palpitations and leg swelling.  Gastrointestinal: Negative for nausea, vomiting, abdominal pain, diarrhea, constipation, blood in stool, abdominal distention and anal bleeding.  Genitourinary: Negative for dysuria, hematuria, flank pain and difficulty urinating.  Musculoskeletal: Positive for back pain. Negative for myalgias, joint swelling, arthralgias and gait problem.  Skin: Negative for color change, pallor, rash and wound.  Neurological: Negative for dizziness, tremors, weakness and light-headedness.  Hematological: Negative for adenopathy. Does not bruise/bleed easily.  Psychiatric/Behavioral: Negative for behavioral problems, confusion, sleep disturbance, dysphoric mood, decreased concentration and agitation.       Objective:   Physical Exam  Constitutional: He is oriented to person, place, and time. He appears well-developed and well-nourished. No distress.  HENT:  Head: Normocephalic and atraumatic.  Mouth/Throat: Oropharynx is clear and moist. No oropharyngeal exudate.  Eyes: Conjunctivae and EOM are normal. Pupils are equal, round, and reactive to light. No scleral icterus.  Neck: Normal range of motion. Neck supple. No JVD present.  Cardiovascular: Normal rate, regular rhythm and normal heart sounds.  Exam reveals no gallop and no friction rub.   No murmur heard. Pulmonary/Chest: Effort normal and breath sounds normal. No respiratory distress. He has no wheezes. He has no rales. He exhibits no tenderness.  Abdominal: He exhibits no distension and no mass. There is tenderness. There is no rebound and no guarding.  Musculoskeletal: He exhibits no edema and no tenderness.   Lymphadenopathy:    He has no cervical adenopathy.  Neurological: He is alert and oriented to person, place, and time. He has normal reflexes. He exhibits normal muscle tone. Coordination normal.  Skin: Skin is warm and dry. He is not diaphoretic. No erythema. No pallor.  Psychiatric: He has a normal mood and affect. His behavior is normal. Judgment and thought content normal.          Assessment & Plan:   #1 HIV continue Tivicay and truvada recheck labs in early October with followup at that point in time.  #2 lower back pain. Try over-the-counter nonsteroidals and if these are ineffective can try tramadol.

## 2012-12-02 ENCOUNTER — Other Ambulatory Visit (INDEPENDENT_AMBULATORY_CARE_PROVIDER_SITE_OTHER): Payer: Self-pay

## 2012-12-02 DIAGNOSIS — B20 Human immunodeficiency virus [HIV] disease: Secondary | ICD-10-CM

## 2012-12-02 LAB — CBC WITH DIFFERENTIAL/PLATELET
Basophils Absolute: 0 10*3/uL (ref 0.0–0.1)
Basophils Relative: 1 % (ref 0–1)
Eosinophils Relative: 3 % (ref 0–5)
HCT: 39.1 % (ref 39.0–52.0)
MCHC: 34.3 g/dL (ref 30.0–36.0)
Monocytes Absolute: 0.4 10*3/uL (ref 0.1–1.0)
Neutro Abs: 1.2 10*3/uL — ABNORMAL LOW (ref 1.7–7.7)
Platelets: 268 10*3/uL (ref 150–400)
RDW: 13.7 % (ref 11.5–15.5)

## 2012-12-02 LAB — COMPLETE METABOLIC PANEL WITH GFR
AST: 21 U/L (ref 0–37)
Alkaline Phosphatase: 94 U/L (ref 39–117)
BUN: 11 mg/dL (ref 6–23)
Creat: 1.19 mg/dL (ref 0.50–1.35)
Potassium: 4.2 mEq/L (ref 3.5–5.3)

## 2012-12-03 LAB — T-HELPER CELL (CD4) - (RCID CLINIC ONLY)
CD4 % Helper T Cell: 14 % — ABNORMAL LOW (ref 33–55)
CD4 T Cell Abs: 290 /uL — ABNORMAL LOW (ref 400–2700)

## 2012-12-16 ENCOUNTER — Ambulatory Visit (INDEPENDENT_AMBULATORY_CARE_PROVIDER_SITE_OTHER): Payer: Self-pay | Admitting: Infectious Disease

## 2012-12-16 ENCOUNTER — Encounter: Payer: Self-pay | Admitting: Infectious Disease

## 2012-12-16 VITALS — BP 106/55 | HR 77 | Temp 98.2°F | Ht 69.0 in | Wt 185.0 lb

## 2012-12-16 DIAGNOSIS — M545 Low back pain, unspecified: Secondary | ICD-10-CM

## 2012-12-16 DIAGNOSIS — B59 Pneumocystosis: Secondary | ICD-10-CM | POA: Insufficient documentation

## 2012-12-16 DIAGNOSIS — B20 Human immunodeficiency virus [HIV] disease: Secondary | ICD-10-CM

## 2012-12-16 DIAGNOSIS — Z23 Encounter for immunization: Secondary | ICD-10-CM

## 2012-12-16 DIAGNOSIS — Z21 Asymptomatic human immunodeficiency virus [HIV] infection status: Secondary | ICD-10-CM

## 2012-12-16 DIAGNOSIS — I82401 Acute embolism and thrombosis of unspecified deep veins of right lower extremity: Secondary | ICD-10-CM

## 2012-12-16 DIAGNOSIS — I82409 Acute embolism and thrombosis of unspecified deep veins of unspecified lower extremity: Secondary | ICD-10-CM

## 2012-12-16 MED ORDER — ABACAVIR-DOLUTEGRAVIR-LAMIVUD 600-50-300 MG PO TABS: 1.0000 | ORAL_TABLET | Freq: Every day | ORAL | Status: DC

## 2012-12-16 NOTE — Progress Notes (Signed)
Subjective:    Patient ID: Lance Craig, male    DOB: 1980-04-26, 32 y.o.   MRN: [id number]  HPI   Lance Craig is a 32 y.o. 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 It reduces NVP and EFV susceptibility by 2 to 5-fold  Therefore I felt strong need to change him to THREE FULLY active drugs by changed to [facility] since he prefers Qdaily drug however he did not tolerate the STRIBILD  well do to severe nausea and he has been changed over to TIVICAY and Truvada which he has tolerated fine. I propose provided Triumeq today..      Review of Systems  Constitutional: Negative for fever, chills, diaphoresis, activity change, appetite change, fatigue and unexpected weight change.  HENT: Negative for congestion, sore throat, rhinorrhea, sneezing, trouble swallowing and sinus pressure.   Eyes: Negative for photophobia and visual disturbance.  Respiratory: Negative for cough, chest tightness, shortness of breath, wheezing and stridor.   Cardiovascular: Negative for chest pain, palpitations and leg swelling.  Gastrointestinal: Negative for nausea, vomiting, abdominal pain, diarrhea, constipation, blood in stool, abdominal distention and anal bleeding.  Genitourinary: Negative for dysuria, hematuria, flank pain and difficulty urinating.  Musculoskeletal: Positive for back pain. Negative for myalgias, joint swelling, arthralgias and gait problem.  Skin: Negative for color change, pallor, rash and wound.  Neurological: Negative for  dizziness, tremors, weakness and light-headedness.  Hematological: Negative for adenopathy. Does not bruise/bleed easily.  Psychiatric/Behavioral: Negative for behavioral problems, confusion, sleep disturbance, dysphoric mood, decreased concentration and agitation.       Objective:   Physical Exam  Constitutional: He is oriented to person, place, and time. He appears well-developed and well-nourished. No distress.  HENT:  Head: Normocephalic and atraumatic.  Mouth/Throat: Oropharynx is clear and moist. No oropharyngeal exudate.  Eyes: Conjunctivae and EOM are normal. Pupils are equal, round, and reactive to light. No scleral icterus.  Neck: Normal range of motion. Neck supple. No JVD present.  Cardiovascular: Normal rate, regular rhythm and normal heart sounds.  Exam reveals no gallop and no friction rub.   No murmur heard. Pulmonary/Chest: Effort normal and breath sounds normal. No respiratory distress. He has no wheezes. He has no rales. He exhibits no tenderness.  Abdominal: He exhibits no distension and no mass. There is tenderness. There is no rebound and no guarding.  Musculoskeletal: He exhibits no edema and no tenderness.  Lymphadenopathy:    He has no cervical adenopathy.  Neurological: He is alert and oriented to person, place, and time. He has normal reflexes. He exhibits normal muscle tone. Coordination normal.  Skin: Skin is warm and dry. He is not diaphoretic. No erythema. No pallor.  Psychiatric: He has a normal mood and affect. His behavior is normal. Judgment and thought content normal.          Assessment & Plan:   #1 Change to TRIUMEQ, CD4 is above 300 now!! I spent greater than 45 minutes with the patient including greater than 50% of time in face to face counsel  of the patient and in coordination of their care.   #2HCM: flu shot  #3 DVT: finished coumadin  #4 LUmbago: not an issue at present

## 2012-12-23 ENCOUNTER — Telehealth: Payer: Self-pay | Admitting: *Deleted

## 2012-12-23 NOTE — Telephone Encounter (Signed)
Patient called Friday 10/3 with questions about the side effects (rash, death specifically) of his new ART Triumeq and felt concerned that he was trying a brand new drug. RN answered his questions, stating that Triumeq was a single pill combination of two existing pills that had been used together for ART.  Patient felt more comfortable that the medication was not a brand new therapy, just a single tablet option.  Patient stated Friday that he had a week of his previous medications left and felt comfortable taking the new medication.   Patient called today 10/8 stating that he spoke with someone yesterday (he couldn't remember who) and said he wasn't going to take the Triumeq.  He is out of his old regimen (last dose was Monday) and wants to switch back.  Please advise if this is ok. Andree Coss, RN

## 2012-12-23 NOTE — Telephone Encounter (Signed)
He can switch back to [place] and truvada. Truvada has TNF back bone unlike TRiumeq. There is concern about risk for CAD with the ABC that is in that TRIUMEQ (this risk is actively debated).  and I had gone over that before. Fine to swithc back to Tivicay and Truvada.   IF he doesn't have much in way of risks for CAD which he does not I would feel fine with him being on Triumeq. But if he wishes to stay on Tivicay and Truvada that is a perfectly fine option (probably a better drug combo but just in two pills)

## 2012-12-24 ENCOUNTER — Other Ambulatory Visit: Payer: Self-pay | Admitting: *Deleted

## 2012-12-24 DIAGNOSIS — B20 Human immunodeficiency virus [HIV] disease: Secondary | ICD-10-CM

## 2012-12-24 MED ORDER — EMTRICITABINE-TENOFOVIR DF 200-300 MG PO TABS: 1.0000 | ORAL_TABLET | Freq: Every day | ORAL | Status: DC

## 2012-12-24 MED ORDER — DOLUTEGRAVIR SODIUM 50 MG PO TABS: 50.0000 mg | ORAL_TABLET | Freq: Every day | ORAL | Status: DC

## 2012-12-24 NOTE — Telephone Encounter (Signed)
thanks

## 2012-12-24 NOTE — Telephone Encounter (Signed)
Old regimen of Tivicay and Truvada sent to the pharmacy. Pt notified, he will pick it up this morning.  Thanks!

## 2013-01-21 ENCOUNTER — Other Ambulatory Visit: Payer: Self-pay

## 2013-02-01 ENCOUNTER — Other Ambulatory Visit (INDEPENDENT_AMBULATORY_CARE_PROVIDER_SITE_OTHER): Payer: Self-pay

## 2013-02-01 DIAGNOSIS — B20 Human immunodeficiency virus [HIV] disease: Secondary | ICD-10-CM

## 2013-02-01 DIAGNOSIS — Z21 Asymptomatic human immunodeficiency virus [HIV] infection status: Secondary | ICD-10-CM

## 2013-02-01 LAB — CBC WITH DIFFERENTIAL/PLATELET
Basophils Relative: 1 % (ref 0–1)
Eosinophils Absolute: 0.2 10*3/uL (ref 0.0–0.7)
Eosinophils Relative: 3 % (ref 0–5)
Lymphocytes Relative: 41 % (ref 12–46)
Lymphs Abs: 1.8 10*3/uL (ref 0.7–4.0)
MCH: 29 pg (ref 26.0–34.0)
MCHC: 34.2 g/dL (ref 30.0–36.0)
MCV: 84.8 fL (ref 78.0–100.0)
Neutrophils Relative %: 43 % (ref 43–77)
Platelets: 222 10*3/uL (ref 150–400)

## 2013-02-01 LAB — COMPLETE METABOLIC PANEL WITH GFR
ALT: 14 U/L (ref 0–53)
Alkaline Phosphatase: 82 U/L (ref 39–117)
CO2: 26 mEq/L (ref 19–32)
Creat: 1.37 mg/dL — ABNORMAL HIGH (ref 0.50–1.35)
GFR, Est African American: 79 mL/min
GFR, Est Non African American: 68 mL/min
Glucose, Bld: 94 mg/dL (ref 70–99)
Sodium: 137 mEq/L (ref 135–145)
Total Bilirubin: 0.5 mg/dL (ref 0.3–1.2)
Total Protein: 6.8 g/dL (ref 6.0–8.3)

## 2013-02-02 LAB — T-HELPER CELL (CD4) - (RCID CLINIC ONLY): CD4 % Helper T Cell: 16 % — ABNORMAL LOW (ref 33–55)

## 2013-02-04 LAB — HIV-1 RNA QUANT-NO REFLEX-BLD
HIV 1 RNA Quant: <20 copies/mL (ref <20)
HIV-1 RNA Quant, Log: <1.3 {Log} (ref <1.30)

## 2013-02-15 ENCOUNTER — Encounter: Payer: Self-pay | Admitting: Infectious Disease

## 2013-02-15 ENCOUNTER — Ambulatory Visit (INDEPENDENT_AMBULATORY_CARE_PROVIDER_SITE_OTHER): Payer: Self-pay | Admitting: Infectious Disease

## 2013-02-15 VITALS — BP 121/75 | HR 50 | Temp 98.1°F | Wt 186.0 lb

## 2013-02-15 DIAGNOSIS — B20 Human immunodeficiency virus [HIV] disease: Secondary | ICD-10-CM

## 2013-02-15 DIAGNOSIS — M545 Low back pain, unspecified: Secondary | ICD-10-CM

## 2013-02-15 DIAGNOSIS — Z21 Asymptomatic human immunodeficiency virus [HIV] infection status: Secondary | ICD-10-CM

## 2013-02-15 LAB — BASIC METABOLIC PANEL WITH GFR
BUN: 11 mg/dL (ref 6–23)
Calcium: 9.5 mg/dL (ref 8.4–10.5)
GFR, Est African American: >89 mL/min
Glucose, Bld: 67 mg/dL — ABNORMAL LOW (ref 70–99)
Potassium: 3.8 mEq/L (ref 3.5–5.3)
Sodium: 140 mEq/L (ref 135–145)

## 2013-02-15 NOTE — Progress Notes (Signed)
Subjective:    Patient ID: Lance Craig, male    DOB: May 02, 1980, 32 y.o.   MRN: [id number]  HPI   Lance Craig is a 32 y.o. 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 It reduces NVP and EFV susceptibility by 2 to 5-fold  Therefore I felt strong need to change him to THREE FULLY active drugs by changed to [facility] since he prefers Qdaily drug however he did not tolerate the STRIBILD  well do to severe nausea and he has been changed over to TIVICAY and Truvada which he has tolerated fine I tried to change him over to [facility] and felt safe about this change and the fact that he does not have significant risk factors for coronary disease and he is HLA [id number] negative. However after leaving his last visit he felt anxious when reading the product description regarding side effects of abacavir and he decided to status current regimen. We discussed risks of abacavir for coronary disease and specific risks that Lance Craig hasn't is more severe he does not have. But he still was to remain on his current therapy       Review of Systems  Constitutional: Negative for fever, chills, diaphoresis, activity change, appetite change, fatigue and unexpected weight change.  HENT: Negative for congestion, rhinorrhea, sinus pressure, sneezing, sore throat and trouble swallowing.   Eyes: Negative for photophobia and visual disturbance.  Respiratory: Negative for cough, chest tightness, shortness of breath, wheezing and stridor.   Cardiovascular:  Negative for chest pain, palpitations and leg swelling.  Gastrointestinal: Negative for nausea, vomiting, abdominal pain, diarrhea, constipation, blood in stool, abdominal distention and anal bleeding.  Genitourinary: Negative for dysuria, hematuria, flank pain and difficulty urinating.  Musculoskeletal: Positive for back pain. Negative for arthralgias, gait problem, joint swelling and myalgias.  Skin: Negative for color change, pallor, rash and wound.  Neurological: Negative for dizziness, tremors, weakness and light-headedness.  Hematological: Negative for adenopathy. Does not bruise/bleed easily.  Psychiatric/Behavioral: Negative for behavioral problems, confusion, sleep disturbance, dysphoric mood, decreased concentration and agitation.       Objective:   Physical Exam  Constitutional: He is oriented to person, place, and time. He appears well-developed and well-nourished. No distress.  HENT:  Head: Normocephalic and atraumatic.  Mouth/Throat: Oropharynx is clear and moist. No oropharyngeal exudate.  Eyes: Conjunctivae and EOM are normal. Pupils are equal, round, and reactive to light. No scleral icterus.  Neck: Normal range of motion. Neck supple. No JVD present.  Cardiovascular: Normal rate, regular rhythm and normal heart sounds.  Exam reveals no gallop and no friction rub.   No murmur heard. Pulmonary/Chest: Effort normal and breath sounds normal. No respiratory distress. He has no wheezes. He has no rales. He exhibits no tenderness.  Abdominal: He exhibits no distension and no mass. There is tenderness. There is no rebound and no guarding.  Musculoskeletal: He exhibits no edema and no tenderness.  Lymphadenopathy:    He has no cervical adenopathy.  Neurological: He is alert and oriented to person, place, and  time. He has normal reflexes. He exhibits normal muscle tone. Coordination normal.  Skin: Skin is warm and dry. He is not diaphoretic. No erythema. No pallor.  Psychiatric:  He has a normal mood and affect. His behavior is normal. Judgment and thought content normal.          Assessment & Plan:   #1 He wished to remain on Tivicay and Truvada for now.  I spent greater than 45 minutes with the patient including greater than 50% of time in face to face counsel of the patient and in coordination of their care.   #2HCM: flu shot up todate  # LUmbago: not an issue at present

## 2013-04-07 ENCOUNTER — Ambulatory Visit: Payer: Self-pay

## 2013-05-14 ENCOUNTER — Encounter: Payer: Self-pay | Admitting: *Deleted

## 2013-05-14 ENCOUNTER — Ambulatory Visit: Payer: Self-pay

## 2013-05-15 ENCOUNTER — Emergency Department (HOSPITAL_BASED_OUTPATIENT_CLINIC_OR_DEPARTMENT_OTHER)
Admission: EM | Admit: 2013-05-15 | Discharge: 2013-05-15 | Disposition: A | Discharge status: Home / Self Care | Payer: Self-pay | Attending: Emergency Medicine | Admitting: Emergency Medicine

## 2013-05-15 ENCOUNTER — Encounter (HOSPITAL_BASED_OUTPATIENT_CLINIC_OR_DEPARTMENT_OTHER): Payer: Self-pay | Admitting: Emergency Medicine

## 2013-05-15 DIAGNOSIS — Z21 Asymptomatic human immunodeficiency virus [HIV] infection status: Secondary | ICD-10-CM | POA: Insufficient documentation

## 2013-05-15 DIAGNOSIS — L02414 Cutaneous abscess of left upper limb: Secondary | ICD-10-CM

## 2013-05-15 DIAGNOSIS — Z8619 Personal history of other infectious and parasitic diseases: Secondary | ICD-10-CM | POA: Insufficient documentation

## 2013-05-15 DIAGNOSIS — IMO0002 Reserved for concepts with insufficient information to code with codable children: Secondary | ICD-10-CM | POA: Insufficient documentation

## 2013-05-15 DIAGNOSIS — Z79899 Other long term (current) drug therapy: Secondary | ICD-10-CM | POA: Insufficient documentation

## 2013-05-15 DIAGNOSIS — F329 Major depressive disorder, single episode, unspecified: Secondary | ICD-10-CM | POA: Insufficient documentation

## 2013-05-15 DIAGNOSIS — F411 Generalized anxiety disorder: Secondary | ICD-10-CM | POA: Insufficient documentation

## 2013-05-15 DIAGNOSIS — Z862 Personal history of diseases of the blood and blood-forming organs and certain disorders involving the immune mechanism: Secondary | ICD-10-CM | POA: Insufficient documentation

## 2013-05-15 DIAGNOSIS — Z86718 Personal history of other venous thrombosis and embolism: Secondary | ICD-10-CM | POA: Insufficient documentation

## 2013-05-15 DIAGNOSIS — F3289 Other specified depressive episodes: Secondary | ICD-10-CM | POA: Insufficient documentation

## 2013-05-15 MED ORDER — SULFAMETHOXAZOLE-TRIMETHOPRIM 800-160 MG PO TABS: ORAL_TABLET | ORAL | Status: DC

## 2013-05-15 MED ORDER — HYDROCODONE-ACETAMINOPHEN 5-325 MG PO TABS: 2.0000 | ORAL_TABLET | ORAL | Status: DC | PRN

## 2013-05-15 NOTE — ED Provider Notes (Addendum)
CSN: [id number]     Arrival date & time 05/15/13  1318 History   First MD Initiated Contact with Patient 05/15/13 1327     Chief Complaint  Patient presents with  . Recurrent Skin Infections     (Consider location/radiation/quality/duration/timing/severity/associated sxs/prior Treatment) Patient is a 33 y.o. male presenting with abscess. The history is provided by the patient.  Abscess Location:  Shoulder/arm Shoulder/arm abscess location:  L upper arm Abscess quality: fluctuance, painful, redness and warmth  Induration: 5 cm.   Red streaking: no   Duration:  4 days Progression:  Worsening Pain details:    Quality:  Throbbing   Timing:  Constant   Progression:  Worsening Chronicity:  New Context: immunosuppression   Relieved by:  Nothing Ineffective treatments:  Warm compresses Risk factors: prior abscess    Lance Craig is a 33 y.o. male who presents to the ED with an abscess to the left upper arm. He has been using warm wet compresses to the area and it has gotten much larger but has not drained. He has had abscesses in the past that required I&D. His PMH is significant for HIV.   Past Medical History  Diagnosis Date  . HIV infection   . Deep venous thrombosis   . Depression   . Anemia   . Anxiety   . PCP (pneumocystis jiroveci pneumonia)    History reviewed. No pertinent past surgical history. No family history on file. History  Substance Use Topics  . Smoking status: Never Smoker   . Smokeless tobacco: Never Used  . Alcohol Use: 0.5 oz/week    1 drink(s) per week     Comment: wine    Review of Systems Negative except as stated in HPI  Allergies  Zithromax  Home Medications   Current Outpatient Rx  Name  Route  Sig  Dispense  Refill  . dolutegravir (TIVICAY) 50 MG tablet   Oral   Take 1 tablet (50 mg total) by mouth daily.   30 tablet   11   . emtricitabine-tenofovir (TRUVADA) 200-300 MG per tablet   Oral   Take 1 tablet by mouth daily.   30  tablet   11   . loratadine (CLARITIN) 10 MG tablet      TAKE 1 TABLET BY MOUTH DAILY   30 tablet   0   . LORazepam (ATIVAN) 0.5 MG tablet   Oral   Take 1 tablet (0.5 mg total) by mouth 2 (two) times daily as needed for anxiety. Take 1/2 every 8 hours as needed   30 tablet   4   . ondansetron (ZOFRAN) 4 MG tablet   Oral   Take 1 tablet (4 mg total) by mouth every 8 (eight) hours as needed for nausea.   60 tablet   2   . traMADol (ULTRAM) 50 MG tablet   Oral   Take 1 tablet (50 mg total) by mouth every 8 (eight) hours as needed for pain.   60 tablet   1    BP 132/73  Pulse 70  Temp(Src) 98.3 F (36.8 C) (Oral)  Resp 18  Ht 5\' 9"  (1.753 m)  Wt 180 lb (81.647 kg)  BMI 26.57 kg/m2  SpO2 100% Physical Exam  Nursing note and vitals reviewed. Constitutional: He is oriented to person, place, and time. He appears well-developed and well-nourished. No distress.  HENT:  Head: Normocephalic.  Eyes: EOM are normal.  Neck: Neck supple.  Cardiovascular: Normal rate.   Pulmonary/Chest:  Effort normal.  Musculoskeletal: Normal range of motion.  Raised, tender area with erythema left axilla.   Neurological: He is alert and oriented to person, place, and time. No cranial nerve deficit.  Skin: Skin is warm and dry.  Psychiatric: He has a normal mood and affect. His behavior is normal.    ED Course  Procedures ( INCISION AND DRAINAGE Performed by: NEESE,HOPE Consent: Verbal consent obtained. Risks and benefits: risks, benefits and alternatives were discussed Type: abscess  Body area: left upper arm palmar aspect   Cleaned with betadine  Anesthesia: local infiltration  Local anesthetic: lidocaine 2% with epinephrine  Anesthetic total: 3 ml  Incision made with # 11 blade, straight  Complexity: complex Blunt dissection to break up loculations  Drainage: purulent  Drainage amount: large  Packing material: 1/4 in iodoform gauze  Patient tolerance: Patient  tolerated the procedure well with no immediate complications.    MDM  33 y.o. male with 5 cm abscess to the left upper arm. Will start antibiotics and pain medication and he will return in 2 days ago recheck. He will take Septra DS 2 tablets BID x 2 days and then one tablet BID. Discussed with the patient and all questioned fully answered.    Medication List    TAKE these medications       HYDROcodone-acetaminophen 5-325 MG per tablet  Commonly known as:  NORCO/VICODIN  Take 2 tablets by mouth every 4 (four) hours as needed.     sulfamethoxazole-trimethoprim 800-160 MG per tablet  Commonly known as:  BACTRIM DS,SEPTRA DS  Take 2 tablets PO BID x 2 days and then one tablet BID until finished      ASK your doctor about these medications       dolutegravir 50 MG tablet  Commonly known as:  TIVICAY  Take 1 tablet (50 mg total) by mouth daily.     emtricitabine-tenofovir 200-300 MG per tablet  Commonly known as:  TRUVADA  Take 1 tablet by mouth daily.     loratadine 10 MG tablet  Commonly known as:  CLARITIN  TAKE 1 TABLET BY MOUTH DAILY     LORazepam 0.5 MG tablet  Commonly known as:  ATIVAN  Take 1 tablet (0.5 mg total) by mouth 2 (two) times daily as needed for anxiety. Take 1/2 every 8 hours as needed     ondansetron 4 MG tablet  Commonly known as:  ZOFRAN  Take 1 tablet (4 mg total) by mouth every 8 (eight) hours as needed for nausea.     traMADol 50 MG tablet  Commonly known as:  ULTRAM  Take 1 tablet (50 mg total) by mouth every 8 (eight) hours as needed for pain.           [facility] Orlene Och, NP 05/15/13 [address] Orlene Och, NP 05/24/13 [phone]

## 2013-05-15 NOTE — Discharge Instructions (Signed)
Return in 2 days for recheck and packing removal. Return sooner for problems. Take the medication as directed.

## 2013-05-15 NOTE — ED Notes (Signed)
Has a boil under his left arm, has had them in the past. Noticed 3-4 days ago

## 2013-05-15 NOTE — ED Notes (Signed)
I & D tray is at the bedside set up and ready for the doctor to use. 

## 2013-05-17 ENCOUNTER — Telehealth: Payer: Self-pay | Admitting: *Deleted

## 2013-05-17 NOTE — Telephone Encounter (Signed)
[facility].  Yes, OK to take with HIV rxes.  RN shared that this medication is sometimes prescribed with HIV rxes by [facility] MDs.

## 2013-05-17 NOTE — ED Provider Notes (Signed)
Medical screening examination/treatment/procedure(s) were performed by non-physician practitioner and as supervising physician I was immediately available for consultation/collaboration.   EKG Interpretation None        Gilda Creasehristopher J. Pollina, MD 05/17/13 [id number]

## 2013-05-18 ENCOUNTER — Encounter (HOSPITAL_BASED_OUTPATIENT_CLINIC_OR_DEPARTMENT_OTHER): Payer: Self-pay | Admitting: Emergency Medicine

## 2013-05-18 ENCOUNTER — Emergency Department (HOSPITAL_BASED_OUTPATIENT_CLINIC_OR_DEPARTMENT_OTHER)
Admission: EM | Admit: 2013-05-18 | Discharge: 2013-05-18 | Disposition: A | Discharge status: Home / Self Care | Payer: Self-pay | Attending: Emergency Medicine | Admitting: Emergency Medicine

## 2013-05-18 DIAGNOSIS — Z4801 Encounter for change or removal of surgical wound dressing: Secondary | ICD-10-CM | POA: Insufficient documentation

## 2013-05-18 DIAGNOSIS — F411 Generalized anxiety disorder: Secondary | ICD-10-CM | POA: Insufficient documentation

## 2013-05-18 DIAGNOSIS — Z86718 Personal history of other venous thrombosis and embolism: Secondary | ICD-10-CM | POA: Insufficient documentation

## 2013-05-18 DIAGNOSIS — Z79899 Other long term (current) drug therapy: Secondary | ICD-10-CM | POA: Insufficient documentation

## 2013-05-18 DIAGNOSIS — F3289 Other specified depressive episodes: Secondary | ICD-10-CM | POA: Insufficient documentation

## 2013-05-18 DIAGNOSIS — Z09 Encounter for follow-up examination after completed treatment for conditions other than malignant neoplasm: Secondary | ICD-10-CM

## 2013-05-18 DIAGNOSIS — Z8701 Personal history of pneumonia (recurrent): Secondary | ICD-10-CM | POA: Insufficient documentation

## 2013-05-18 DIAGNOSIS — Z792 Long term (current) use of antibiotics: Secondary | ICD-10-CM | POA: Insufficient documentation

## 2013-05-18 DIAGNOSIS — F329 Major depressive disorder, single episode, unspecified: Secondary | ICD-10-CM | POA: Insufficient documentation

## 2013-05-18 DIAGNOSIS — Z862 Personal history of diseases of the blood and blood-forming organs and certain disorders involving the immune mechanism: Secondary | ICD-10-CM | POA: Insufficient documentation

## 2013-05-18 DIAGNOSIS — Z21 Asymptomatic human immunodeficiency virus [HIV] infection status: Secondary | ICD-10-CM | POA: Insufficient documentation

## 2013-05-18 NOTE — ED Provider Notes (Signed)
Medical screening examination/treatment/procedure(s) were performed by non-physician practitioner and as supervising physician I was immediately available for consultation/collaboration.   EKG Interpretation None        Estel Scholze, MD 05/18/13 1534 

## 2013-05-18 NOTE — ED Provider Notes (Signed)
CSN: [id number]     Arrival date & time 05/18/13  1444 History   First MD Initiated Contact with Patient 05/18/13 1450     Chief Complaint  Patient presents with  . Wound Check     (Consider location/radiation/quality/duration/timing/severity/associated sxs/prior Treatment) HPI Comments: Pt states that he had abscess I&D to the left axilla. Pt states that the area feels a lot better and he has not had any problem  The history is provided by the patient. No language interpreter was used.    Past Medical History  Diagnosis Date  . HIV infection   . Deep venous thrombosis   . Depression   . Anemia   . Anxiety   . PCP (pneumocystis jiroveci pneumonia)    History reviewed. No pertinent past surgical history. No family history on file. History  Substance Use Topics  . Smoking status: Never Smoker   . Smokeless tobacco: Never Used  . Alcohol Use: 0.5 oz/week    1 drink(s) per week     Comment: wine    Review of Systems  Constitutional: Negative.   Respiratory: Negative.       Allergies  Zithromax  Home Medications   Current Outpatient Rx  Name  Route  Sig  Dispense  Refill  . dolutegravir (TIVICAY) 50 MG tablet   Oral   Take 1 tablet (50 mg total) by mouth daily.   30 tablet   11   . emtricitabine-tenofovir (TRUVADA) 200-300 MG per tablet   Oral   Take 1 tablet by mouth daily.   30 tablet   11   . HYDROcodone-acetaminophen (NORCO/VICODIN) 5-325 MG per tablet   Oral   Take 2 tablets by mouth every 4 (four) hours as needed.   10 tablet   0   . loratadine (CLARITIN) 10 MG tablet      TAKE 1 TABLET BY MOUTH DAILY   30 tablet   0   . LORazepam (ATIVAN) 0.5 MG tablet   Oral   Take 1 tablet (0.5 mg total) by mouth 2 (two) times daily as needed for anxiety. Take 1/2 every 8 hours as needed   30 tablet   4   . ondansetron (ZOFRAN) 4 MG tablet   Oral   Take 1 tablet (4 mg total) by mouth every 8 (eight) hours as needed for nausea.   60 tablet   2   .  sulfamethoxazole-trimethoprim (BACTRIM DS,SEPTRA DS) 800-160 MG per tablet      Take 2 tablets PO BID x 2 days and then one tablet BID until finished   20 tablet   0   . traMADol (ULTRAM) 50 MG tablet   Oral   Take 1 tablet (50 mg total) by mouth every 8 (eight) hours as needed for pain.   60 tablet   1    BP 129/76  Pulse 61  Temp(Src) 98.5 F (36.9 C) (Oral)  Resp 18  Ht 5\' 9"  (1.753 m)  Wt 180 lb (81.647 kg)  BMI 26.57 kg/m2  SpO2 100% Physical Exam  Nursing note and vitals reviewed. Constitutional: He is oriented to person, place, and time. He appears well-developed and well-nourished.  Cardiovascular: Normal rate and regular rhythm.   Pulmonary/Chest: Effort normal and breath sounds normal.  Musculoskeletal: Normal range of motion.  Neurological: He is alert and oriented to person, place, and time.  Skin:  Well healing wound to the left axilla. Packing removed and pt redressed    ED Course  Procedures (  including critical care time) Labs Review Labs Reviewed - No data to display Imaging Review No results found.   EKG Interpretation None      MDM   Final diagnoses:  Encounter for recheck of abscess following incision and drainage    Pt to continue antibiotics. Wound redressed. Packing removed    Teressa LowerVrinda Isys Tietje, NP 05/18/13 1458  Teressa LowerVrinda Estelle Skibicki, NP 05/18/13 1500

## 2013-05-18 NOTE — ED Notes (Signed)
Pt with I&D of abscess to left axilla recently. Here for recheck.  Denies complications.

## 2013-05-24 ENCOUNTER — Other Ambulatory Visit: Payer: Self-pay | Admitting: *Deleted

## 2013-05-24 DIAGNOSIS — Z21 Asymptomatic human immunodeficiency virus [HIV] infection status: Secondary | ICD-10-CM

## 2013-05-24 DIAGNOSIS — B20 Human immunodeficiency virus [HIV] disease: Secondary | ICD-10-CM

## 2013-05-24 MED ORDER — DOLUTEGRAVIR SODIUM 50 MG PO TABS: 50.0000 mg | ORAL_TABLET | Freq: Every day | ORAL | Status: DC

## 2013-05-24 MED ORDER — EMTRICITABINE-TENOFOVIR DF 200-300 MG PO TABS: 1.0000 | ORAL_TABLET | Freq: Every day | ORAL | Status: DC

## 2013-05-24 NOTE — Telephone Encounter (Signed)
ADAP application 

## 2013-05-25 NOTE — ED Provider Notes (Signed)
Medical screening examination/treatment/procedure(s) were performed by non-physician practitioner and as supervising physician I was immediately available for consultation/collaboration.   EKG Interpretation None        Christopher J. Pollina, MD 05/25/13 2116 

## 2013-06-16 ENCOUNTER — Other Ambulatory Visit (INDEPENDENT_AMBULATORY_CARE_PROVIDER_SITE_OTHER): Payer: Self-pay

## 2013-06-16 DIAGNOSIS — Z79899 Other long term (current) drug therapy: Secondary | ICD-10-CM

## 2013-06-16 DIAGNOSIS — B20 Human immunodeficiency virus [HIV] disease: Secondary | ICD-10-CM

## 2013-06-16 LAB — COMPLETE METABOLIC PANEL WITH GFR
ALK PHOS: 81 U/L (ref 39–117)
ALT: 15 U/L (ref 0–53)
AST: 23 U/L (ref 0–37)
Albumin: 4.2 g/dL (ref 3.5–5.2)
BUN: 10 mg/dL (ref 6–23)
CO2: 25 mEq/L (ref 19–32)
Calcium: 8.7 mg/dL (ref 8.4–10.5)
Chloride: 105 mEq/L (ref 96–112)
Creat: 1.02 mg/dL (ref 0.50–1.35)
GFR, Est Non African American: >89 mL/min
GLUCOSE: 74 mg/dL (ref 70–99)
Potassium: 3.9 mEq/L (ref 3.5–5.3)
Sodium: 138 mEq/L (ref 135–145)
Total Bilirubin: 0.4 mg/dL (ref 0.2–1.2)
Total Protein: 6.8 g/dL (ref 6.0–8.3)

## 2013-06-16 LAB — CBC WITH DIFFERENTIAL/PLATELET
BASOS PCT: 1 % (ref 0–1)
Basophils Absolute: 0 10*3/uL (ref 0.0–0.1)
EOS PCT: 6 % — AB (ref 0–5)
Eosinophils Absolute: 0.2 10*3/uL (ref 0.0–0.7)
HCT: 38.5 % — ABNORMAL LOW (ref 39.0–52.0)
HEMOGLOBIN: 13.2 g/dL (ref 13.0–17.0)
Lymphocytes Relative: 43 % (ref 12–46)
Lymphs Abs: 1.6 10*3/uL (ref 0.7–4.0)
MCH: 29.2 pg (ref 26.0–34.0)
MCHC: 34.3 g/dL (ref 30.0–36.0)
MCV: 85.2 fL (ref 78.0–100.0)
MONO ABS: 0.4 10*3/uL (ref 0.1–1.0)
Monocytes Relative: 11 % (ref 3–12)
Neutro Abs: 1.5 10*3/uL — ABNORMAL LOW (ref 1.7–7.7)
Neutrophils Relative %: 39 % — ABNORMAL LOW (ref 43–77)
Platelets: 219 10*3/uL (ref 150–400)
RBC: 4.52 MIL/uL (ref 4.22–5.81)
RDW: 14.1 % (ref 11.5–15.5)
WBC: 3.8 10*3/uL — ABNORMAL LOW (ref 4.0–10.5)

## 2013-06-16 LAB — LIPID PANEL
CHOL/HDL RATIO: 2.6 ratio
CHOLESTEROL: 124 mg/dL (ref 0–200)
HDL: 48 mg/dL (ref >39)
LDL Cholesterol: 67 mg/dL (ref 0–99)
Triglycerides: 46 mg/dL (ref <150)
VLDL: 9 mg/dL (ref 0–40)

## 2013-06-17 LAB — T-HELPER CELL (CD4) - (RCID CLINIC ONLY)
CD4 T CELL HELPER: 18 % — AB (ref 33–55)
CD4 T Cell Abs: 310 /uL — ABNORMAL LOW (ref 400–2700)

## 2013-06-17 LAB — HIV-1 RNA QUANT-NO REFLEX-BLD: HIV-1 RNA Quant, Log: <1.3 {Log} (ref <1.30)

## 2013-06-30 ENCOUNTER — Ambulatory Visit: Payer: Self-pay | Admitting: Infectious Disease

## 2013-07-21 ENCOUNTER — Ambulatory Visit: Payer: Self-pay | Admitting: Infectious Disease

## 2013-11-03 ENCOUNTER — Other Ambulatory Visit: Payer: Self-pay | Admitting: Infectious Disease

## 2013-11-03 ENCOUNTER — Ambulatory Visit: Payer: Self-pay

## 2013-11-03 DIAGNOSIS — B20 Human immunodeficiency virus [HIV] disease: Secondary | ICD-10-CM

## 2013-11-08 ENCOUNTER — Other Ambulatory Visit: Payer: Self-pay | Admitting: *Deleted

## 2013-11-08 DIAGNOSIS — B20 Human immunodeficiency virus [HIV] disease: Secondary | ICD-10-CM

## 2013-11-08 MED ORDER — DOLUTEGRAVIR SODIUM 50 MG PO TABS: 50.0000 mg | ORAL_TABLET | Freq: Every day | ORAL | Status: DC

## 2013-11-08 MED ORDER — EMTRICITABINE-TENOFOVIR DF 200-300 MG PO TABS: 1.0000 | ORAL_TABLET | Freq: Every day | ORAL | Status: DC

## 2013-11-08 NOTE — Telephone Encounter (Signed)
ADAP Application 

## 2013-11-09 ENCOUNTER — Other Ambulatory Visit (INDEPENDENT_AMBULATORY_CARE_PROVIDER_SITE_OTHER): Payer: Self-pay

## 2013-11-09 DIAGNOSIS — Z21 Asymptomatic human immunodeficiency virus [HIV] infection status: Secondary | ICD-10-CM

## 2013-11-09 DIAGNOSIS — Z113 Encounter for screening for infections with a predominantly sexual mode of transmission: Secondary | ICD-10-CM

## 2013-11-09 DIAGNOSIS — B20 Human immunodeficiency virus [HIV] disease: Secondary | ICD-10-CM

## 2013-11-09 LAB — CBC WITH DIFFERENTIAL/PLATELET
BASOS ABS: 0 10*3/uL (ref 0.0–0.1)
BASOS PCT: 1 % (ref 0–1)
Eosinophils Absolute: 0.2 10*3/uL (ref 0.0–0.7)
Eosinophils Relative: 4 % (ref 0–5)
HEMATOCRIT: 42.1 % (ref 39.0–52.0)
Hemoglobin: 14.4 g/dL (ref 13.0–17.0)
LYMPHS PCT: 40 % (ref 12–46)
Lymphs Abs: 1.8 10*3/uL (ref 0.7–4.0)
MCH: 28.9 pg (ref 26.0–34.0)
MCHC: 34.2 g/dL (ref 30.0–36.0)
MCV: 84.4 fL (ref 78.0–100.0)
MONO ABS: 0.5 10*3/uL (ref 0.1–1.0)
Monocytes Relative: 12 % (ref 3–12)
NEUTROS ABS: 1.9 10*3/uL (ref 1.7–7.7)
NEUTROS PCT: 43 % (ref 43–77)
Platelets: 239 10*3/uL (ref 150–400)
RBC: 4.99 MIL/uL (ref 4.22–5.81)
RDW: 14.2 % (ref 11.5–15.5)
WBC: 4.5 10*3/uL (ref 4.0–10.5)

## 2013-11-09 LAB — COMPREHENSIVE METABOLIC PANEL
ALBUMIN: 4.6 g/dL (ref 3.5–5.2)
ALT: 22 U/L (ref 0–53)
AST: 26 U/L (ref 0–37)
Alkaline Phosphatase: 83 U/L (ref 39–117)
BUN: 13 mg/dL (ref 6–23)
CALCIUM: 9.5 mg/dL (ref 8.4–10.5)
CHLORIDE: 105 meq/L (ref 96–112)
CO2: 26 meq/L (ref 19–32)
CREATININE: 1.42 mg/dL — AB (ref 0.50–1.35)
Glucose, Bld: 97 mg/dL (ref 70–99)
Potassium: 4.5 mEq/L (ref 3.5–5.3)
Sodium: 138 mEq/L (ref 135–145)
Total Bilirubin: 0.3 mg/dL (ref 0.2–1.2)
Total Protein: 7.2 g/dL (ref 6.0–8.3)

## 2013-11-09 LAB — RPR

## 2013-11-10 LAB — T-HELPER CELL (CD4) - (RCID CLINIC ONLY)
CD4 % Helper T Cell: 13 % — ABNORMAL LOW (ref 33–55)
CD4 T Cell Abs: 240 /uL — ABNORMAL LOW (ref 400–2700)

## 2013-11-10 LAB — HIV-1 RNA QUANT-NO REFLEX-BLD: HIV-1 RNA Quant, Log: <1.3 {Log} (ref <1.30)

## 2013-11-24 ENCOUNTER — Encounter: Payer: Self-pay | Admitting: Infectious Disease

## 2013-11-24 ENCOUNTER — Ambulatory Visit (INDEPENDENT_AMBULATORY_CARE_PROVIDER_SITE_OTHER): Payer: Self-pay | Admitting: Infectious Disease

## 2013-11-24 VITALS — BP 126/79 | HR 65 | Temp 97.8°F | Wt 205.0 lb

## 2013-11-24 DIAGNOSIS — B20 Human immunodeficiency virus [HIV] disease: Secondary | ICD-10-CM

## 2013-11-24 DIAGNOSIS — Z21 Asymptomatic human immunodeficiency virus [HIV] infection status: Secondary | ICD-10-CM

## 2013-11-24 DIAGNOSIS — N289 Disorder of kidney and ureter, unspecified: Secondary | ICD-10-CM

## 2013-11-24 MED ORDER — ABACAVIR-DOLUTEGRAVIR-LAMIVUD 600-50-300 MG PO TABS: 1.0000 | ORAL_TABLET | Freq: Every day | ORAL | Status: DC

## 2013-11-24 NOTE — Progress Notes (Signed)
Subjective:    Patient ID: Lance Craig, male    DOB: [birth year], 33 y.o.   MRN: [id number]  HPI   Lance Craig is a 33 y.o. 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 It reduces NVP and EFV susceptibility by 2 to 5-fold  Therefore I felt strong need to change him to THREE FULLY active drugs by changed to [facility] since he prefers Qdaily drug however he did not tolerate the STRIBILD  well do to severe nausea and he has been changed over to [facility] and Truvada which he has tolerated fine I tried to change him over to [facility] and felt safe about this change and the fact that he does not have significant risk factors for coronary disease and he is HLA [id number] negative. However after leaving his last visit he felt anxious when reading the product description regarding side effects of abacavir and he decided to status current regimen.  Lab Results  Component Value Date   HIV1RNAQUANT <20 11/09/2013   Lab Results  Component Value Date   CD4TABS 240* 11/09/2013   CD4TABS 310* 06/16/2013   CD4TABS 290* 02/01/2013          Review of Systems  Constitutional: Negative for fever, chills, diaphoresis, activity change, appetite change, fatigue and unexpected weight change.  HENT: Negative for congestion, rhinorrhea, sinus pressure, sneezing, sore throat and trouble swallowing.   Eyes: Negative for photophobia and visual disturbance.  Respiratory: Negative for cough, chest tightness, shortness of breath, wheezing and stridor.    Cardiovascular: Negative for chest pain, palpitations and leg swelling.  Gastrointestinal: Negative for nausea, vomiting, abdominal pain, diarrhea, constipation, blood in stool, abdominal distention and anal bleeding.  Genitourinary: Negative for dysuria, hematuria, flank pain and difficulty urinating.  Musculoskeletal: Positive for back pain. Negative for arthralgias, gait problem, joint swelling and myalgias.  Skin: Negative for color change, pallor, rash and wound.  Neurological: Negative for dizziness, tremors, weakness and light-headedness.  Hematological: Negative for adenopathy. Does not bruise/bleed easily.  Psychiatric/Behavioral: Negative for behavioral problems, confusion, sleep disturbance, dysphoric mood, decreased concentration and agitation.       Objective:   Physical Exam  Constitutional: He is oriented to person, place, and time. He appears well-developed and well-nourished. No distress.  HENT:  Head: Normocephalic and atraumatic.  Mouth/Throat: Oropharynx is clear and moist. No oropharyngeal exudate.  Eyes: Conjunctivae and EOM are normal. Pupils are equal, round, and reactive to light. No scleral icterus.  Neck: Normal range of motion. Neck supple. No JVD present.  Cardiovascular: Normal rate, regular rhythm and normal heart sounds.  Exam reveals no gallop and no friction rub.   No murmur heard. Pulmonary/Chest: Effort normal and breath sounds normal. No respiratory distress. He has no wheezes. He has no rales. He exhibits no tenderness.  Abdominal: He exhibits no distension and no mass. There is tenderness. There is no rebound and no guarding.  Musculoskeletal: He exhibits no edema and no tenderness.  Lymphadenopathy:    He has no cervical adenopathy.  Neurological: He is alert  and oriented to person, place, and time. He has normal reflexes. He exhibits normal muscle tone. Coordination normal.  Skin: Skin is warm and dry. He is not diaphoretic. No erythema. No  pallor.  Psychiatric: He has a normal mood and affect. His behavior is normal. Judgment and thought content normal.          Assessment & Plan:   #1 HIV: we changed him to [facility] today and will recheck labs in 2 months time. I spent greater than 25 minutes with the patient including greater than 50% of time in face to face counsel of the patient and in coordination of their care.   #2 Acute renal insufficiency: will recheck labs today and UA, microalmbumin to creatinine ratio

## 2013-11-25 LAB — BASIC METABOLIC PANEL WITH GFR
BUN: 13 mg/dL (ref 6–23)
CALCIUM: 9.4 mg/dL (ref 8.4–10.5)
CO2: 24 meq/L (ref 19–32)
CREATININE: 1.1 mg/dL (ref 0.50–1.35)
Chloride: 107 mEq/L (ref 96–112)
GFR, Est Non African American: 88 mL/min
Glucose, Bld: 74 mg/dL (ref 70–99)
Potassium: 4.2 mEq/L (ref 3.5–5.3)
Sodium: 138 mEq/L (ref 135–145)

## 2013-11-25 LAB — URINALYSIS, ROUTINE W REFLEX MICROSCOPIC
Bilirubin Urine: NEGATIVE
Glucose, UA: NEGATIVE mg/dL
Hgb urine dipstick: NEGATIVE
KETONES UR: NEGATIVE mg/dL
Leukocytes, UA: NEGATIVE
Nitrite: NEGATIVE
PROTEIN: NEGATIVE mg/dL
Specific Gravity, Urine: 1.026 (ref 1.005–1.030)
UROBILINOGEN UA: 0.2 mg/dL (ref 0.0–1.0)
pH: 5.5 (ref 5.0–8.0)

## 2013-11-25 LAB — PHOSPHORUS: Phosphorus: 3.5 mg/dL (ref 2.3–4.6)

## 2013-12-23 ENCOUNTER — Other Ambulatory Visit: Payer: Self-pay

## 2013-12-29 ENCOUNTER — Other Ambulatory Visit (INDEPENDENT_AMBULATORY_CARE_PROVIDER_SITE_OTHER): Payer: Self-pay

## 2013-12-29 DIAGNOSIS — Z113 Encounter for screening for infections with a predominantly sexual mode of transmission: Secondary | ICD-10-CM

## 2013-12-29 DIAGNOSIS — B20 Human immunodeficiency virus [HIV] disease: Secondary | ICD-10-CM

## 2013-12-29 LAB — CBC WITH DIFFERENTIAL/PLATELET
BASOS PCT: 1 % (ref 0–1)
Basophils Absolute: 0 10*3/uL (ref 0.0–0.1)
EOS PCT: 4 % (ref 0–5)
Eosinophils Absolute: 0.2 10*3/uL (ref 0.0–0.7)
HEMATOCRIT: 37.7 % — AB (ref 39.0–52.0)
Hemoglobin: 13.2 g/dL (ref 13.0–17.0)
Lymphocytes Relative: 45 % (ref 12–46)
Lymphs Abs: 1.7 10*3/uL (ref 0.7–4.0)
MCH: 29.9 pg (ref 26.0–34.0)
MCHC: 35 g/dL (ref 30.0–36.0)
MCV: 85.3 fL (ref 78.0–100.0)
MONO ABS: 0.4 10*3/uL (ref 0.1–1.0)
Monocytes Relative: 10 % (ref 3–12)
Neutro Abs: 1.5 10*3/uL — ABNORMAL LOW (ref 1.7–7.7)
Neutrophils Relative %: 40 % — ABNORMAL LOW (ref 43–77)
Platelets: 260 10*3/uL (ref 150–400)
RBC: 4.42 MIL/uL (ref 4.22–5.81)
RDW: 13.9 % (ref 11.5–15.5)
WBC: 3.8 10*3/uL — ABNORMAL LOW (ref 4.0–10.5)

## 2013-12-29 LAB — COMPLETE METABOLIC PANEL WITH GFR
ALBUMIN: 4.2 g/dL (ref 3.5–5.2)
ALT: 18 U/L (ref 0–53)
AST: 24 U/L (ref 0–37)
Alkaline Phosphatase: 72 U/L (ref 39–117)
BUN: 12 mg/dL (ref 6–23)
CALCIUM: 9.4 mg/dL (ref 8.4–10.5)
CHLORIDE: 106 meq/L (ref 96–112)
CO2: 24 mEq/L (ref 19–32)
Creat: 1.18 mg/dL (ref 0.50–1.35)
GFR, EST NON AFRICAN AMERICAN: 81 mL/min
GFR, Est African American: >89 mL/min
GLUCOSE: 90 mg/dL (ref 70–99)
POTASSIUM: 3.9 meq/L (ref 3.5–5.3)
SODIUM: 139 meq/L (ref 135–145)
Total Bilirubin: 0.3 mg/dL (ref 0.2–1.2)
Total Protein: 6.9 g/dL (ref 6.0–8.3)

## 2013-12-30 LAB — RPR

## 2013-12-30 LAB — T-HELPER CELL (CD4) - (RCID CLINIC ONLY)
CD4 % Helper T Cell: 20 % — ABNORMAL LOW (ref 33–55)
CD4 T Cell Abs: 320 /uL — ABNORMAL LOW (ref 400–2700)

## 2013-12-30 LAB — HIV-1 RNA QUANT-NO REFLEX-BLD
HIV 1 RNA Quant: <20 copies/mL (ref <20)
HIV-1 RNA Quant, Log: <1.3 {Log} (ref <1.30)

## 2014-01-05 ENCOUNTER — Encounter: Payer: Self-pay | Admitting: Infectious Disease

## 2014-01-05 ENCOUNTER — Ambulatory Visit (INDEPENDENT_AMBULATORY_CARE_PROVIDER_SITE_OTHER): Payer: Self-pay | Admitting: Infectious Disease

## 2014-01-05 VITALS — BP 130/81 | HR 57 | Temp 97.9°F | Wt 206.0 lb

## 2014-01-05 DIAGNOSIS — N179 Acute kidney failure, unspecified: Secondary | ICD-10-CM

## 2014-01-05 DIAGNOSIS — B20 Human immunodeficiency virus [HIV] disease: Secondary | ICD-10-CM

## 2014-01-05 DIAGNOSIS — Z21 Asymptomatic human immunodeficiency virus [HIV] infection status: Secondary | ICD-10-CM

## 2014-01-05 NOTE — Progress Notes (Signed)
  Subjective:    Patient ID: Lance BrilliantHaywood J Mehlhaff, male    DOB: [birth year], 33 y.o.   MRN: [id number]  HPI   Lance Craig is a 33 y.o. 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 It reduces NVP and EFV susceptibility by 2 to 5-fold  Therefore I felt strong need to change him to THREE FULLY active drugs by [facility] drug however he did not tolerate the STRIBILD  well do to severe nausea and he has been [facility] Truvada which he has tolerated fine and now has been [facility] now with undetectable viral load and healthy CD4 count.  Lab Results  Component Value Date   HIV1RNAQUANT <20 12/29/2013   Lab Results  Component Value Date   CD4TABS 320* 12/29/2013   CD4TABS 240* 11/09/2013   CD4TABS 310* 06/16/2013          Review of Systems  Constitutional: Negative for fever, chills, diaphoresis, activity change, appetite change, fatigue and unexpected weight change.  HENT: Negative for congestion, rhinorrhea, sinus pressure, sneezing, sore throat and trouble swallowing.   Eyes: Negative for photophobia and visual disturbance.  Respiratory: Negative for cough, chest tightness, shortness of breath, wheezing and stridor.   Cardiovascular: Negative for chest pain, palpitations and leg swelling.  Gastrointestinal: Negative for nausea, vomiting, abdominal pain, diarrhea, constipation, blood in stool, abdominal distention and anal bleeding.  Genitourinary: Negative for dysuria,  hematuria, flank pain and difficulty urinating.  Musculoskeletal: Negative for arthralgias, gait problem, joint swelling and myalgias.  Skin: Negative for color change, pallor, rash and wound.  Neurological: Negative for dizziness, tremors, weakness and light-headedness.  Hematological: Negative for adenopathy. Does not bruise/bleed easily.  Psychiatric/Behavioral: Negative for behavioral problems, confusion, sleep disturbance, dysphoric mood, decreased concentration and agitation.       Objective:   Physical Exam  Constitutional: He is oriented to person, place, and time. He appears well-developed and well-nourished. No distress.  HENT:  Head: Normocephalic and atraumatic.  Mouth/Throat: Oropharynx is clear and moist. No oropharyngeal exudate.  Eyes: Conjunctivae and EOM are normal. Pupils are equal, round, and reactive to light. No scleral icterus.  Neck: Normal range of motion. Neck supple. No JVD present.  Cardiovascular: Normal rate and regular rhythm.   Pulmonary/Chest: Effort normal. No respiratory distress. He has no wheezes.  Abdominal: He exhibits no distension.  Musculoskeletal: He exhibits no edema and no tenderness.  Lymphadenopathy:    He has no cervical adenopathy.  Neurological: He is alert and oriented to person, place, and time. He has normal reflexes. He exhibits normal muscle tone. Coordination normal.  Skin: Skin is warm and dry. He is not diaphoretic. No erythema. No pallor.  Psychiatric: He has a normal mood and affect. His behavior is normal. Judgment and thought content normal.          Assessment & Plan:   #1 HIV: Continue TRIUMEQ today a     #2 Acute renal insufficiency: resolved

## 2014-02-09 ENCOUNTER — Other Ambulatory Visit: Payer: Self-pay | Admitting: *Deleted

## 2014-02-09 DIAGNOSIS — B20 Human immunodeficiency virus [HIV] disease: Secondary | ICD-10-CM

## 2014-02-09 MED ORDER — ABACAVIR-DOLUTEGRAVIR-LAMIVUD 600-50-300 MG PO TABS: 1.0000 | ORAL_TABLET | Freq: Every day | ORAL | Status: DC

## 2014-03-28 ENCOUNTER — Other Ambulatory Visit (INDEPENDENT_AMBULATORY_CARE_PROVIDER_SITE_OTHER): Payer: Self-pay

## 2014-03-28 DIAGNOSIS — Z113 Encounter for screening for infections with a predominantly sexual mode of transmission: Secondary | ICD-10-CM

## 2014-03-28 DIAGNOSIS — Z21 Asymptomatic human immunodeficiency virus [HIV] infection status: Secondary | ICD-10-CM

## 2014-03-28 DIAGNOSIS — B20 Human immunodeficiency virus [HIV] disease: Secondary | ICD-10-CM

## 2014-03-28 LAB — CBC WITH DIFFERENTIAL/PLATELET
Basophils Absolute: 0 10*3/uL (ref 0.0–0.1)
Basophils Relative: 1 % (ref 0–1)
Eosinophils Absolute: 0.1 10*3/uL (ref 0.0–0.7)
Eosinophils Relative: 3 % (ref 0–5)
HCT: 40.8 % (ref 39.0–52.0)
Hemoglobin: 13.6 g/dL (ref 13.0–17.0)
LYMPHS ABS: 1.6 10*3/uL (ref 0.7–4.0)
LYMPHS PCT: 39 % (ref 12–46)
MCH: 29.2 pg (ref 26.0–34.0)
MCHC: 33.3 g/dL (ref 30.0–36.0)
MCV: 87.7 fL (ref 78.0–100.0)
MONOS PCT: 10 % (ref 3–12)
MPV: 8.9 fL (ref 8.6–12.4)
Monocytes Absolute: 0.4 10*3/uL (ref 0.1–1.0)
NEUTROS PCT: 47 % (ref 43–77)
Neutro Abs: 1.9 10*3/uL (ref 1.7–7.7)
Platelets: 250 10*3/uL (ref 150–400)
RBC: 4.65 MIL/uL (ref 4.22–5.81)
RDW: 14.7 % (ref 11.5–15.5)
WBC: 4 10*3/uL (ref 4.0–10.5)

## 2014-03-28 LAB — COMPLETE METABOLIC PANEL WITH GFR
ALBUMIN: 4.4 g/dL (ref 3.5–5.2)
ALT: 15 U/L (ref 0–53)
AST: 21 U/L (ref 0–37)
Alkaline Phosphatase: 71 U/L (ref 39–117)
BILIRUBIN TOTAL: 0.3 mg/dL (ref 0.2–1.2)
BUN: 11 mg/dL (ref 6–23)
CO2: 26 mEq/L (ref 19–32)
CREATININE: 1.12 mg/dL (ref 0.50–1.35)
Calcium: 9.1 mg/dL (ref 8.4–10.5)
Chloride: 105 mEq/L (ref 96–112)
GFR, EST NON AFRICAN AMERICAN: 86 mL/min
GFR, Est African American: >89 mL/min
Glucose, Bld: 78 mg/dL (ref 70–99)
Potassium: 4 mEq/L (ref 3.5–5.3)
Sodium: 141 mEq/L (ref 135–145)
Total Protein: 6.9 g/dL (ref 6.0–8.3)

## 2014-03-29 LAB — HIV-1 RNA QUANT-NO REFLEX-BLD: HIV-1 RNA Quant, Log: <1.3 {Log} (ref <1.30)

## 2014-03-29 LAB — MICROALBUMIN / CREATININE URINE RATIO
Creatinine, Urine: 437.8 mg/dL
Microalb Creat Ratio: 1.8 mg/g (ref 0.0–30.0)
Microalb, Ur: 0.8 mg/dL (ref <2.0)

## 2014-03-29 LAB — URINE CYTOLOGY ANCILLARY ONLY
CHLAMYDIA, DNA PROBE: NEGATIVE
NEISSERIA GONORRHEA: NEGATIVE

## 2014-03-29 LAB — T-HELPER CELL (CD4) - (RCID CLINIC ONLY)
CD4 % Helper T Cell: 18 % — ABNORMAL LOW (ref 33–55)
CD4 T CELL ABS: 300 /uL — AB (ref 400–2700)

## 2014-04-11 ENCOUNTER — Encounter: Payer: Self-pay | Admitting: Infectious Disease

## 2014-04-11 ENCOUNTER — Ambulatory Visit (INDEPENDENT_AMBULATORY_CARE_PROVIDER_SITE_OTHER): Payer: Self-pay | Admitting: Infectious Disease

## 2014-04-11 DIAGNOSIS — Z21 Asymptomatic human immunodeficiency virus [HIV] infection status: Secondary | ICD-10-CM

## 2014-04-11 DIAGNOSIS — B20 Human immunodeficiency virus [HIV] disease: Secondary | ICD-10-CM

## 2014-04-11 NOTE — Progress Notes (Signed)
  Subjective:    Patient ID: Lance Craig, male    DOB: [birth year], 34 y.o.   MRN: [id number]  HPI   Lance BrilliantHaywood J Raatz is a 34 y.o. male with history of HIV/AIDS with PCP pneuonia in the past who had been doing superbly well on his  antiviral regimen, TRIUMEQ with undetectable viral load CD4 count now above 200    Lab Results  Component Value Date   HIV1RNAQUANT <20 03/28/2014   Lab Results  Component Value Date   CD4TABS 300* 03/28/2014   CD4TABS 320* 12/29/2013   CD4TABS 240* 11/09/2013          Review of Systems  Constitutional: Negative for fever, chills, diaphoresis, activity change, appetite change, fatigue and unexpected weight change.  HENT: Negative for congestion, rhinorrhea, sinus pressure, sneezing, sore throat and trouble swallowing.   Eyes: Negative for photophobia and visual disturbance.  Respiratory: Negative for cough, chest tightness, shortness of breath, wheezing and stridor.   Cardiovascular: Negative for chest pain, palpitations and leg swelling.  Gastrointestinal: Negative for nausea, vomiting, abdominal pain, diarrhea, constipation, blood in stool, abdominal distention and anal bleeding.  Genitourinary: Negative for dysuria, hematuria, flank pain and difficulty urinating.  Musculoskeletal: Negative for myalgias, joint swelling, arthralgias and gait problem.  Skin: Negative for color change, pallor, rash and wound.  Neurological: Negative for dizziness, tremors, weakness and light-headedness.  Hematological: Negative for adenopathy. Does not bruise/bleed easily.  Psychiatric/Behavioral: Negative for behavioral problems, confusion, sleep disturbance, dysphoric mood, decreased concentration and agitation.       Objective:   Physical Exam  Constitutional: He is oriented to person, place, and time. He appears well-developed and well-nourished. No distress.  HENT:  Head: Normocephalic and atraumatic.  Mouth/Throat: Oropharynx is clear and moist. No  oropharyngeal exudate.  Eyes: Conjunctivae and EOM are normal. Pupils are equal, round, and reactive to light. No scleral icterus.  Neck: Normal range of motion. Neck supple. No JVD present.  Cardiovascular: Normal rate and regular rhythm.   Pulmonary/Chest: Effort normal. No respiratory distress. He has no wheezes.  Abdominal: He exhibits no distension.  Musculoskeletal: He exhibits no edema or tenderness.  Lymphadenopathy:    He has no cervical adenopathy.  Neurological: He is alert and oriented to person, place, and time. He has normal reflexes. He exhibits normal muscle tone. Coordination normal.  Skin: Skin is warm and dry. He is not diaphoretic. No erythema. No pallor.  Psychiatric: He has a normal mood and affect. His behavior is normal. Judgment and thought content normal.          Assessment & Plan:   #1 HIV disease with hx of AIDS: Continue TRIUMEQ , HIVweekly controlled and now he's going to look have insurance and so should not need AIDS drug assistance program anymore. I've given him a [facility] card [facility] and he can also potentially make use of the patient assistant network if necessary. I spent greater than 25 minutes with the patient including greater than 50% of time in face to face counsel of the patient and in coordination of their care.

## 2014-07-27 ENCOUNTER — Other Ambulatory Visit: Payer: 59

## 2014-07-27 DIAGNOSIS — B20 Human immunodeficiency virus [HIV] disease: Secondary | ICD-10-CM

## 2014-07-27 LAB — CBC WITH DIFFERENTIAL/PLATELET
Basophils Absolute: 0 10*3/uL (ref 0.0–0.1)
Basophils Relative: 1 % (ref 0–1)
Eosinophils Absolute: 0.1 10*3/uL (ref 0.0–0.7)
Eosinophils Relative: 3 % (ref 0–5)
HCT: 40.5 % (ref 39.0–52.0)
Hemoglobin: 13.7 g/dL (ref 13.0–17.0)
Lymphocytes Relative: 36 % (ref 12–46)
Lymphs Abs: 1.4 10*3/uL (ref 0.7–4.0)
MCH: 29.5 pg (ref 26.0–34.0)
MCHC: 33.8 g/dL (ref 30.0–36.0)
MCV: 87.3 fL (ref 78.0–100.0)
MPV: 9.2 fL (ref 8.6–12.4)
Monocytes Absolute: 0.4 10*3/uL (ref 0.1–1.0)
Monocytes Relative: 11 % (ref 3–12)
NEUTROS ABS: 1.9 10*3/uL (ref 1.7–7.7)
NEUTROS PCT: 49 % (ref 43–77)
PLATELETS: 256 10*3/uL (ref 150–400)
RBC: 4.64 MIL/uL (ref 4.22–5.81)
RDW: 14.3 % (ref 11.5–15.5)
WBC: 3.8 10*3/uL — AB (ref 4.0–10.5)

## 2014-07-27 LAB — COMPLETE METABOLIC PANEL WITH GFR
ALBUMIN: 4.2 g/dL (ref 3.5–5.2)
ALT: 16 U/L (ref 0–53)
AST: 19 U/L (ref 0–37)
Alkaline Phosphatase: 71 U/L (ref 39–117)
BUN: 10 mg/dL (ref 6–23)
CHLORIDE: 105 meq/L (ref 96–112)
CO2: 25 mEq/L (ref 19–32)
Calcium: 9.3 mg/dL (ref 8.4–10.5)
Creat: 1.17 mg/dL (ref 0.50–1.35)
GFR, EST NON AFRICAN AMERICAN: 81 mL/min
GFR, Est African American: >89 mL/min
Glucose, Bld: 77 mg/dL (ref 70–99)
POTASSIUM: 3.8 meq/L (ref 3.5–5.3)
Sodium: 141 mEq/L (ref 135–145)
Total Bilirubin: 0.4 mg/dL (ref 0.2–1.2)
Total Protein: 6.9 g/dL (ref 6.0–8.3)

## 2014-07-28 LAB — T-HELPER CELL (CD4) - (RCID CLINIC ONLY)
CD4 % Helper T Cell: 20 % — ABNORMAL LOW (ref 33–55)
CD4 T CELL ABS: 270 /uL — AB (ref 400–2700)

## 2014-07-28 LAB — URINE CYTOLOGY ANCILLARY ONLY
CHLAMYDIA, DNA PROBE: NEGATIVE
Neisseria Gonorrhea: NEGATIVE

## 2014-07-28 LAB — RPR

## 2014-07-30 LAB — HIV-1 RNA QUANT-NO REFLEX-BLD
HIV 1 RNA Quant: <20 copies/mL (ref <20)
HIV-1 RNA Quant, Log: <1.3 {Log} (ref <1.30)

## 2014-08-10 ENCOUNTER — Ambulatory Visit (INDEPENDENT_AMBULATORY_CARE_PROVIDER_SITE_OTHER): Payer: 59 | Admitting: Infectious Disease

## 2014-08-10 ENCOUNTER — Encounter: Payer: Self-pay | Admitting: Infectious Disease

## 2014-08-10 VITALS — BP 128/77 | HR 64 | Temp 98.2°F | Ht 69.0 in | Wt 193.0 lb

## 2014-08-10 DIAGNOSIS — B20 Human immunodeficiency virus [HIV] disease: Secondary | ICD-10-CM | POA: Diagnosis not present

## 2014-08-10 DIAGNOSIS — F411 Generalized anxiety disorder: Secondary | ICD-10-CM | POA: Diagnosis not present

## 2014-08-10 DIAGNOSIS — F410 Panic disorder [episodic paroxysmal anxiety] without agoraphobia: Secondary | ICD-10-CM | POA: Diagnosis not present

## 2014-08-10 HISTORY — DX: Generalized anxiety disorder: F41.1

## 2014-08-10 HISTORY — DX: Panic disorder (episodic paroxysmal anxiety): F41.0

## 2014-08-10 NOTE — Progress Notes (Signed)
Subjective:    Patient ID: Lance Craig, male    DOB: 01/22/1981, 34 y.o.   MRN: [id number]  HPI   Lance Craig is a 34 y.o. male with history of HIV/AIDS with PCP pneuonia in the past who had been doing superbly well on his  antiviral regimen, TRIUMEQ with undetectable viral load CD4 count now above 300.  Apparently he suffered what was a panic attack after his father was taken to the hospital with an acute illness. He wanted to take Ativan which he had on hand but it was old Ativan which had expired any therefore did not take it.  Inquired today about what he should due to treatment further episodes of panic attacks in the future. I informed him that now this clinic no longer was in the practice of prescribing controlled substances that included benzodiazepines or narcotics. I informed him that we had "grandfather individuals in who are on chronic benzodiazepines and opiates but that those were not on substance prescriptions were no longer being grandfather in.  I recommended that he be seen by Bernette Redbird her counselor and the establish care with a psychiatrist who could prescribe a benzodiazepine if the patient needed it. I also offered a selective serotonin reuptake inhibitor to help reduce episodes of panic attacks. Does not sound like they're happening that frequently so it would seem to me that he should not clearly need a benzodiazepine at this point. I'm not against him using these medicines but as mentioned previously our clinic is adopted a policy of no longer prescribing them.    Lab Results  Component Value Date   HIV1RNAQUANT <20 07/27/2014   Lab Results  Component Value Date   CD4TABS 270* 07/27/2014   CD4TABS 300* 03/28/2014   CD4TABS 320* 12/29/2013          Review of Systems  Constitutional: Negative for fever, chills, diaphoresis, activity change, appetite change, fatigue and unexpected weight change.  HENT: Negative for congestion, rhinorrhea, sinus pressure,  sneezing, sore throat and trouble swallowing.   Eyes: Negative for photophobia and visual disturbance.  Respiratory: Negative for cough, chest tightness, shortness of breath, wheezing and stridor.   Cardiovascular: Negative for chest pain, palpitations and leg swelling.  Gastrointestinal: Negative for nausea, vomiting, abdominal pain, diarrhea, constipation, blood in stool, abdominal distention and anal bleeding.  Genitourinary: Negative for dysuria, hematuria, flank pain and difficulty urinating.  Musculoskeletal: Negative for myalgias, joint swelling, arthralgias and gait problem.  Skin: Negative for color change, pallor, rash and wound.  Neurological: Negative for dizziness, tremors, weakness and light-headedness.  Hematological: Negative for adenopathy. Does not bruise/bleed easily.  Psychiatric/Behavioral: Positive for agitation. Negative for behavioral problems, confusion, sleep disturbance, dysphoric mood and decreased concentration.       Objective:   Physical Exam  Constitutional: He is oriented to person, place, and time. He appears well-developed and well-nourished. No distress.  HENT:  Head: Normocephalic and atraumatic.  Mouth/Throat: Oropharynx is clear and moist. No oropharyngeal exudate.  Eyes: Conjunctivae and EOM are normal. Pupils are equal, round, and reactive to light. No scleral icterus.  Neck: Normal range of motion. Neck supple. No JVD present.  Cardiovascular: Normal rate and regular rhythm.   Pulmonary/Chest: Effort normal. No respiratory distress. He has no wheezes.  Abdominal: He exhibits no distension.  Musculoskeletal: He exhibits no edema or tenderness.  Lymphadenopathy:    He has no cervical adenopathy.  Neurological: He is alert and oriented to person, place, and time. He has normal reflexes.  He exhibits normal muscle tone. Coordination normal.  Skin: Skin is warm and dry. He is not diaphoretic. No erythema. No pallor.  Psychiatric: His behavior is  normal. Judgment and thought content normal. His mood appears anxious. He exhibits a depressed mood.          Assessment & Plan:   #1 HIV disease with hx of AIDS: Continue TRIUMEQ ,   #2 generalized anxiety with recent potential panic attack: He should see Bernette RedbirdKenny for counseling and cognitive behavioral therapy I encouraged him to see a psychiatrist if he feels strongly that he needs a benzodiazepine I have offered him selective serotonin reuptake inhibitor therapy.

## 2014-08-16 ENCOUNTER — Ambulatory Visit: Payer: 59

## 2014-08-30 ENCOUNTER — Ambulatory Visit: Payer: 59

## 2014-09-15 ENCOUNTER — Other Ambulatory Visit: Payer: Self-pay | Admitting: Infectious Disease

## 2014-09-15 DIAGNOSIS — B20 Human immunodeficiency virus [HIV] disease: Secondary | ICD-10-CM

## 2014-09-16 ENCOUNTER — Other Ambulatory Visit: Payer: Self-pay | Admitting: *Deleted

## 2014-09-16 NOTE — Telephone Encounter (Signed)
PA for Triumeq rx completed and faxed to Medicaid.  Holiday weekend coming up.  May take up to 5 business days to process.

## 2014-09-18 ENCOUNTER — Encounter (HOSPITAL_COMMUNITY): Payer: Self-pay | Admitting: Emergency Medicine

## 2014-09-18 ENCOUNTER — Emergency Department (HOSPITAL_COMMUNITY)
Admission: EM | Admit: 2014-09-18 | Discharge: 2014-09-18 | Disposition: A | Discharge status: Home / Self Care | Payer: 59 | Attending: Emergency Medicine | Admitting: Emergency Medicine

## 2014-09-18 DIAGNOSIS — M25512 Pain in left shoulder: Secondary | ICD-10-CM | POA: Insufficient documentation

## 2014-09-18 DIAGNOSIS — Z8701 Personal history of pneumonia (recurrent): Secondary | ICD-10-CM | POA: Insufficient documentation

## 2014-09-18 DIAGNOSIS — M436 Torticollis: Secondary | ICD-10-CM

## 2014-09-18 DIAGNOSIS — Z8659 Personal history of other mental and behavioral disorders: Secondary | ICD-10-CM | POA: Diagnosis not present

## 2014-09-18 DIAGNOSIS — Z86718 Personal history of other venous thrombosis and embolism: Secondary | ICD-10-CM | POA: Diagnosis not present

## 2014-09-18 DIAGNOSIS — Z21 Asymptomatic human immunodeficiency virus [HIV] infection status: Secondary | ICD-10-CM | POA: Diagnosis not present

## 2014-09-18 DIAGNOSIS — Z862 Personal history of diseases of the blood and blood-forming organs and certain disorders involving the immune mechanism: Secondary | ICD-10-CM | POA: Insufficient documentation

## 2014-09-18 DIAGNOSIS — M542 Cervicalgia: Secondary | ICD-10-CM | POA: Diagnosis present

## 2014-09-18 MED ORDER — KETOROLAC TROMETHAMINE 60 MG/2ML IM SOLN
60.0000 mg | Freq: Once | INTRAMUSCULAR | Status: AC
  Administered 2014-09-18: 60 mg via INTRAMUSCULAR
  Filled 2014-09-18: qty 2

## 2014-09-18 MED ORDER — CYCLOBENZAPRINE HCL 10 MG PO TABS: 10.0000 mg | ORAL_TABLET | Freq: Three times a day (TID) | ORAL | Status: DC | PRN

## 2014-09-18 MED ORDER — CYCLOBENZAPRINE HCL 10 MG PO TABS
10.0000 mg | ORAL_TABLET | Freq: Once | ORAL | Status: AC
  Administered 2014-09-18: 10 mg via ORAL
  Filled 2014-09-18: qty 1

## 2014-09-18 NOTE — ED Notes (Signed)
Pt c/o left neck and shoulder pain. Denies recent injury/trauma. Says, "I mean I slept on it kind of awkward." Denies N/V/D/back pain. No other c/c.

## 2014-09-18 NOTE — Discharge Instructions (Signed)
Ibuprofen 600 mg 3 times daily for the next 5 days.  Flexeril as prescribed as needed for pain not relieved with ibuprofen.  Return to the emergency department if your symptoms significantly worsen or change.   Torticollis, Acute You have suddenly (acutely) developed a twisted neck (torticollis). This is usually a self-limited condition. CAUSES  Acute torticollis may be caused by malposition, trauma or infection. Most commonly, acute torticollis is caused by sleeping in an awkward position. Torticollis may also be caused by the flexion, extension or twisting of the neck muscles beyond their normal position. Sometimes, the exact cause may not be known. SYMPTOMS  Usually, there is pain and limited movement of the neck. Your neck may twist to one side. DIAGNOSIS  The diagnosis is often made by physical examination. X-rays, CT scans or MRIs may be done if there is a history of trauma or concern of infection. TREATMENT  For a common, stiff neck that develops during sleep, treatment is focused on relaxing the contracted neck muscle. Medications (including shots) may be used to treat the problem. Most cases resolve in several days. Torticollis usually responds to conservative physical therapy. If left untreated, the shortened and spastic neck muscle can cause deformities in the face and neck. Rarely, surgery is required. HOME CARE INSTRUCTIONS   Use over-the-counter and prescription medications as directed by your caregiver.  Do stretching exercises and massage the neck as directed by your caregiver.  Follow up with physical therapy if needed and as directed by your caregiver. SEEK IMMEDIATE MEDICAL CARE IF:   You develop difficulty breathing or noisy breathing (stridor).  You drool, develop trouble swallowing or have pain with swallowing.  You develop numbness or weakness in the hands or feet.  You have changes in speech or vision.  You have problems with urination or bowel  movements.  You have difficulty walking.  You have a fever.  You have increased pain. MAKE SURE YOU:   Understand these instructions.  Will watch your condition.  Will get help right away if you are not doing well or get worse. Document Released: 03/01/2000 Document Revised: 05/27/2011 Document Reviewed: 04/12/2009 [facility] Patient Information 2015 [place]. This information is not intended to replace advice given to you by your health care provider. Make sure you discuss any questions you have with your health care provider.

## 2014-09-18 NOTE — ED Provider Notes (Signed)
CSN: [id number]     Arrival date & time 09/18/14  0406 History   First MD Initiated Contact with Patient 09/18/14 0701     Chief Complaint  Patient presents with  . Neck Pain  . Shoulder Pain     (Consider location/radiation/quality/duration/timing/severity/associated sxs/prior Treatment) HPI Comments: Patient is a [age] year old male with history of HIV disease, DVT. He presents for evaluation of pain in his left neck that started yesterday evening. He states that this started on its own in the absence of any injury or trauma. He has difficulty turning his head and reports that his pain is worse with palpation. He denies any numbness, tingling, or weakness in the left arm.  Patient is a 34 y.o. male presenting with neck pain and shoulder pain. The history is provided by the patient.  Neck Pain Pain location:  L side Quality:  Stabbing Pain radiates to:  Does not radiate Pain severity:  Moderate Onset quality:  Sudden Duration:  12 hours Timing:  Constant Progression:  Worsening Chronicity:  New Context: not fall   Relieved by:  Nothing Exacerbated by: Movement and palpation. Ineffective treatments:  None tried Associated symptoms: no bladder incontinence, no bowel incontinence, no numbness, no paresis and no weakness   Shoulder Pain Associated symptoms: neck pain     Past Medical History  Diagnosis Date  . HIV infection   . Deep venous thrombosis   . Depression   . Anemia   . Anxiety   . PCP (pneumocystis jiroveci pneumonia)   . Panic attack 08/10/2014  . Generalized anxiety disorder 08/10/2014   History reviewed. No pertinent past surgical history. History reviewed. No pertinent family history. History  Substance Use Topics  . Smoking status: Never Smoker   . Smokeless tobacco: Never Used  . Alcohol Use: 0.6 oz/week    1 Standard drinks or equivalent per week     Comment: wine    Review of Systems  Gastrointestinal: Negative for bowel incontinence.  Genitourinary:  Negative for bladder incontinence.  Musculoskeletal: Positive for neck pain.  Neurological: Negative for weakness and numbness.  All other systems reviewed and are negative.     Allergies  Zithromax  Home Medications   Prior to Admission medications   Medication Sig Start Date End Date Taking? Authorizing Provider  acetaminophen (TYLENOL) 500 MG tablet Take 500 mg by mouth every 6 (six) hours as needed for moderate pain.   Yes Historical Provider, MD  TRIUMEQ 600-50-300 MG TABS TAKE 1 TABLET BY MOUTH DAILY 09/15/14  Yes Randall Hissornelius N Van Dam, MD  loratadine (CLARITIN) 10 MG tablet TAKE 1 TABLET BY MOUTH DAILY Patient not taking: Reported on 09/18/2014 08/31/12   Cliffton AstersJohn Campbell, MD   BP 153/95 mmHg  Pulse 69  Temp(Src) 97.6 F (36.4 C) (Oral)  Resp 18  SpO2 100% Physical Exam  Constitutional: He is oriented to person, place, and time. He appears well-developed and well-nourished. No distress.  HENT:  Head: Normocephalic and atraumatic.  Mouth/Throat: Oropharynx is clear and moist.  Neck:  There is tenderness to palpation in the soft tissues of the left lateral neck. There is no palpable abnormality otherwise.  Lymphadenopathy:    He has no cervical adenopathy.  Neurological: He is alert and oriented to person, place, and time.  Strength is 5 out of 5 in the bilateral upper extremities. He is able to flex, extend, and oppose all fingers. Sensation is intact to both hands. Ulnar and radial pulses are easily palpable.  Skin: Skin  is warm and dry. He is not diaphoretic.  Nursing note and vitals reviewed.   ED Course  Procedures (including critical care time) Labs Review Labs Reviewed - No data to display  Imaging Review No results found.   EKG Interpretation None      MDM   Final diagnoses:  None    I suspect this is a musculoskeletal pain/torticollis. He has no fever and no other symptoms that would suggest an infectious etiology. He will be treated with ibuprofen,  Flexeril, and when necessary return.    Geoffery Lyons, MD 09/18/14 [phone]

## 2014-09-22 ENCOUNTER — Telehealth: Payer: Self-pay | Admitting: *Deleted

## 2014-09-22 NOTE — Telephone Encounter (Signed)
PA approved for pt's Triumeq rx.  Pt picked up rx 09/16/14.

## 2014-10-06 ENCOUNTER — Other Ambulatory Visit: Payer: Self-pay | Admitting: *Deleted

## 2014-10-06 DIAGNOSIS — B20 Human immunodeficiency virus [HIV] disease: Secondary | ICD-10-CM

## 2014-10-06 MED ORDER — ABACAVIR-DOLUTEGRAVIR-LAMIVUD 600-50-300 MG PO TABS: 1.0000 | ORAL_TABLET | Freq: Every day | ORAL | Status: DC

## 2014-10-13 ENCOUNTER — Emergency Department (HOSPITAL_BASED_OUTPATIENT_CLINIC_OR_DEPARTMENT_OTHER)
Admission: EM | Admit: 2014-10-13 | Discharge: 2014-10-13 | Disposition: A | Discharge status: Home / Self Care | Payer: 59 | Attending: Emergency Medicine | Admitting: Emergency Medicine

## 2014-10-13 ENCOUNTER — Encounter (HOSPITAL_BASED_OUTPATIENT_CLINIC_OR_DEPARTMENT_OTHER): Payer: Self-pay | Admitting: Emergency Medicine

## 2014-10-13 ENCOUNTER — Telehealth: Payer: Self-pay | Admitting: *Deleted

## 2014-10-13 DIAGNOSIS — K625 Hemorrhage of anus and rectum: Secondary | ICD-10-CM

## 2014-10-13 DIAGNOSIS — Z86718 Personal history of other venous thrombosis and embolism: Secondary | ICD-10-CM | POA: Insufficient documentation

## 2014-10-13 DIAGNOSIS — Z79899 Other long term (current) drug therapy: Secondary | ICD-10-CM | POA: Diagnosis not present

## 2014-10-13 DIAGNOSIS — K5900 Constipation, unspecified: Secondary | ICD-10-CM | POA: Insufficient documentation

## 2014-10-13 DIAGNOSIS — Z8701 Personal history of pneumonia (recurrent): Secondary | ICD-10-CM | POA: Diagnosis not present

## 2014-10-13 DIAGNOSIS — Z862 Personal history of diseases of the blood and blood-forming organs and certain disorders involving the immune mechanism: Secondary | ICD-10-CM | POA: Insufficient documentation

## 2014-10-13 DIAGNOSIS — Z8659 Personal history of other mental and behavioral disorders: Secondary | ICD-10-CM | POA: Insufficient documentation

## 2014-10-13 DIAGNOSIS — B20 Human immunodeficiency virus [HIV] disease: Secondary | ICD-10-CM | POA: Insufficient documentation

## 2014-10-13 LAB — OCCULT BLOOD X 1 CARD TO LAB, STOOL: Fecal Occult Bld: POSITIVE — AB

## 2014-10-13 MED ORDER — POLYETHYLENE GLYCOL 3350 17 G PO PACK: 17.0000 g | PACK | Freq: Every day | ORAL | Status: DC

## 2014-10-13 NOTE — ED Notes (Signed)
Pain after a BM 2-3 days ago. Used preparation H  Didn't seem to help.  This am with BM  Had pain and some bleeding

## 2014-10-13 NOTE — Telephone Encounter (Signed)
Patient was seen at the ED for constipation and given miralax. He was told to follow up with Dr. Daiva Eves in 2 days and next available appt is end of August. Advised patient if the miralax works he does not need follow up, however; if he does not have improvement he can call back for an appt with one of the clinic MDs. Also suggested that he increase his water and fiber in the form of fruits and vegetables. Patient agreed to this plan.

## 2014-10-13 NOTE — Discharge Instructions (Signed)
Return to the ED with any concerns including worsening bleeding, abdominal pain, rectal pain, fever/chills, decreased level of alertness/lethargy, or any other alarming symptoms

## 2014-10-13 NOTE — ED Provider Notes (Signed)
CSN: [id number]     Arrival date & time 10/13/14  0908 History   First MD Initiated Contact with Patient 10/13/14 [phone]     Chief Complaint  Patient presents with  . Hemorrhoids     (Consider location/radiation/quality/duration/timing/severity/associated sxs/prior Treatment) HPI  Pt presenting with c/o bleeding with passing hard bowel movement this morning.  Saw bright red blood.  No abdominal pain.  He was straining to pass the bowel movement.  Several days ago he thought he felt a hemorrhoid and had been using preparation H, this has now resolved.  No abdominal pain.  No rectal pain.  States he has been eating fast food for the past several weeks and not eating vegetables.  There are no other associated systemic symptoms, there are no other alleviating or modifying factors.   Past Medical History  Diagnosis Date  . HIV infection   . Deep venous thrombosis   . Depression   . Anemia   . Anxiety   . PCP (pneumocystis jiroveci pneumonia)   . Panic attack 08/10/2014  . Generalized anxiety disorder 08/10/2014   History reviewed. No pertinent past surgical history. No family history on file. History  Substance Use Topics  . Smoking status: Never Smoker   . Smokeless tobacco: Never Used  . Alcohol Use: 0.6 oz/week    1 Standard drinks or equivalent per week     Comment: wine    Review of Systems  ROS reviewed and all otherwise negative except for mentioned in HPI    Allergies  Zithromax  Home Medications   Prior to Admission medications   Medication Sig Start Date End Date Taking? Authorizing Provider  Abacavir-Dolutegravir-Lamivud (TRIUMEQ) 600-50-300 MG TABS Take 1 tablet by mouth daily. 10/06/14   Randall Hiss, MD  acetaminophen (TYLENOL) 500 MG tablet Take 500 mg by mouth every 6 (six) hours as needed for moderate pain.    Historical Provider, MD  cyclobenzaprine (FLEXERIL) 10 MG tablet Take 1 tablet (10 mg total) by mouth 3 (three) times daily as needed for muscle  spasms. 09/18/14   Geoffery Lyons, MD  loratadine (CLARITIN) 10 MG tablet TAKE 1 TABLET BY MOUTH DAILY Patient not taking: Reported on 09/18/2014 08/31/12   Cliffton Asters, MD  polyethylene glycol [facility]) packet Take 17 g by mouth daily. 10/13/14   Jerelyn Scott, MD   BP 136/93 mmHg  Pulse 66  Temp(Src) 97.6 F (36.4 C) (Oral)  Resp 16  Ht  (1.753 m)  Wt 175 lb (79.379 kg)  BMI 25.83 kg/m2  SpO2 100%  Vitals reviewed Physical Exam  Physical Examination: General appearance - alert, well appearing, and in no distress Mental status - alert, oriented to person, place, and time Eyes - no conjunctival injection, no scleral icterus Mouth - mucous membranes moist, pharynx normal without lesions Chest - clear to auscultation, no wheezes, rales or rhonchi, symmetric air entry Heart - normal rate, regular rhythm, normal S1, S2, no murmurs, rubs, clicks or gallops Abdomen - soft, nontender, nondistended, no masses or organomegaly Rectal - negative without mass, lesions or tenderness, no anal fissure, no hemorrhoid, no gross blood on DRE Extremities - peripheral pulses normal, no pedal edema, no clubbing or cyanosis Skin - normal coloration and turgor, no rashes  ED Course  Procedures (including critical care time) Labs Review Labs Reviewed  OCCULT BLOOD X 1 CARD TO LAB, STOOL - Abnormal; Notable for the [id number] Occult Bld POSITIVE (*)    All other  components within normal limits    Imaging Review No results found.   EKG Interpretation None      MDM   Final diagnoses:  Rectal bleeding  Constipation, unspecified constipation type    Pt presenting with c/o constipation, some bright red blood per rectum, concern about hemorrrhoid. On exam he has benign abdomen, no hemorrhoid or visible anal fissure.  No blood on DRE, guiac negative.  Discharged with strict return precautions.  Pt agreeable with plan.  Prior records reviewed and considered during this visit   Jerelyn Scott, MD 10/15/14 [phone]

## 2014-12-13 ENCOUNTER — Other Ambulatory Visit: Payer: Self-pay | Admitting: Infectious Disease

## 2015-01-18 ENCOUNTER — Other Ambulatory Visit: Payer: Self-pay | Admitting: Infectious Disease

## 2015-06-25 ENCOUNTER — Other Ambulatory Visit: Payer: Self-pay | Admitting: Infectious Disease

## 2015-07-25 ENCOUNTER — Other Ambulatory Visit: Payer: Self-pay | Admitting: Infectious Disease

## 2015-07-27 ENCOUNTER — Other Ambulatory Visit: Payer: Self-pay | Admitting: *Deleted

## 2015-07-27 DIAGNOSIS — B20 Human immunodeficiency virus [HIV] disease: Secondary | ICD-10-CM

## 2015-07-27 MED ORDER — ABACAVIR-DOLUTEGRAVIR-LAMIVUD 600-50-300 MG PO TABS: 1.0000 | ORAL_TABLET | Freq: Every day | ORAL | Status: DC

## 2015-09-06 ENCOUNTER — Other Ambulatory Visit: Payer: 59

## 2015-09-06 DIAGNOSIS — B20 Human immunodeficiency virus [HIV] disease: Secondary | ICD-10-CM

## 2015-09-06 LAB — CBC WITH DIFFERENTIAL/PLATELET
BASOS ABS: 42 {cells}/uL (ref 0–200)
Basophils Relative: 1 %
EOS PCT: 2 %
Eosinophils Absolute: 84 cells/uL (ref 15–500)
HCT: 39.9 % (ref 38.5–50.0)
Hemoglobin: 13.2 g/dL (ref 13.2–17.1)
Lymphocytes Relative: 46 %
Lymphs Abs: 1932 cells/uL (ref 850–3900)
MCH: 28.9 pg (ref 27.0–33.0)
MCHC: 33.1 g/dL (ref 32.0–36.0)
MCV: 87.5 fL (ref 80.0–100.0)
MONOS PCT: 11 %
MPV: 8.6 fL (ref 7.5–12.5)
Monocytes Absolute: 462 cells/uL (ref 200–950)
NEUTROS ABS: 1680 {cells}/uL (ref 1500–7800)
Neutrophils Relative %: 40 %
PLATELETS: 345 10*3/uL (ref 140–400)
RBC: 4.56 MIL/uL (ref 4.20–5.80)
RDW: 13.5 % (ref 11.0–15.0)
WBC: 4.2 10*3/uL (ref 3.8–10.8)

## 2015-09-06 LAB — LIPID PANEL
CHOL/HDL RATIO: 3.7 ratio (ref <=5.0)
Cholesterol: 133 mg/dL (ref 125–200)
HDL: 36 mg/dL — AB (ref >=40)
LDL CALC: 80 mg/dL (ref <130)
Triglycerides: 86 mg/dL (ref <150)
VLDL: 17 mg/dL (ref <30)

## 2015-09-06 LAB — COMPLETE METABOLIC PANEL WITH GFR
ALT: 15 U/L (ref 9–46)
AST: 22 U/L (ref 10–40)
Albumin: 4 g/dL (ref 3.6–5.1)
Alkaline Phosphatase: 98 U/L (ref 40–115)
BUN: 12 mg/dL (ref 7–25)
CO2: 23 mmol/L (ref 20–31)
Calcium: 9.1 mg/dL (ref 8.6–10.3)
Chloride: 104 mmol/L (ref 98–110)
Creat: 1.13 mg/dL (ref 0.60–1.35)
GFR, EST NON AFRICAN AMERICAN: 84 mL/min (ref >=60)
GFR, Est African American: >89 mL/min (ref >=60)
GLUCOSE: 79 mg/dL (ref 65–99)
POTASSIUM: 3.9 mmol/L (ref 3.5–5.3)
SODIUM: 138 mmol/L (ref 135–146)
TOTAL PROTEIN: 7.4 g/dL (ref 6.1–8.1)
Total Bilirubin: 0.4 mg/dL (ref 0.2–1.2)

## 2015-09-07 LAB — RPR: RPR: REACTIVE — AB

## 2015-09-07 LAB — HIV-1 RNA QUANT-NO REFLEX-BLD

## 2015-09-07 LAB — MICROALBUMIN / CREATININE URINE RATIO
CREATININE, URINE: 389 mg/dL — AB (ref 20–370)
MICROALB UR: 0.7 mg/dL
Microalb Creat Ratio: 2 mcg/mg creat (ref <30)

## 2015-09-07 LAB — RPR TITER

## 2015-09-07 LAB — T-HELPER CELL (CD4) - (RCID CLINIC ONLY)
CD4 T CELL HELPER: 21 % — AB (ref 33–55)
CD4 T Cell Abs: 420 /uL (ref 400–2700)

## 2015-09-07 LAB — FLUORESCENT TREPONEMAL AB(FTA)-IGG-BLD: Fluorescent Treponemal ABS: REACTIVE — AB

## 2015-09-07 LAB — URINE CYTOLOGY ANCILLARY ONLY
Chlamydia: NEGATIVE
Neisseria Gonorrhea: NEGATIVE

## 2015-09-11 ENCOUNTER — Telehealth: Payer: Self-pay | Admitting: *Deleted

## 2015-09-11 NOTE — Telephone Encounter (Signed)
Left generic message asking patient to call Marcelino DusterMichelle at his doctor's office for lab results and treatment.  Andree CossHowell, Michelle M, RN

## 2015-09-11 NOTE — Telephone Encounter (Signed)
-----   Message from Randall Hissornelius N Van Dam, MD sent at 09/08/2015 12:44 PM EDT ----- He should have PCN 2.4 MU IM weekly x 3 unless there is still a shortage in which case I am ok with one dose

## 2015-09-12 NOTE — Telephone Encounter (Signed)
Patient returned call, will come 6/28 for injections.  Sent message to Dr. Daiva EvesVan Dam to confirm treatment of 1 week or 3 weeks.  Patient scheduled to see Dr. Daiva EvesVan Dam the next Wednesday, where he can receive the second injection if appropriate.

## 2015-09-12 NOTE — Telephone Encounter (Signed)
Patient to receive 3 injections bicillin per verbal order from Dr. Daiva EvesVan Dam.  Appointments scheduled.

## 2015-09-13 ENCOUNTER — Ambulatory Visit (INDEPENDENT_AMBULATORY_CARE_PROVIDER_SITE_OTHER): Payer: 59

## 2015-09-13 DIAGNOSIS — A539 Syphilis, unspecified: Secondary | ICD-10-CM

## 2015-09-13 MED ORDER — PENICILLIN G BENZATHINE 1200000 UNIT/2ML IM SUSP: 1.2000 10*6.[IU] | INTRAMUSCULAR | Status: DC

## 2015-09-13 MED ORDER — PENICILLIN G BENZATHINE 1200000 UNIT/2ML IM SUSP
1.2000 10*6.[IU] | Freq: Once | INTRAMUSCULAR | Status: AC
  Administered 2015-09-13 (×2): 1.2 10*6.[IU] via INTRAMUSCULAR

## 2015-09-13 NOTE — Progress Notes (Signed)
Patient monitored for 15 minutes since was his first time receiving bicillin injections. He was released with no problems.   Laurell Josephsammy K King, RN

## 2015-09-20 ENCOUNTER — Encounter: Payer: Self-pay | Admitting: Infectious Disease

## 2015-09-20 ENCOUNTER — Ambulatory Visit (INDEPENDENT_AMBULATORY_CARE_PROVIDER_SITE_OTHER): Payer: 59 | Admitting: Infectious Disease

## 2015-09-20 ENCOUNTER — Ambulatory Visit: Payer: Self-pay | Admitting: *Deleted

## 2015-09-20 VITALS — BP 130/80 | HR 69 | Temp 97.3°F | Ht 69.0 in | Wt 175.0 lb

## 2015-09-20 DIAGNOSIS — F0630 Mood disorder due to known physiological condition, unspecified: Secondary | ICD-10-CM

## 2015-09-20 DIAGNOSIS — B20 Human immunodeficiency virus [HIV] disease: Secondary | ICD-10-CM | POA: Diagnosis not present

## 2015-09-20 DIAGNOSIS — A539 Syphilis, unspecified: Secondary | ICD-10-CM

## 2015-09-20 DIAGNOSIS — F411 Generalized anxiety disorder: Secondary | ICD-10-CM | POA: Diagnosis not present

## 2015-09-20 HISTORY — DX: Syphilis, unspecified: A53.9

## 2015-09-20 MED ORDER — PENICILLIN G BENZATHINE 1200000 UNIT/2ML IM SUSP
1.2000 10*6.[IU] | Freq: Once | INTRAMUSCULAR | Status: AC
  Administered 2015-09-20: 1.2 10*6.[IU] via INTRAMUSCULAR

## 2015-09-20 NOTE — Progress Notes (Signed)
Chief complaint: followup for HIV, depression and syphilis  Subjective:    Patient ID: Lance BrilliantHaywood J Lachman, male    DOB: 10-02-1980, 35 y.o.   MRN: [id number]  HPI   Lance Craig is a 35 y.o. male with history of HIV/AIDS with PCP pneuonia in the past who had been doing superbly well on his  antiviral regimen, TRIUMEQ with undetectable viral load CD4 count now above 300.  When I last saw him he apparently he suffered what was a panic attack after his father was taken to the hospital with an acute illness. He wanted to take Ativan which he had on hand but it was old Ativan which had expired any therefore did not take it.  He has tested + for syphilis and is receiving IM PCN. He has been sexually active. GC and chlamydia negative fromr urine but not tested in rectum and oropharynx.   He states that he is no longer having trouble with anxiety and similarly no problems with depression. He does not want an SSRI   Lab Results  Component Value Date   HIV1RNAQUANT <20 09/06/2015   HIV1RNAQUANT <20 07/27/2014   HIV1RNAQUANT <20 03/28/2014     Lab Results  Component Value Date   CD4TABS 420 09/06/2015   CD4TABS 270* 07/27/2014   CD4TABS 300* 03/28/2014          Review of Systems  Constitutional: Negative for fever, chills, diaphoresis, activity change, appetite change, fatigue and unexpected weight change.  HENT: Negative for congestion, rhinorrhea, sinus pressure, sneezing, sore throat and trouble swallowing.   Eyes: Negative for photophobia and visual disturbance.  Respiratory: Negative for cough, chest tightness, shortness of breath, wheezing and stridor.   Cardiovascular: Negative for chest pain, palpitations and leg swelling.  Gastrointestinal: Negative for nausea, vomiting, abdominal pain, diarrhea, constipation, blood in stool, abdominal distention and anal bleeding.  Genitourinary: Negative for dysuria, hematuria, flank pain and difficulty urinating.  Musculoskeletal:  Negative for myalgias, joint swelling, arthralgias and gait problem.  Skin: Negative for color change, pallor, rash and wound.  Neurological: Negative for dizziness, tremors, weakness and light-headedness.  Hematological: Negative for adenopathy. Does not bruise/bleed easily.  Psychiatric/Behavioral: Negative for behavioral problems, confusion, sleep disturbance, dysphoric mood and decreased concentration.       Objective:   Physical Exam  Constitutional: He is oriented to person, place, and time. He appears well-developed and well-nourished. No distress.  HENT:  Head: Normocephalic and atraumatic.  Mouth/Throat: Oropharynx is clear and moist. No oropharyngeal exudate.  Eyes: Conjunctivae and EOM are normal. Pupils are equal, round, and reactive to light. No scleral icterus.  Neck: Normal range of motion. Neck supple. No JVD present.  Cardiovascular: Normal rate and regular rhythm.   Pulmonary/Chest: Effort normal. No respiratory distress. He has no wheezes.  Abdominal: He exhibits no distension.  Musculoskeletal: He exhibits no edema or tenderness.  Lymphadenopathy:    He has no cervical adenopathy.  Neurological: He is alert and oriented to person, place, and time. He has normal reflexes. He exhibits normal muscle tone. Coordination normal.  Skin: Skin is warm and dry. He is not diaphoretic. No erythema. No pallor.  Psychiatric: His behavior is normal. Judgment and thought content normal. His mood appears anxious. He exhibits a depressed mood.          Assessment & Plan:   #1 HIV disease with hx of AIDS: Continue TRIUMEQ ,   #2 generalized anxiety with panic attack in May. Now doing well. To meet with  Lennox LaityJodi but denies isseus right now  #3 Syphlils: get full course and test for STI's  I spent greater than 25 minutes with the patient including greater than 50% of time in face to face counsel of the patient re his HVI, his STI risk, syphilis depression and in coordination of his  care.

## 2015-09-20 NOTE — BH Specialist Note (Signed)
Counselor met with Lance Craig in the exam room for a warm hand off.  Patient was oriented times four with good affect and dress.  Patient was alert and talkative with counselor.  Counselor made introductions and communicated to patient about counseling services.  Patient said that he had been a patient since 2011 and had never used the counseling services.  Patient said he would like to make an appointment as he struggles with depression at times.  Counselor provided support and encouragement accordingly with patient.  Counselor recommended that patient make an appointment to further process what is going on in his life. Patient agreed that was something he should probably be doing.  Rolena Infante, MA, LPC [facility]

## 2015-09-20 NOTE — Addendum Note (Signed)
Addended by: Rejeana BrockMURRAY, Sequo[place] Mincey A on: 09/20/2015 05:23 PM   Modules accepted: Orders

## 2015-09-21 LAB — URINE CYTOLOGY ANCILLARY ONLY
Chlamydia: NEGATIVE
Neisseria Gonorrhea: NEGATIVE

## 2015-09-21 LAB — CYTOLOGY, (ORAL, ANAL, URETHRAL) ANCILLARY ONLY
Chlamydia: NEGATIVE
Chlamydia: NEGATIVE
NEISSERIA GONORRHEA: NEGATIVE
NEISSERIA GONORRHEA: NEGATIVE

## 2015-09-27 ENCOUNTER — Ambulatory Visit (INDEPENDENT_AMBULATORY_CARE_PROVIDER_SITE_OTHER): Payer: 59 | Admitting: *Deleted

## 2015-09-27 DIAGNOSIS — A539 Syphilis, unspecified: Secondary | ICD-10-CM

## 2015-09-27 MED ORDER — PENICILLIN G BENZATHINE 1200000 UNIT/2ML IM SUSP
1.2000 10*6.[IU] | Freq: Once | INTRAMUSCULAR | Status: AC
  Administered 2015-09-27: 1.2 10*6.[IU] via INTRAMUSCULAR

## 2015-09-27 NOTE — Patient Instructions (Signed)
Pt educated regarding sexual transfer of syphilis.  RN explained dental dams and there use to the pt.  Pt given dental dams.

## 2015-10-23 ENCOUNTER — Other Ambulatory Visit: Payer: Self-pay | Admitting: Infectious Disease

## 2015-10-23 DIAGNOSIS — B20 Human immunodeficiency virus [HIV] disease: Secondary | ICD-10-CM

## 2016-01-10 ENCOUNTER — Other Ambulatory Visit (HOSPITAL_COMMUNITY)
Admission: RE | Admit: 2016-01-10 | Discharge: 2016-01-10 | Disposition: A | Discharge status: Home / Self Care | Payer: 59 | Source: Ambulatory Visit | Attending: Infectious Disease | Admitting: Infectious Disease

## 2016-01-10 ENCOUNTER — Other Ambulatory Visit: Payer: 59

## 2016-01-10 DIAGNOSIS — Z113 Encounter for screening for infections with a predominantly sexual mode of transmission: Secondary | ICD-10-CM | POA: Diagnosis present

## 2016-01-10 DIAGNOSIS — B20 Human immunodeficiency virus [HIV] disease: Secondary | ICD-10-CM

## 2016-01-10 LAB — COMPLETE METABOLIC PANEL WITH GFR
ALT: 15 U/L (ref 9–46)
AST: 25 U/L (ref 10–40)
Albumin: 4.2 g/dL (ref 3.6–5.1)
Alkaline Phosphatase: 80 U/L (ref 40–115)
BILIRUBIN TOTAL: 0.4 mg/dL (ref 0.2–1.2)
BUN: 11 mg/dL (ref 7–25)
CO2: 26 mmol/L (ref 20–31)
CREATININE: 1.23 mg/dL (ref 0.60–1.35)
Calcium: 9.1 mg/dL (ref 8.6–10.3)
Chloride: 107 mmol/L (ref 98–110)
GFR, EST AFRICAN AMERICAN: 88 mL/min (ref >=60)
GFR, Est Non African American: 76 mL/min (ref >=60)
GLUCOSE: 89 mg/dL (ref 65–99)
Potassium: 3.8 mmol/L (ref 3.5–5.3)
Sodium: 141 mmol/L (ref 135–146)
TOTAL PROTEIN: 6.8 g/dL (ref 6.1–8.1)

## 2016-01-10 LAB — CBC WITH DIFFERENTIAL/PLATELET
BASOS PCT: 1 %
Basophils Absolute: 52 cells/uL (ref 0–200)
EOS ABS: 52 {cells}/uL (ref 15–500)
Eosinophils Relative: 1 %
HCT: 38.4 % — ABNORMAL LOW (ref 38.5–50.0)
Hemoglobin: 13 g/dL — ABNORMAL LOW (ref 13.2–17.1)
LYMPHS ABS: 2288 {cells}/uL (ref 850–3900)
Lymphocytes Relative: 44 %
MCH: 30 pg (ref 27.0–33.0)
MCHC: 33.9 g/dL (ref 32.0–36.0)
MCV: 88.5 fL (ref 80.0–100.0)
MONO ABS: 624 {cells}/uL (ref 200–950)
MONOS PCT: 12 %
MPV: 8.8 fL (ref 7.5–12.5)
NEUTROS ABS: 2184 {cells}/uL (ref 1500–7800)
Neutrophils Relative %: 42 %
PLATELETS: 226 10*3/uL (ref 140–400)
RBC: 4.34 MIL/uL (ref 4.20–5.80)
RDW: 13.9 % (ref 11.0–15.0)
WBC: 5.2 10*3/uL (ref 3.8–10.8)

## 2016-01-11 LAB — T-HELPER CELL (CD4) - (RCID CLINIC ONLY)
CD4 T CELL HELPER: 20 % — AB (ref 33–55)
CD4 T Cell Abs: 480 /uL (ref 400–2700)

## 2016-01-11 LAB — URINE CYTOLOGY ANCILLARY ONLY
Chlamydia: NEGATIVE
NEISSERIA GONORRHEA: NEGATIVE

## 2016-01-11 LAB — RPR

## 2016-01-12 LAB — HIV-1 RNA QUANT-NO REFLEX-BLD
HIV 1 RNA QUANT: 31 {copies}/mL — AB (ref <20)
HIV-1 RNA Quant, Log: 1.49 Log copies/mL — ABNORMAL HIGH (ref <1.30)

## 2016-01-19 ENCOUNTER — Telehealth: Payer: Self-pay | Admitting: *Deleted

## 2016-01-19 NOTE — Telephone Encounter (Signed)
Patient called c/o chills and fever the past two days. States does not "feel horrible" and he has an appt with Dr. Daiva EvesVan Dam on 01/24/16. Advised he can be evaluated at Urgent Care. Has been taking Tyelnol and I suggested he also make sure he drinks plenty of fluids, (water preferably). He stated that a co worker also had these symptoms recently. He will keep the f/u appt with Dr. Daiva EvesVan Dam on Wednesday.

## 2016-01-20 ENCOUNTER — Encounter (HOSPITAL_BASED_OUTPATIENT_CLINIC_OR_DEPARTMENT_OTHER): Payer: Self-pay | Admitting: Emergency Medicine

## 2016-01-20 ENCOUNTER — Emergency Department (HOSPITAL_BASED_OUTPATIENT_CLINIC_OR_DEPARTMENT_OTHER)
Admission: EM | Admit: 2016-01-20 | Discharge: 2016-01-20 | Disposition: A | Discharge status: Home / Self Care | Payer: 59 | Attending: Emergency Medicine | Admitting: Emergency Medicine

## 2016-01-20 DIAGNOSIS — R509 Fever, unspecified: Secondary | ICD-10-CM | POA: Diagnosis present

## 2016-01-20 DIAGNOSIS — Z21 Asymptomatic human immunodeficiency virus [HIV] infection status: Secondary | ICD-10-CM | POA: Insufficient documentation

## 2016-01-20 DIAGNOSIS — R232 Flushing: Secondary | ICD-10-CM | POA: Diagnosis not present

## 2016-01-20 LAB — BASIC METABOLIC PANEL
ANION GAP: 9 (ref 5–15)
BUN: 9 mg/dL (ref 6–20)
CHLORIDE: 102 mmol/L (ref 101–111)
CO2: 26 mmol/L (ref 22–32)
Calcium: 9.2 mg/dL (ref 8.9–10.3)
Creatinine, Ser: 1.21 mg/dL (ref 0.61–1.24)
GFR calc non Af Amer: >60 mL/min (ref >60)
Glucose, Bld: 111 mg/dL — ABNORMAL HIGH (ref 65–99)
POTASSIUM: 3.4 mmol/L — AB (ref 3.5–5.1)
Sodium: 137 mmol/L (ref 135–145)

## 2016-01-20 LAB — CBC WITH DIFFERENTIAL/PLATELET
Basophils Absolute: 0 10*3/uL (ref 0.0–0.1)
Basophils Relative: 0 %
Eosinophils Absolute: 0 10*3/uL (ref 0.0–0.7)
Eosinophils Relative: 1 %
HEMATOCRIT: 43.7 % (ref 39.0–52.0)
HEMOGLOBIN: 14.9 g/dL (ref 13.0–17.0)
LYMPHS ABS: 2 10*3/uL (ref 0.7–4.0)
LYMPHS PCT: 32 %
MCH: 29.9 pg (ref 26.0–34.0)
MCHC: 34.1 g/dL (ref 30.0–36.0)
MCV: 87.6 fL (ref 78.0–100.0)
MONOS PCT: 20 %
Monocytes Absolute: 1.3 10*3/uL — ABNORMAL HIGH (ref 0.1–1.0)
NEUTROS ABS: 3 10*3/uL (ref 1.7–7.7)
NEUTROS PCT: 47 %
Platelets: 226 10*3/uL (ref 150–400)
RBC: 4.99 MIL/uL (ref 4.22–5.81)
RDW: 13.7 % (ref 11.5–15.5)
WBC: 6.4 10*3/uL (ref 4.0–10.5)

## 2016-01-20 NOTE — ED Provider Notes (Signed)
[facility]   CSN: [id number] Arrival date & time: 01/20/16  0935     History   Chief Complaint Chief Complaint  Patient presents with  . Fever    HPI Lance Craig is a 35 y.o. male.  HPI Pt has a history of HIV/AIDS with PCP pneumonia currently on  antiviral regimen, TRIUMEQ with undetectable viral load and CD4 count now above 300.  For the past week the patient has been having episodes of feeling chilled and hot. He has not been sleeping very well and has had strange dreams. He also  has been having episodes of sweating at night. He has not been coughing. He denies any shortness of breath. He denies any vomiting or diarrhea. He does not have a thermometer at home so he has not been able to take his temperature.  The patient has plans to see his infectious disease doctor next week but he was supposed to work this weekend and he was not feeling well this morning so he decided to come in the emergency room to get checked out. He was worried he could be coming down with the flu because he has not had his flu shot this year.  Patient has been taking his medications. No foreign travel. No known ill contacts. Past Medical History:  Diagnosis Date  . Anemia   . Anxiety   . Deep venous thrombosis (HCC)   . Depression   . Generalized anxiety disorder 08/10/2014  . HIV infection (HCC)   . Panic attack 08/10/2014  . PCP (pneumocystis jiroveci pneumonia) (HCC)   . Syphilis 09/20/2015    Patient Active Problem List   Diagnosis Date Noted  . Syphilis 09/20/2015  . Panic attack 08/10/2014  . Generalized anxiety disorder 08/10/2014  . PCP (pneumocystis jiroveci pneumonia) (HCC)   . Lumbago 10/14/2012  . Deep venous thrombosis (HCC)   . Weight gain 07/10/2011  . Seasonal allergies 06/28/2011  . Joint pain 02/21/2011  . Folic acid deficiency 07/31/2010  . Long term current use of anticoagulant 04/07/2010  . AIDS (HCC) 01/01/2010  . PNEUMOCYSTIS PNEUMONIA  01/01/2010  . 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 DVT 12/22/2009  . PE 12/16/2009    History reviewed. No pertinent surgical history.     Home Medications    Prior to Admission medications   Medication Sig Start Date End Date Taking? Authorizing Provider  acetaminophen (TYLENOL) 500 MG tablet Take 500 mg by mouth every 6 (six) hours as needed for moderate pain.    Historical Provider, MD  cyclobenzaprine (FLEXERIL) 10 MG tablet Take 1 tablet (10 mg total) by mouth 3 (three) times daily as needed for muscle spasms. Patient not taking: Reported on 09/20/2015 09/18/14   Geoffery Lyonsouglas Delo, MD  loratadine (CLARITIN) 10 MG tablet TAKE 1 TABLET BY MOUTH DAILY Patient not taking: Reported on 09/20/2015 08/31/12   Cliffton AstersJohn Campbell, MD  polyethylene glycol [facility](MIRALAX) packet Take 17 g by mouth daily. Patient not taking: Reported on 09/20/2015 10/13/14   Jerelyn ScottMartha Linker, MD  TRIUMEQ 600-50-300 MG tablet TAKE 1 TABLET BY MOUTH DAILY 10/23/15   Randall Hissornelius N Van Dam, MD    Family History No family history on file.  Social History Social History  Substance Use Topics  . Smoking status: Never Smoker  . Smokeless tobacco: Never Used  . Alcohol use 0.6 oz/week    1 Standard drinks or equivalent per week     Comment: wine     Allergies  Zithromax [azithromycin]   Review of Systems Review of Systems  Constitutional: Negative for fever.  HENT: Negative for sore throat and trouble swallowing.   Respiratory: Negative for cough and shortness of breath.   Gastrointestinal: Negative for abdominal pain.  Genitourinary: Negative for dysuria.  Skin: Negative for rash.  All other systems reviewed and are negative.    Physical Exam Updated Vital Signs BP 143/94 (BP Location: Right Arm)   Pulse 76   Temp 98.2 F (36.8 C) (Oral)   Resp 18   Ht 5\' 9"  (1.753 m)   Wt 79.4 kg   SpO2 100%   BMI 25.84 kg/m   Physical Exam  Constitutional: He appears well-developed and well-nourished.  No distress.  HENT:  Head: Normocephalic and atraumatic.  Right Ear: External ear normal.  Left Ear: External ear normal.  Mouth/Throat: No oropharyngeal exudate.  Eyes: Conjunctivae are normal. Right eye exhibits no discharge. Left eye exhibits no discharge. No scleral icterus.  Neck: Neck supple. No tracheal deviation present.  Cardiovascular: Normal rate, regular rhythm and intact distal pulses.   Pulmonary/Chest: Effort normal and breath sounds normal. No stridor. No respiratory distress. He has no wheezes. He has no rales.  Abdominal: Soft. Bowel sounds are normal. He exhibits no distension. There is no tenderness. There is no rebound and no guarding.  Musculoskeletal: He exhibits no edema or tenderness.  Neurological: He is alert. He has normal strength. No cranial nerve deficit (no facial droop, extraocular movements intact, no slurred speech) or sensory deficit. He exhibits normal muscle tone. He displays no seizure activity. Coordination normal.  Skin: Skin is warm and dry. No rash noted.  Psychiatric: He has a normal mood and affect.  Nursing note and vitals reviewed.    ED Treatments / Results  Labs (all labs ordered are listed, but only abnormal results are displayed) Labs Reviewed  BASIC METABOLIC PANEL - Abnormal; Notable for the following:       Result Value   Potassium 3.4 (*)    Glucose, Bld 111 (*)    All other components within normal limits  CBC WITH DIFFERENTIAL/PLATELET - Abnormal; Notable for the following:    Monocytes Absolute 1.3 (*)    All other components within normal limits    Procedures Procedures (including critical care time)    Initial Impression / Assessment and Plan / ED Course  I have reviewed the triage vital signs and the nursing notes.  Pertinent labs & imaging results that were available during my care of the patient were reviewed by me and considered in my medical decision making (see chart for details).  Clinical Course    Patient's laboratory tests are reassuring. He is not febrile here. Physical exam does not suggest any focal evidence of infection.  No definite etiology for his symptoms at this point.  He has a history of recurrent CD4 count is above 300 and his viral load is undetectable according to the recent notes from his infectious disease physician.  I recommend the patient follow up with his doctor next week. I also suggested getting a thermometer to his temperature when he is having these hot flashes to see if he indeed is having fevers.  Final Clinical Impressions(s) / ED Diagnoses   Final diagnoses:  Hot flashes    New Prescriptions New Prescriptions   No medications on file     Linwood DibblesJon Olly Shiner, MD 01/20/16 1056

## 2016-01-20 NOTE — ED Notes (Signed)
Patient denies pain and is resting comfortably.  

## 2016-01-20 NOTE — Discharge Instructions (Signed)
Follow-up with your doctor next week as planned. Consider getting a thermometer seeking measure to see if her having any fevers at home. Continue your current medications. Return as needed for worsening symptoms.

## 2016-01-20 NOTE — ED Triage Notes (Addendum)
Pt reports fever/chills since last week; has been taking Tylenol at home; reports dizziness at times. Did not measure temp at home. Reports night sweats.

## 2016-01-24 ENCOUNTER — Ambulatory Visit: Payer: 59 | Admitting: Infectious Disease

## 2016-02-05 ENCOUNTER — Ambulatory Visit (INDEPENDENT_AMBULATORY_CARE_PROVIDER_SITE_OTHER): Payer: 59 | Admitting: Infectious Disease

## 2016-02-05 ENCOUNTER — Encounter: Payer: Self-pay | Admitting: Infectious Disease

## 2016-02-05 VITALS — BP 126/80 | HR 76 | Temp 98.2°F | Ht 69.0 in | Wt 165.0 lb

## 2016-02-05 DIAGNOSIS — B20 Human immunodeficiency virus [HIV] disease: Secondary | ICD-10-CM | POA: Diagnosis not present

## 2016-02-05 DIAGNOSIS — F410 Panic disorder [episodic paroxysmal anxiety] without agoraphobia: Secondary | ICD-10-CM | POA: Diagnosis not present

## 2016-02-05 DIAGNOSIS — Z23 Encounter for immunization: Secondary | ICD-10-CM | POA: Diagnosis not present

## 2016-02-05 NOTE — Progress Notes (Signed)
Chief complaint: followup for HIV, depression   Subjective:    Patient ID: Lance Craig, male    DOB: 1981-01-30, 35 y.o.   MRN: [id number]  HPI  Lance Craig is a 35 y.o. male with history of HIV/AIDS with PCP pneuonia in the past who had been doing superbly well on his  antiviral regimen, TRIUMEQ with undetectable viral load CD4 count now above 300.  Spring he suffered from a panic attack but is doing well since then and not having from with depression. His HIV is very well-controlled the syphilis is been completely cured.  Lab Results  Component Value Date   HIV1RNAQUANT 31 (H) 01/10/2016   HIV1RNAQUANT <20 09/06/2015   HIV1RNAQUANT <20 07/27/2014     Lab Results  Component Value Date   CD4TABS 480 01/10/2016   CD4TABS 420 09/06/2015   CD4TABS 270 (L) 07/27/2014    Past Medical History:  Diagnosis Date  . Anemia   . Anxiety   . Deep venous thrombosis (HCC)   . Depression   . Generalized anxiety disorder 08/10/2014  . HIV infection (HCC)   . Panic attack 08/10/2014  . PCP (pneumocystis jiroveci pneumonia) (HCC)   . Syphilis 09/20/2015    No past surgical history on file.  No family history on file.    Social History   Social History  . Marital status: Single    Spouse name: N/A  . Number of children: N/A  . Years of education: N/A   Social History Main Topics  . Smoking status: Never Smoker  . Smokeless tobacco: Never Used  . Alcohol use 0.6 oz/week    1 Standard drinks or equivalent per week     Comment: wine  . Drug use: No  . Sexual activity: Not Currently    Partners: Male     Comment: pt declined condoms   Other Topics Concern  . Not on file   Social History Narrative  . No narrative on file    Allergies  Allergen Reactions  . Zithromax [Azithromycin]     Developed a DVT and was told likely for this abx--this MAKES NO SENSE to me-- [name]     Current Outpatient Prescriptions:  .  acetaminophen (TYLENOL) 500 MG tablet, Take  500 mg by mouth every 6 (six) hours as needed for moderate pain., Disp: , Rfl:  .  loratadine (CLARITIN) 10 MG tablet, TAKE 1 TABLET BY MOUTH DAILY, Disp: 30 tablet, Rfl: 0 .  TRIUMEQ 600-50-300 MG tablet, TAKE 1 TABLET BY MOUTH DAILY, Disp: 30 tablet, Rfl: 3         Review of Systems  Constitutional: Negative for activity change, appetite change, chills, diaphoresis, fatigue, fever and unexpected weight change.  HENT: Negative for congestion, rhinorrhea, sinus pressure, sneezing, sore throat and trouble swallowing.   Eyes: Negative for photophobia and visual disturbance.  Respiratory: Negative for cough, chest tightness, shortness of breath, wheezing and stridor.   Cardiovascular: Negative for chest pain, palpitations and leg swelling.  Gastrointestinal: Negative for abdominal distention, abdominal pain, anal bleeding, blood in stool, constipation, diarrhea, nausea and vomiting.  Genitourinary: Negative for difficulty urinating, dysuria, flank pain and hematuria.  Musculoskeletal: Negative for arthralgias, gait problem, joint swelling and myalgias.  Skin: Negative for color change, pallor, rash and wound.  Neurological: Negative for dizziness, tremors, weakness and light-headedness.  Hematological: Negative for adenopathy. Does not bruise/bleed easily.  Psychiatric/Behavioral: Negative for behavioral problems, confusion, decreased concentration, dysphoric mood and sleep disturbance.  Objective:   Physical Exam  Constitutional: He is oriented to person, place, and time. He appears well-developed and well-nourished. No distress.  HENT:  Head: Normocephalic and atraumatic.  Mouth/Throat: Oropharynx is clear and moist. No oropharyngeal exudate.  Eyes: Conjunctivae and EOM are normal. Pupils are equal, round, and reactive to light. No scleral icterus.  Neck: Normal range of motion. Neck supple. No JVD present.  Cardiovascular: Normal rate and regular rhythm.   Pulmonary/Chest:  Effort normal. No respiratory distress. He has no wheezes.  Abdominal: He exhibits no distension.  Musculoskeletal: He exhibits no edema or tenderness.  Lymphadenopathy:    He has no cervical adenopathy.  Neurological: He is alert and oriented to person, place, and time. He has normal reflexes. He exhibits normal muscle tone. Coordination normal.  Skin: Skin is warm and dry. He is not diaphoretic. No erythema. No pallor.  Psychiatric: His behavior is normal. Judgment and thought content normal. His mood appears anxious. He exhibits a depressed mood.          Assessment & Plan:    #1 HIV disease with hx of AIDS: Continue TRIUMEQ , Check labs and see him in 6 months time.  #2 generalized anxiety with panic attack in May. Now doing well.

## 2016-02-20 ENCOUNTER — Other Ambulatory Visit: Payer: Self-pay | Admitting: Infectious Disease

## 2016-02-20 DIAGNOSIS — B20 Human immunodeficiency virus [HIV] disease: Secondary | ICD-10-CM

## 2016-07-24 ENCOUNTER — Other Ambulatory Visit: Payer: 59

## 2016-07-24 DIAGNOSIS — B20 Human immunodeficiency virus [HIV] disease: Secondary | ICD-10-CM

## 2016-07-24 LAB — CBC WITH DIFFERENTIAL/PLATELET
Basophils Absolute: 42 cells/uL (ref 0–200)
Basophils Relative: 1 %
EOS ABS: 126 {cells}/uL (ref 15–500)
Eosinophils Relative: 3 %
HEMATOCRIT: 41.4 % (ref 38.5–50.0)
Hemoglobin: 13.7 g/dL (ref 13.2–17.1)
LYMPHS PCT: 55 %
Lymphs Abs: 2310 cells/uL (ref 850–3900)
MCH: 29.5 pg (ref 27.0–33.0)
MCHC: 33.1 g/dL (ref 32.0–36.0)
MCV: 89.2 fL (ref 80.0–100.0)
MONO ABS: 462 {cells}/uL (ref 200–950)
MONOS PCT: 11 %
MPV: 9.2 fL (ref 7.5–12.5)
NEUTROS PCT: 30 %
Neutro Abs: 1260 cells/uL — ABNORMAL LOW (ref 1500–7800)
PLATELETS: 230 10*3/uL (ref 140–400)
RBC: 4.64 MIL/uL (ref 4.20–5.80)
RDW: 14.8 % (ref 11.0–15.0)
WBC: 4.2 10*3/uL (ref 3.8–10.8)

## 2016-07-24 LAB — LIPID PANEL
CHOL/HDL RATIO: 2.7 ratio (ref <5.0)
CHOLESTEROL: 149 mg/dL (ref <200)
HDL: 56 mg/dL (ref >40)
LDL Cholesterol: 77 mg/dL (ref <100)
TRIGLYCERIDES: 80 mg/dL (ref <150)
VLDL: 16 mg/dL (ref <30)

## 2016-07-24 LAB — COMPLETE METABOLIC PANEL WITH GFR
ALT: 11 U/L (ref 9–46)
AST: 17 U/L (ref 10–40)
Albumin: 4.3 g/dL (ref 3.6–5.1)
Alkaline Phosphatase: 79 U/L (ref 40–115)
BUN: 14 mg/dL (ref 7–25)
CALCIUM: 9.2 mg/dL (ref 8.6–10.3)
CHLORIDE: 107 mmol/L (ref 98–110)
CO2: 19 mmol/L — ABNORMAL LOW (ref 20–31)
Creat: 1.22 mg/dL (ref 0.60–1.35)
GFR, EST AFRICAN AMERICAN: 88 mL/min (ref >=60)
GFR, EST NON AFRICAN AMERICAN: 76 mL/min (ref >=60)
Glucose, Bld: 88 mg/dL (ref 65–99)
POTASSIUM: 4.1 mmol/L (ref 3.5–5.3)
Sodium: 140 mmol/L (ref 135–146)
Total Bilirubin: 0.3 mg/dL (ref 0.2–1.2)
Total Protein: 7.1 g/dL (ref 6.1–8.1)

## 2016-07-25 LAB — T-HELPER CELL (CD4) - (RCID CLINIC ONLY)
CD4 % Helper T Cell: 20 % — ABNORMAL LOW (ref 33–55)
CD4 T CELL ABS: 510 /uL (ref 400–2700)

## 2016-07-25 LAB — RPR

## 2016-07-26 LAB — HIV-1 RNA QUANT-NO REFLEX-BLD
HIV 1 RNA QUANT: NOT DETECTED {copies}/mL
HIV-1 RNA Quant, Log: <1.3 Log copies/mL

## 2016-08-07 ENCOUNTER — Ambulatory Visit (INDEPENDENT_AMBULATORY_CARE_PROVIDER_SITE_OTHER): Payer: 59 | Admitting: Infectious Disease

## 2016-08-07 ENCOUNTER — Encounter: Payer: Self-pay | Admitting: Infectious Disease

## 2016-08-07 VITALS — BP 143/86 | HR 61 | Temp 97.8°F | Ht 69.0 in | Wt 167.0 lb

## 2016-08-07 DIAGNOSIS — J3089 Other allergic rhinitis: Secondary | ICD-10-CM

## 2016-08-07 DIAGNOSIS — A630 Anogenital (venereal) warts: Secondary | ICD-10-CM

## 2016-08-07 DIAGNOSIS — K644 Residual hemorrhoidal skin tags: Secondary | ICD-10-CM

## 2016-08-07 DIAGNOSIS — K648 Other hemorrhoids: Secondary | ICD-10-CM

## 2016-08-07 DIAGNOSIS — B20 Human immunodeficiency virus [HIV] disease: Secondary | ICD-10-CM

## 2016-08-07 DIAGNOSIS — F0630 Mood disorder due to known physiological condition, unspecified: Secondary | ICD-10-CM | POA: Diagnosis not present

## 2016-08-07 DIAGNOSIS — Z23 Encounter for immunization: Secondary | ICD-10-CM | POA: Diagnosis not present

## 2016-08-07 DIAGNOSIS — K649 Unspecified hemorrhoids: Secondary | ICD-10-CM | POA: Insufficient documentation

## 2016-08-07 HISTORY — DX: Residual hemorrhoidal skin tags: K64.4

## 2016-08-07 HISTORY — DX: Other hemorrhoids: K64.8

## 2016-08-07 HISTORY — DX: Anogenital (venereal) warts: A63.0

## 2016-08-07 LAB — BASIC METABOLIC PANEL WITH GFR
BUN: 12 mg/dL (ref 7–25)
CHLORIDE: 106 mmol/L (ref 98–110)
CO2: 24 mmol/L (ref 20–31)
CREATININE: 1.21 mg/dL (ref 0.60–1.35)
Calcium: 9.3 mg/dL (ref 8.6–10.3)
GFR, Est African American: 89 mL/min (ref >=60)
GFR, Est Non African American: 77 mL/min (ref >=60)
Glucose, Bld: 89 mg/dL (ref 65–99)
Potassium: 3.9 mmol/L (ref 3.5–5.3)
Sodium: 140 mmol/L (ref 135–146)

## 2016-08-07 MED ORDER — ABACAVIR-DOLUTEGRAVIR-LAMIVUD 600-50-300 MG PO TABS: 1.0000 | ORAL_TABLET | Freq: Every day | ORAL | 11 refills | Status: DC

## 2016-08-07 NOTE — Progress Notes (Signed)
Chief complaint: concern for hemorrhoid, also wants to know if he can take "horse hair" to help his hair grow  Subjective:    Patient ID: Lance Craig, male    DOB: 11/03/1980, 36 y.o.   MRN: [id number]  HPI  Lance Craig is a 36 y.o. male with history of HIV/AIDS with PCP pneuonia in the past who had been doing superbly well on his  antiviral regimen, TRIUMEQ with undetectable viral load CD4 count now above 400  He has what he believes is hemorrhoid and is concerned about this.  Lab Results  Component Value Date   HIV1RNAQUANT <20 NOT DETECTED 07/24/2016   HIV1RNAQUANT 31 (H) 01/10/2016   HIV1RNAQUANT <20 09/06/2015     Lab Results  Component Value Date   CD4TABS 510 07/24/2016   CD4TABS 480 01/10/2016   CD4TABS 420 09/06/2015    Past Medical History:  Diagnosis Date  . Anemia   . Anxiety   . Deep venous thrombosis (HCC)   . Depression   . Generalized anxiety disorder 08/10/2014  . HIV infection (HCC)   . Panic attack 08/10/2014  . PCP (pneumocystis jiroveci pneumonia) (HCC)   . Syphilis 09/20/2015    No past surgical history on file.  No family history on file.    Social History   Social History  . Marital status: Single    Spouse name: N/A  . Number of children: N/A  . Years of education: N/A   Social History Main Topics  . Smoking status: Never Smoker  . Smokeless tobacco: Never Used  . Alcohol use 0.6 oz/week    1 Standard drinks or equivalent per week     Comment: wine  . Drug use: No  . Sexual activity: Not Currently    Partners: Male     Comment: pt declined condoms   Other Topics Concern  . Not on file   Social History Narrative  . No narrative on file    Allergies  Allergen Reactions  . Zithromax [Azithromycin]     Developed a DVT and was told likely for this abx--this MAKES NO SENSE to me-- Lance Craig     Current Outpatient Prescriptions:  .  acetaminophen (TYLENOL) 500 MG tablet, Take 500 mg by mouth every 6 (six) hours as  needed for moderate pain., Disp: , Rfl:  .  loratadine (CLARITIN) 10 MG tablet, TAKE 1 TABLET BY MOUTH DAILY, Disp: 30 tablet, Rfl: 0 .  TRIUMEQ 600-50-300 MG tablet, TAKE 1 TABLET BY MOUTH DAILY, Disp: 30 tablet, Rfl: 5         Review of Systems  Constitutional: Negative for activity change, appetite change, chills, diaphoresis, fatigue, fever and unexpected weight change.  HENT: Negative for congestion, rhinorrhea, sinus pressure, sneezing, sore throat and trouble swallowing.   Eyes: Negative for photophobia and visual disturbance.  Respiratory: Negative for cough, chest tightness, shortness of breath, wheezing and stridor.   Cardiovascular: Negative for chest pain, palpitations and leg swelling.  Gastrointestinal: Negative for abdominal distention, abdominal pain, anal bleeding, blood in stool, constipation, diarrhea, nausea, rectal pain and vomiting.  Genitourinary: Negative for difficulty urinating, dysuria, flank pain and hematuria.  Musculoskeletal: Negative for arthralgias, gait problem, joint swelling and myalgias.  Skin: Negative for color change, pallor, rash and wound.  Neurological: Negative for dizziness, tremors, weakness and light-headedness.  Hematological: Negative for adenopathy. Does not bruise/bleed easily.  Psychiatric/Behavioral: Negative for behavioral problems, confusion, decreased concentration, dysphoric mood and sleep disturbance.  Objective:   Physical Exam  Constitutional: He is oriented to person, place, and time. He appears well-developed and well-nourished. No distress.  HENT:  Head: Normocephalic and atraumatic.  Mouth/Throat: Oropharynx is clear and moist. No oropharyngeal exudate.  Eyes: Conjunctivae and EOM are normal. Pupils are equal, round, and reactive to light. No scleral icterus.  Neck: Normal range of motion. Neck supple. No JVD present.  Cardiovascular: Normal rate and regular rhythm.   Pulmonary/Chest: Effort normal. No respiratory  distress. He has no wheezes.  Abdominal: He exhibits no distension.  Genitourinary:     Musculoskeletal: He exhibits no edema or tenderness.  Lymphadenopathy:    He has no cervical adenopathy.  Neurological: He is alert and oriented to person, place, and time. He has normal reflexes. He exhibits normal muscle tone. Coordination normal.  Skin: Skin is warm and dry. He is not diaphoretic. No erythema. No pallor.  Psychiatric: His behavior is normal. Judgment and thought content normal. His mood appears anxious. He exhibits a depressed mood.          Assessment & Plan:    #1 HIV disease with hx of AIDS: Continue TRIUMEQ I wanted to fill via [facility] and he was willing but working 3rd shift IS NOT conventient for him given that [facility] not open 24 hrs  RTC in 6 months time.  #2 Hemorroid +/- anal wart: refer for HRA here with Dr. Ninetta Lights vs [place] study    I spent greater than 25 minutes with the patient including greater than 50% of time in face to face counsel of the patient re his ARV regimen, need for vaccines, checking supplement and in counsel re hemorrhoid and possible HPV infection  and in coordination of their care.

## 2016-08-22 ENCOUNTER — Other Ambulatory Visit: Payer: Self-pay | Admitting: Infectious Disease

## 2016-08-22 DIAGNOSIS — B20 Human immunodeficiency virus [HIV] disease: Secondary | ICD-10-CM

## 2016-09-06 ENCOUNTER — Ambulatory Visit (HOSPITAL_COMMUNITY)
Admission: EM | Admit: 2016-09-06 | Discharge: 2016-09-06 | Disposition: A | Discharge status: Home / Self Care | Payer: 59 | Attending: Family Medicine | Admitting: Family Medicine

## 2016-09-06 ENCOUNTER — Telehealth: Payer: Self-pay | Admitting: *Deleted

## 2016-09-06 ENCOUNTER — Encounter (HOSPITAL_COMMUNITY): Payer: Self-pay | Admitting: Emergency Medicine

## 2016-09-06 DIAGNOSIS — T148XXA Other injury of unspecified body region, initial encounter: Secondary | ICD-10-CM

## 2016-09-06 MED ORDER — MELOXICAM 7.5 MG PO TABS: 7.5000 mg | ORAL_TABLET | Freq: Every day | ORAL | 0 refills | Status: DC

## 2016-09-06 NOTE — ED Triage Notes (Signed)
Pt here b/c of LLE concern.... Reports hx of DVT in 2011  Pt unable to explain sx but feels "something wrong" that causes him to limp  Denies pain  Reports he works 3rd shift at the [employer] and is constantly on his feet  A&O x4... NAD... Ambulatory

## 2016-09-06 NOTE — Telephone Encounter (Signed)
Patient called, in interested in engaging a PCP, especially since he has had a blood clot in the past. He asked if we could refer him anywhere specifically. RN advised patient to start with a provider list on his Insurance's website. Patient will call back if he has any questions.  In the mean time, as he is worried about his history of a clot, he will seek care at an Urgent Care if he develops symptoms.                                Andree CossHowell, Nygeria Lager M, RN

## 2016-09-06 NOTE — Discharge Instructions (Signed)
Physical exam does not support the diagnosis of DVT. Instead, your symptoms are much more consistent with a muscle strain.  Take the medicine as prescribed once a day and if your symptoms persist into next week, please return and will arrange to have a ultrasound to rule out any small clot.

## 2016-09-06 NOTE — ED Provider Notes (Signed)
[facility]    CSN: [id number] Arrival date & time: 09/06/16  1716     History   Chief Complaint Chief Complaint  Patient presents with  . Leg Problem    HPI Lance Craig is a 36 y.o. male.   Pt here b/c of LLE concern.... Reports hx of DVT in 2011  Pt unable to explain sx but feels "something wrong" that causes him to limp  Denies pain  Reports he works 3rd shift at the [employer] and is constantly on his feet  A&O x4... NAD... Ambulatory       Past Medical History:  Diagnosis Date  . Anal wart 08/07/2016  . Anemia   . Anxiety   . Deep venous thrombosis (HCC)   . Depression   . External hemorrhoid 08/07/2016  . Generalized anxiety disorder 08/10/2014  . HIV infection (HCC)   . Internal hemorrhoid 08/07/2016  . Panic attack 08/10/2014  . PCP (pneumocystis jiroveci pneumonia) (HCC)   . Syphilis 09/20/2015    Patient Active Problem List   Diagnosis Date Noted  . Anal wart 08/07/2016  . Internal hemorrhoid 08/07/2016  . External hemorrhoid 08/07/2016  . Syphilis 09/20/2015  . Panic attack 08/10/2014  . Generalized anxiety disorder 08/10/2014  . PCP (pneumocystis jiroveci pneumonia) (HCC)   . Lumbago 10/14/2012  . Deep venous thrombosis (HCC)   . Weight gain 07/10/2011  . Seasonal allergies 06/28/2011  . Joint pain 02/21/2011  . Folic acid deficiency 07/31/2010  . Long term current use of anticoagulant 04/07/2010  . AIDS (HCC) 01/01/2010  . PNEUMOCYSTIS PNEUMONIA 01/01/2010  . 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 DVT 12/22/2009  . PE 12/16/2009    History reviewed. No pertinent surgical history.     Home Medications    Prior to Admission medications   Medication Sig Start Date End Date Taking? Authorizing Provider  abacavir-dolutegravir-lamiVUDine (TRIUMEQ) 600-50-300 MG tablet Take 1 tablet by mouth daily. 08/07/16  Yes Daiva EvesVan Dam, Lisette Grinderornelius N, MD  acetaminophen (TYLENOL) 500 MG tablet Take 500 mg by  mouth every 6 (six) hours as needed for moderate pain.    [provider]  loratadine (CLARITIN) 10 MG tablet TAKE 1 TABLET BY MOUTH DAILY 08/31/12   Cliffton Astersampbell, John, MD  meloxicam (MOBIC) 7.5 MG tablet Take 1 tablet (7.5 mg total) by mouth daily. 09/06/16   Elvina SidleLauenstein, Zenora Karpel, MD    Family History History reviewed. No pertinent family history.  Social History Social History  Substance Use Topics  . Smoking status: Never Smoker  . Smokeless tobacco: Never Used  . Alcohol use 0.6 oz/week    1 Standard drinks or equivalent per week     Comment: wine     Allergies   Zithromax [azithromycin]   Review of Systems Review of Systems  All other systems reviewed and are negative.    Physical Exam Triage Vital Signs ED Triage Vitals [09/06/16 1739]  Enc Vitals Group     BP 134/89     Pulse Rate (!) 58     Resp 16     Temp 98.8 F (37.1 C)     Temp Source Oral     SpO2 98 %     Weight      Height      Head Circumference      Peak Flow      Pain Score      Pain Loc      Pain Edu?  Excl. in GC?    No data found.   Updated Vital Signs BP 134/89 (BP Location: Left Arm)   Pulse (!) 58   Temp 98.8 F (37.1 C) (Oral)   Resp 16   SpO2 98%       Physical Exam  Constitutional: He is oriented to person, place, and time. He appears well-developed and well-nourished.  HENT:  Right Ear: External ear normal.  Left Ear: External ear normal.  Mouth/Throat: Oropharynx is clear and moist.  Eyes: Conjunctivae are normal. Pupils are equal, round, and reactive to light.  Neck: Normal range of motion. Neck supple.  Pulmonary/Chest: Effort normal.  Musculoskeletal: Normal range of motion.  Neurological: He is alert and oriented to person, place, and time.  Skin: Skin is warm and dry.  Nursing note and vitals reviewed.    UC Treatments / Results  Labs (all labs ordered are listed, but only abnormal results are displayed) Labs Reviewed - No data to display  EKG   EKG Interpretation None       Radiology No results found.  Procedures Procedures (including critical care time)  Medications Ordered in UC Medications - No data to display   Initial Impression / Assessment and Plan / UC Course  I have reviewed the triage vital signs and the nursing notes.  Pertinent labs & imaging results that were available during my care of the patient were reviewed by me and considered in my medical decision making (see chart for details).     Final Clinical Impressions(s) / UC Diagnoses   Final diagnoses:  Muscle strain    New Prescriptions New Prescriptions   MELOXICAM (MOBIC) 7.5 MG TABLET    Take 1 tablet (7.5 mg total) by mouth daily.     Elvina Sidle, MD 09/06/16 1758

## 2016-09-10 ENCOUNTER — Encounter: Payer: Self-pay | Admitting: Family Medicine

## 2016-09-10 ENCOUNTER — Telehealth: Payer: Self-pay | Admitting: Family Medicine

## 2016-09-10 ENCOUNTER — Ambulatory Visit (INDEPENDENT_AMBULATORY_CARE_PROVIDER_SITE_OTHER): Payer: 59 | Admitting: Family Medicine

## 2016-09-10 ENCOUNTER — Other Ambulatory Visit: Payer: Self-pay

## 2016-09-10 ENCOUNTER — Encounter (HOSPITAL_COMMUNITY): Payer: 59

## 2016-09-10 VITALS — BP 118/80 | HR 62 | Resp 12 | Ht 69.0 in | Wt 165.5 lb

## 2016-09-10 DIAGNOSIS — M79605 Pain in left leg: Secondary | ICD-10-CM

## 2016-09-10 DIAGNOSIS — J302 Other seasonal allergic rhinitis: Secondary | ICD-10-CM | POA: Diagnosis not present

## 2016-09-10 NOTE — Progress Notes (Signed)
HPI:   Lance Craig is a 36 y.o. male, who is here today to establish care.  Former PCP: N/A Last preventive routine visit: Many years ago.  Chronic medical problems: AIDS, HLD, seasonal allergies, depression, and anxiety among some.  2-3 times per year he sees Dr Daiva EvesVan Craig, Dx 2011.  Concerns today: Worried about another DVT.  "Problems with leg" C/O intermittent limping ,mainly at the end of the day. He can not described symptom, "weird feeling" on whole distal LLE for about a week.  It happens usually when "moving certain way." He states that he can still feel discomfort when he is in bed. He has had the same job for 5 years, which requires prolonged walking, he wonders if this is work related and if he needs to be out of work to rest leg. Exacerbated by prolonged walking/standing. Alleviated by LE elevation.  He states that he is not pain.  He is not sure about edema, no erythema or skin rash.  DVT x 1 in 2011, he states that since DVT his LLE has been bigger than his right one. He denies prolonged immobilization or long driving/flying or hx of trauma. He has not tried any OTC treatment.  He was evaluated in the ER recently for same problem, 09/06/16. He was Dx with muscle strain.  Noted rhinorrhea and nasal congestion. Hx of allergic rhinitis. He takes OTC Claritin. He has not noted fever, chills, sore throat, cough, wheezing, or dyspnea.  No sick contact.  Review of Systems  Constitutional: Negative for activity change, appetite change, fatigue, fever and unexpected weight change.  HENT: Positive for congestion, postnasal drip and rhinorrhea. Negative for ear pain, mouth sores, nosebleeds, sinus pain, sore throat and trouble swallowing.   Eyes: Negative for redness and visual disturbance.  Respiratory: Negative for cough, shortness of breath and wheezing.   Cardiovascular: Negative for chest pain and palpitations.  Gastrointestinal: Negative for abdominal  pain, blood in stool, nausea and vomiting.  Genitourinary: Negative for decreased urine volume, dysuria and hematuria.  Musculoskeletal: Positive for myalgias. Negative for gait problem.  Skin: Negative for color change, rash and wound.  Allergic/Immunologic: Positive for environmental allergies.  Neurological: Negative for syncope, weakness, numbness and headaches.  Hematological: Negative for adenopathy. Does not bruise/bleed easily.  Psychiatric/Behavioral: Negative for confusion. The patient is nervous/anxious.       Current Outpatient Prescriptions on File Prior to Visit  Medication Sig Dispense Refill  . abacavir-dolutegravir-lamiVUDine (TRIUMEQ) 600-50-300 MG tablet Take 1 tablet by mouth daily. 30 tablet 11   No current facility-administered medications on file prior to visit.      Past Medical History:  Diagnosis Date  . Anal wart 08/07/2016  . Anemia   . Anxiety   . Deep venous thrombosis (HCC)   . Depression   . External hemorrhoid 08/07/2016  . Generalized anxiety disorder 08/10/2014  . HIV infection (HCC)   . Internal hemorrhoid 08/07/2016  . Panic attack 08/10/2014  . PCP (pneumocystis jiroveci pneumonia) (HCC)   . Syphilis 09/20/2015   Allergies  Allergen Reactions  . Zithromax [Azithromycin]     Developed a DVT and was told likely for this abx--this MAKES NO SENSE to me-- Lance Craig    Family History  Problem Relation Age of Onset  . Kidney disease Maternal Grandmother   . Cancer Neg Hx   . Diabetes Neg Hx     Social History   Social History  . Marital status: Single  Spouse name: N/A  . Number of children: N/A  . Years of education: N/A   Social History Main Topics  . Smoking status: Never Smoker  . Smokeless tobacco: Never Used  . Alcohol use 0.6 oz/week    1 Standard drinks or equivalent per week     Comment: wine  . Drug use: No  . Sexual activity: Not Currently    Partners: Male     Comment: pt declined condoms   Other Topics  Concern  . None   Social History Narrative  . None    Vitals:   09/10/16 0906  BP: 118/80  Pulse: 62  Resp: 12   O2 sat at RA 98% Body mass index is 24.44 kg/m.  Physical Exam  Nursing note and vitals reviewed. Constitutional: He is oriented to person, place, and time. He appears well-developed and well-nourished. No distress.  HENT:  Head: Atraumatic.  Nose: Rhinorrhea present. Right sinus exhibits no maxillary sinus tenderness and no frontal sinus tenderness. Left sinus exhibits no maxillary sinus tenderness and no frontal sinus tenderness.  Mouth/Throat: Oropharynx is clear and moist and mucous membranes are normal.  Post nasal drainage.  Eyes: Conjunctivae and EOM are normal. Pupils are equal, round, and reactive to light.  Cardiovascular: Normal rate and regular rhythm.   No murmur heard. Pulses:      Dorsalis pedis pulses are 2+ on the right side, and 2+ on the left side.  Respiratory: Effort normal and breath sounds normal. No respiratory distress.  GI: Soft. He exhibits no mass. There is no hepatomegaly. There is no tenderness.  Musculoskeletal: He exhibits no edema or tenderness.       Left lower leg: He exhibits no tenderness.  No tenderness, left calf moderately bigger then right, no erythema or edema. Holman's test negative. Minimal limitation ankle dorsal flexion, left.   Lymphadenopathy:    He has no cervical adenopathy.  Neurological: He is alert and oriented to person, place, and time. He has normal strength. Gait normal.  Skin: Skin is warm. No rash noted. No erythema.  Psychiatric: His mood appears anxious. Cognition and memory are normal.  Appropriately groomed, good eye contact.     ASSESSMENT AND PLAN:   Akeen was seen today for establish care.  Diagnoses and all orders for this visit:  Pain of left lower extremity  Poor historian. We discussed possible etiologies. Clinical findings do not suggest an acute DVT. ? Muscle strain, venous  insufficiency, radicular pain among some to consider. Compression stocking may help. Topical OTC products as Icy hot may also help. Because his concerns about possible DVT + prior Hx, LLE [place] will be arranged.   LLE [place] was arranged for tomorrow. Clearly instructed about warning signs.  -    VAS [place] LOWER EXTREMITY VENOUS (DVT); Future  Seasonal allergic rhinitis, unspecified trigger  Nasal irrigation with saline as needed. He can try OTC Zyrtec or Allegra instead Claritin. Intra nasal steroids may also help.   Appt for [place] was arranged for tomorrow but I was informed he refused appt. I offered a letter for work , so he can go a have test done but I was told that he is not working currently, he has some other "things" he needs to do tomorrow and would like appt to be re-arranged for another day. He expressed concerned about a new DVT, he was educated about risks if in fact symptoms are related to DVT.   30 min face to face OV. >  50% was dedicated to discussion of differential Dx, prognosis, treatment options, plan of care , as well as counseling about some symptoms for which he needs to monitor that may suggest complications.     Betty G. [place], MD  [facility]. [place] office.

## 2016-09-10 NOTE — Telephone Encounter (Signed)
Pt was scheduled for a VAS LOWER EXTREMITY VENOUS  Need it ASAP pt was scheduled for 09/10/2016 @2 :30 pm  Called pt to inform states he could not go he had another appt , pt decline appt informed Dr SwazilandJordan

## 2016-09-10 NOTE — Patient Instructions (Signed)
A few things to remember from today's visit:   Pain of left lower extremity - Plan: VAS US LOWER EXTREMITY VENOUS (DVT)   Vein disease is a condition that can affect the veins in the legs. It can cause leg pain, varicose veins, swollen legs, or open sores. Varicose veins are swollen and twisted veins. Things that may help: leg exercises (ankle flexion, walking),compression stocking.   Compression stockings- [employer] in [place] or any medical supply in the area.  Please be sure medication list is accurate. If a new problem present, please set up appointment sooner than planned today.

## 2016-09-13 ENCOUNTER — Ambulatory Visit (HOSPITAL_COMMUNITY)
Admission: RE | Admit: 2016-09-13 | Discharge: 2016-09-13 | Disposition: A | Discharge status: Home / Self Care | Payer: 59 | Source: Ambulatory Visit | Attending: Cardiology | Admitting: Cardiology

## 2016-09-13 DIAGNOSIS — M79605 Pain in left leg: Secondary | ICD-10-CM | POA: Diagnosis not present

## 2016-09-14 ENCOUNTER — Encounter: Payer: Self-pay | Admitting: Family Medicine

## 2016-09-16 NOTE — Progress Notes (Signed)
[facility]!

## 2016-12-05 ENCOUNTER — Encounter: Payer: Self-pay | Admitting: Family Medicine

## 2016-12-20 ENCOUNTER — Telehealth: Payer: Self-pay

## 2016-12-20 NOTE — Telephone Encounter (Signed)
Attempted to call Lance Craig to schedule an Anal Pap per Dr. Daiva Eves. Was unable to reach [name] I left a brief message and instructed the pt to call back to schedule the appt.  Lorenso Courier, [place]

## 2017-01-27 ENCOUNTER — Other Ambulatory Visit: Payer: 59

## 2017-01-27 DIAGNOSIS — B20 Human immunodeficiency virus [HIV] disease: Secondary | ICD-10-CM

## 2017-01-28 LAB — CBC WITH DIFFERENTIAL/PLATELET
BASOS PCT: 1.1 %
Basophils Absolute: 51 cells/uL (ref 0–200)
EOS ABS: 290 {cells}/uL (ref 15–500)
EOS PCT: 6.3 %
HCT: 39.7 % (ref 38.5–50.0)
HEMOGLOBIN: 13.5 g/dL (ref 13.2–17.1)
Lymphs Abs: 2222 cells/uL (ref 850–3900)
MCH: 29.9 pg (ref 27.0–33.0)
MCHC: 34 g/dL (ref 32.0–36.0)
MCV: 88 fL (ref 80.0–100.0)
MONOS PCT: 11.3 %
MPV: 9.8 fL (ref 7.5–12.5)
NEUTROS ABS: 1518 {cells}/uL (ref 1500–7800)
Neutrophils Relative %: 33 %
Platelets: 247 10*3/uL (ref 140–400)
RBC: 4.51 10*6/uL (ref 4.20–5.80)
RDW: 12.7 % (ref 11.0–15.0)
TOTAL LYMPHOCYTE: 48.3 %
WBC mixed population: 520 cells/uL (ref 200–950)
WBC: 4.6 10*3/uL (ref 3.8–10.8)

## 2017-01-28 LAB — COMPLETE METABOLIC PANEL WITH GFR
AG Ratio: 1.8 (calc) (ref 1.0–2.5)
ALT: 11 U/L (ref 9–46)
AST: 18 U/L (ref 10–40)
Albumin: 4.3 g/dL (ref 3.6–5.1)
Alkaline phosphatase (APISO): 82 U/L (ref 40–115)
BILIRUBIN TOTAL: 0.4 mg/dL (ref 0.2–1.2)
BUN/Creatinine Ratio: 12 (calc) (ref 6–22)
BUN: 17 mg/dL (ref 7–25)
CHLORIDE: 107 mmol/L (ref 98–110)
CO2: 26 mmol/L (ref 20–32)
CREATININE: 1.38 mg/dL — AB (ref 0.60–1.35)
Calcium: 9.3 mg/dL (ref 8.6–10.3)
GFR, EST AFRICAN AMERICAN: 76 mL/min/{1.73_m2} (ref >=60)
GFR, Est Non African American: 66 mL/min/{1.73_m2} (ref >=60)
GLUCOSE: 78 mg/dL (ref 65–99)
Globulin: 2.4 g/dL (calc) (ref 1.9–3.7)
Potassium: 3.9 mmol/L (ref 3.5–5.3)
Sodium: 140 mmol/L (ref 135–146)
TOTAL PROTEIN: 6.7 g/dL (ref 6.1–8.1)

## 2017-01-28 LAB — T-HELPER CELL (CD4) - (RCID CLINIC ONLY)
CD4 % Helper T Cell: 22 % — ABNORMAL LOW (ref 33–55)
CD4 T Cell Abs: 570 /uL (ref 400–2700)

## 2017-01-28 LAB — RPR: RPR: NONREACTIVE

## 2017-01-29 LAB — HIV-1 RNA QUANT-NO REFLEX-BLD
HIV 1 RNA QUANT: 31 {copies}/mL — AB
HIV-1 RNA QUANT, LOG: 1.49 {Log_copies}/mL — AB

## 2017-02-10 ENCOUNTER — Encounter: Payer: Self-pay | Admitting: Infectious Disease

## 2017-02-10 ENCOUNTER — Ambulatory Visit (INDEPENDENT_AMBULATORY_CARE_PROVIDER_SITE_OTHER): Payer: 59 | Admitting: Infectious Disease

## 2017-02-10 VITALS — BP 137/91 | HR 73 | Temp 98.5°F | Ht 69.0 in | Wt 177.0 lb

## 2017-02-10 DIAGNOSIS — Z113 Encounter for screening for infections with a predominantly sexual mode of transmission: Secondary | ICD-10-CM | POA: Diagnosis not present

## 2017-02-10 DIAGNOSIS — N289 Disorder of kidney and ureter, unspecified: Secondary | ICD-10-CM | POA: Diagnosis not present

## 2017-02-10 DIAGNOSIS — Z23 Encounter for immunization: Secondary | ICD-10-CM | POA: Diagnosis not present

## 2017-02-10 DIAGNOSIS — B20 Human immunodeficiency virus [HIV] disease: Secondary | ICD-10-CM

## 2017-02-10 DIAGNOSIS — Z79899 Other long term (current) drug therapy: Secondary | ICD-10-CM

## 2017-02-10 DIAGNOSIS — A630 Anogenital (venereal) warts: Secondary | ICD-10-CM

## 2017-02-10 HISTORY — DX: Disorder of kidney and ureter, unspecified: N28.9

## 2017-02-10 LAB — BASIC METABOLIC PANEL WITH GFR
BUN: 13 mg/dL (ref 7–25)
CALCIUM: 9.3 mg/dL (ref 8.6–10.3)
CO2: 28 mmol/L (ref 20–32)
Chloride: 107 mmol/L (ref 98–110)
Creat: 1.07 mg/dL (ref 0.60–1.35)
GFR, EST AFRICAN AMERICAN: 103 mL/min/{1.73_m2} (ref >=60)
GFR, EST NON AFRICAN AMERICAN: 89 mL/min/{1.73_m2} (ref >=60)
Glucose, Bld: 97 mg/dL (ref 65–99)
POTASSIUM: 3.5 mmol/L (ref 3.5–5.3)
Sodium: 140 mmol/L (ref 135–146)

## 2017-02-10 NOTE — Patient Instructions (Signed)
We need to make an appt with Dr. Ninetta LightsHatcher for HRA clinic (make appt today if possible)  We will check blood and urine to investigate kidney function  If repeat labs reassuring then rtc with [facility] in 6 months

## 2017-02-10 NOTE — Progress Notes (Signed)
Chief complaint: followup for HIV with one up tick in serum creatinine Subjective:    Patient ID: Lance Craig, male    DOB: April 02, 1980, 36 y.o.   MRN: [id number]  HPI  Lance Craig is a 36 y.o. male with history of HIV/AIDS with PCP pneuonia in the past who had been doing superbly well on his  antiviral regimen, TRIUMEQ with undetectable viral load CD4 count now above 400  At last visit we were concerned about whether he might have genital warts and I had wanted him to be [facility] but this was not scheduled.  I reviewed his labs with Samuel BoucheLucas and his VL of 31 is NCS and undoubtedly a "blip." His creatinine is up and he denies using NSAIDS much recently or dehydration.   Lab Results  Component Value Date   HIV1RNAQUANT 31 (H) 01/27/2017   HIV1RNAQUANT <20 NOT DETECTED 07/24/2016   HIV1RNAQUANT 31 (H) 01/10/2016     Lab Results  Component Value Date   CD4TABS 570 01/27/2017   CD4TABS 510 07/24/2016   CD4TABS 480 01/10/2016    Past Medical History:  Diagnosis Date  . Anal wart 08/07/2016  . Anemia   . Anxiety   . Deep venous thrombosis (HCC)   . Depression   . External hemorrhoid 08/07/2016  . Generalized anxiety disorder 08/10/2014  . HIV infection (HCC)   . Internal hemorrhoid 08/07/2016  . Panic attack 08/10/2014  . PCP (pneumocystis jiroveci pneumonia) (HCC)   . Syphilis 09/20/2015    No past surgical history on file.  Family History  Problem Relation Age of Onset  . Kidney disease Maternal Grandmother   . Cancer Neg Hx   . Diabetes Neg Hx       Social History   Socioeconomic History  . Marital status: Single    Spouse name: Not on file  . Number of children: Not on file  . Years of education: Not on file  . Highest education level: Not on file  Social Needs  . Financial resource strain: Not on file  . Food insecurity - worry: Not on file  . Food insecurity - inability: Not on file  . Transportation needs - medical: Not on file   . Transportation needs - non-medical: Not on file  Occupational History  . Not on file  Tobacco Use  . Smoking status: Never Smoker  . Smokeless tobacco: Never Used  Substance and Sexual Activity  . Alcohol use: Yes    Alcohol/week: 0.6 oz    Types: 1 Standard drinks or equivalent per week    Comment: wine  . Drug use: No  . Sexual activity: Not Currently    Partners: Male    Comment: pt declined condoms  Other Topics Concern  . Not on file  Social History Narrative  . Not on file    Allergies  Allergen Reactions  . Zithromax [Azithromycin]     Developed a DVT and was told likely for this abx--this MAKES NO SENSE to me-- [name]     Current Outpatient Medications:  .  abacavir-dolutegravir-lamiVUDine (TRIUMEQ) 600-50-300 MG tablet, Take 1 tablet by mouth daily., Disp: 30 tablet, Rfl: 11         Review of Systems  Constitutional: Negative for activity change, appetite change, chills, diaphoresis, fatigue, fever and unexpected weight change.  HENT: Negative for congestion, rhinorrhea, sinus pressure, sneezing, sore throat and trouble swallowing.   Eyes: Negative for photophobia and visual  disturbance.  Respiratory: Negative for cough, chest tightness, shortness of breath, wheezing and stridor.   Cardiovascular: Negative for chest pain, palpitations and leg swelling.  Gastrointestinal: Negative for abdominal distention, abdominal pain, anal bleeding, blood in stool, constipation, diarrhea, nausea, rectal pain and vomiting.  Genitourinary: Negative for difficulty urinating, dysuria, flank pain and hematuria.  Musculoskeletal: Negative for arthralgias, gait problem, joint swelling and myalgias.  Skin: Negative for color change, pallor, rash and wound.  Neurological: Negative for dizziness, tremors, weakness and light-headedness.  Hematological: Negative for adenopathy. Does not bruise/bleed easily.  Psychiatric/Behavioral: Negative for behavioral problems, confusion,  decreased concentration, dysphoric mood and sleep disturbance.       Objective:   Physical Exam  Constitutional: He is oriented to person, place, and time. He appears well-developed and well-nourished. No distress.  HENT:  Head: Normocephalic and atraumatic.  Mouth/Throat: Oropharynx is clear and moist. No oropharyngeal exudate.  Eyes: Conjunctivae and EOM are normal. Pupils are equal, round, and reactive to light. No scleral icterus.  Neck: Normal range of motion. Neck supple. No JVD present.  Cardiovascular: Normal rate and regular rhythm.  Pulmonary/Chest: Effort normal. No respiratory distress. He has no wheezes.  Abdominal: He exhibits no distension.  Musculoskeletal: He exhibits no edema or tenderness.  Lymphadenopathy:    He has no cervical adenopathy.  Neurological: He is alert and oriented to person, place, and time. He has normal reflexes. He exhibits normal muscle tone. Coordination normal.  Skin: Skin is warm and dry. He is not diaphoretic. No erythema. No pallor.  Psychiatric: His behavior is normal. Judgment and thought content normal. His mood appears not anxious. He does not exhibit a depressed mood.  Nursing note and vitals reviewed.         Assessment & Plan:    #1 HIV disease with hx of AIDS: Continue TRIUMEQ    RTC in 6 months time.  #2 Hemorroid +/- anal wart: refer for HRA here with Dr. Ninetta LightsHatcher and also consider ANCHOR study if still open down the road  #3 ARI: new and wosre than baseline check BMP again, urine lytes, microalbumin to creatinine ratio, UA

## 2017-02-11 LAB — TIQ-MISC

## 2017-02-11 LAB — MICROALBUMIN / CREATININE URINE RATIO
Creatinine, Urine: 328 mg/dL — ABNORMAL HIGH (ref 20–320)
MICROALB/CREAT RATIO: 2 ug/mg{creat} (ref <30)
Microalb, Ur: 0.6 mg/dL

## 2017-02-11 LAB — SODIUM, URINE, RANDOM: SODIUM UR: 253 mmol/L (ref 28–272)

## 2017-04-04 ENCOUNTER — Telehealth: Payer: Self-pay

## 2017-04-04 NOTE — Telephone Encounter (Signed)
Per Dr. Daiva EvesVan Dam I attempted to reach out to Mr. Navon again to schedule an appointment for HRA with Dr. Ninetta LightsHatcher unfortunately his voicemail is full so I was unable to leave a voicemail. Currently HRA is for 04/11/17 for 30 min morning appointments.  Lorenso CourierJose L Maldonado, [place]

## 2017-07-29 ENCOUNTER — Other Ambulatory Visit (HOSPITAL_COMMUNITY)
Admission: RE | Admit: 2017-07-29 | Discharge: 2017-07-29 | Disposition: A | Discharge status: Home / Self Care | Payer: 59 | Source: Ambulatory Visit | Attending: Infectious Disease | Admitting: Infectious Disease

## 2017-07-29 ENCOUNTER — Other Ambulatory Visit: Payer: 59

## 2017-07-29 DIAGNOSIS — B20 Human immunodeficiency virus [HIV] disease: Secondary | ICD-10-CM

## 2017-07-29 DIAGNOSIS — Z113 Encounter for screening for infections with a predominantly sexual mode of transmission: Secondary | ICD-10-CM | POA: Diagnosis not present

## 2017-07-29 DIAGNOSIS — Z7901 Long term (current) use of anticoagulants: Secondary | ICD-10-CM | POA: Insufficient documentation

## 2017-07-29 DIAGNOSIS — N289 Disorder of kidney and ureter, unspecified: Secondary | ICD-10-CM | POA: Diagnosis not present

## 2017-07-29 DIAGNOSIS — Z79899 Other long term (current) drug therapy: Secondary | ICD-10-CM

## 2017-07-29 DIAGNOSIS — A630 Anogenital (venereal) warts: Secondary | ICD-10-CM

## 2017-07-30 LAB — MICROALBUMIN / CREATININE URINE RATIO
CREATININE, URINE: 246 mg/dL (ref 20–320)
Microalb Creat Ratio: 2 mcg/mg creat (ref <30)
Microalb, Ur: 0.4 mg/dL

## 2017-07-30 LAB — T-HELPER CELL (CD4) - (RCID CLINIC ONLY)
CD4 % Helper T Cell: 20 % — ABNORMAL LOW (ref 33–55)
CD4 T Cell Abs: 410 /uL (ref 400–2700)

## 2017-07-30 LAB — RPR: RPR Ser Ql: NONREACTIVE

## 2017-07-30 LAB — COMPLETE METABOLIC PANEL WITH GFR
AG Ratio: 1.7 (calc) (ref 1.0–2.5)
ALBUMIN MSPROF: 4.4 g/dL (ref 3.6–5.1)
ALKALINE PHOSPHATASE (APISO): 82 U/L (ref 40–115)
ALT: 11 U/L (ref 9–46)
AST: 17 U/L (ref 10–40)
BILIRUBIN TOTAL: 0.4 mg/dL (ref 0.2–1.2)
BUN: 14 mg/dL (ref 7–25)
CHLORIDE: 105 mmol/L (ref 98–110)
CO2: 25 mmol/L (ref 20–32)
Calcium: 9.4 mg/dL (ref 8.6–10.3)
Creat: 1.22 mg/dL (ref 0.60–1.35)
GFR, Est African American: 88 mL/min/{1.73_m2} (ref >=60)
GFR, Est Non African American: 76 mL/min/{1.73_m2} (ref >=60)
GLOBULIN: 2.6 g/dL (ref 1.9–3.7)
GLUCOSE: 110 mg/dL — AB (ref 65–99)
Potassium: 3.5 mmol/L (ref 3.5–5.3)
SODIUM: 139 mmol/L (ref 135–146)
Total Protein: 7 g/dL (ref 6.1–8.1)

## 2017-07-30 LAB — CBC WITH DIFFERENTIAL/PLATELET
BASOS ABS: 50 {cells}/uL (ref 0–200)
Basophils Relative: 1.2 %
EOS ABS: 244 {cells}/uL (ref 15–500)
Eosinophils Relative: 5.8 %
HCT: 39.3 % (ref 38.5–50.0)
Hemoglobin: 13.7 g/dL (ref 13.2–17.1)
Lymphs Abs: 2003 cells/uL (ref 850–3900)
MCH: 30.2 pg (ref 27.0–33.0)
MCHC: 34.9 g/dL (ref 32.0–36.0)
MCV: 86.8 fL (ref 80.0–100.0)
MONOS PCT: 11.6 %
MPV: 9.5 fL (ref 7.5–12.5)
NEUTROS ABS: 1415 {cells}/uL — AB (ref 1500–7800)
NEUTROS PCT: 33.7 %
Platelets: 264 10*3/uL (ref 140–400)
RBC: 4.53 10*6/uL (ref 4.20–5.80)
RDW: 12.7 % (ref 11.0–15.0)
Total Lymphocyte: 47.7 %
WBC mixed population: 487 cells/uL (ref 200–950)
WBC: 4.2 10*3/uL (ref 3.8–10.8)

## 2017-07-30 LAB — LIPID PANEL
CHOL/HDL RATIO: 2.9 (calc) (ref <5.0)
Cholesterol: 146 mg/dL (ref <200)
HDL: 50 mg/dL (ref >40)
LDL CHOLESTEROL (CALC): 78 mg/dL
Non-HDL Cholesterol (Calc): 96 mg/dL (calc) (ref <130)
Triglycerides: 94 mg/dL (ref <150)

## 2017-07-30 LAB — URINE CYTOLOGY ANCILLARY ONLY
Chlamydia: NEGATIVE
Neisseria Gonorrhea: NEGATIVE

## 2017-07-31 LAB — HIV-1 RNA QUANT-NO REFLEX-BLD
HIV 1 RNA Quant: <20 copies/mL — AB
HIV-1 RNA QUANT, LOG: DETECTED {Log_copies}/mL — AB

## 2017-08-13 ENCOUNTER — Encounter: Payer: Self-pay | Admitting: Infectious Disease

## 2017-08-13 ENCOUNTER — Ambulatory Visit (INDEPENDENT_AMBULATORY_CARE_PROVIDER_SITE_OTHER): Payer: 59 | Admitting: Infectious Disease

## 2017-08-13 VITALS — BP 126/76 | HR 69 | Temp 97.9°F | Ht 69.0 in | Wt 180.0 lb

## 2017-08-13 DIAGNOSIS — A630 Anogenital (venereal) warts: Secondary | ICD-10-CM | POA: Diagnosis not present

## 2017-08-13 DIAGNOSIS — B20 Human immunodeficiency virus [HIV] disease: Secondary | ICD-10-CM | POA: Diagnosis not present

## 2017-08-13 DIAGNOSIS — Z23 Encounter for immunization: Secondary | ICD-10-CM | POA: Diagnosis not present

## 2017-08-13 DIAGNOSIS — Z113 Encounter for screening for infections with a predominantly sexual mode of transmission: Secondary | ICD-10-CM

## 2017-08-13 DIAGNOSIS — Z79899 Other long term (current) drug therapy: Secondary | ICD-10-CM | POA: Diagnosis not present

## 2017-08-13 HISTORY — DX: Human immunodeficiency virus (HIV) disease: B20

## 2017-08-13 NOTE — Patient Instructions (Signed)
PLEASE MAKE SURE THAT ON CHECK OUT YOU ARE SCHEDULED IN HRA CLINIC WITH DR. HATCHER VS STEPHANIE DIXON

## 2017-08-13 NOTE — Progress Notes (Signed)
Chief complaint: followup for HIV Subjective:    Patient ID: Lance Craig, male    DOB: 1980/06/07, 37 y.o.   MRN: [id number]  HPI  Lance Craig is a 37 y.o. male with history of HIV/AIDS with PCP pneuonia in the past who had been doing superbly well on his  antiviral regimen, TRIUMEQ with undetectable viral load CD4 count now above 400  At last visit we were concerned about whether he might have genital warts and I had wanted him to be seen by Dr. Ninetta Lights in [facility] clinic but this was not scheduled.    Lab Results  Component Value Date   HIV1RNAQUANT <20 DETECTED (A) 07/29/2017   HIV1RNAQUANT 31 (H) 01/27/2017   HIV1RNAQUANT <20 NOT DETECTED 07/24/2016     Lab Results  Component Value Date   CD4TABS 410 07/29/2017   CD4TABS 570 01/27/2017   CD4TABS 510 07/24/2016    Past Medical History:  Diagnosis Date  . Acute renal insufficiency 02/10/2017  . Anal wart 08/07/2016  . Anemia   . Anxiety   . Deep venous thrombosis (HCC)   . Depression   . External hemorrhoid 08/07/2016  . Generalized anxiety disorder 08/10/2014  . HIV disease (HCC) 08/13/2017  . HIV infection (HCC)   . Internal hemorrhoid 08/07/2016  . Panic attack 08/10/2014  . PCP (pneumocystis jiroveci pneumonia) (HCC)   . Syphilis 09/20/2015    No past surgical history on file.  Family History  Problem Relation Age of Onset  . Kidney disease Maternal Grandmother   . Cancer Neg Hx   . Diabetes Neg Hx       Social History   Socioeconomic History  . Marital status: Single    Spouse name: Not on file  . Number of children: Not on file  . Years of education: Not on file  . Highest education level: Not on file  Occupational History  . Not on file  Social Needs  . Financial resource strain: Not on file  . Food insecurity:    Worry: Not on file    Inability: Not on file  . Transportation needs:    Medical: Not on file    Non-medical: Not on file  Tobacco Use  . Smoking status: Never Smoker  .  Smokeless tobacco: Never Used  Substance and Sexual Activity  . Alcohol use: Yes    Alcohol/week: 0.6 oz    Types: 1 Standard drinks or equivalent per week    Comment: wine  . Drug use: No  . Sexual activity: Not Currently    Partners: Male    Comment: pt declined condoms  Lifestyle  . Physical activity:    Days per week: Not on file    Minutes per session: Not on file  . Stress: Not on file  Relationships  . Social connections:    Talks on phone: Not on file    Gets together: Not on file    Attends religious service: Not on file    Active member of club or organization: Not on file    Attends meetings of clubs or organizations: Not on file    Relationship status: Not on file  Other Topics Concern  . Not on file  Social History Narrative  . Not on file    Allergies  Allergen Reactions  . Zithromax [Azithromycin]     Developed a DVT and was told likely for this abx--this MAKES NO SENSE to me-- [name]     Current  Outpatient Medications:  .  abacavir-dolutegravir-lamiVUDine (TRIUMEQ) 600-50-300 MG tablet, Take 1 tablet by mouth daily., Disp: 30 tablet, Rfl: 11         Review of Systems  Constitutional: Negative for activity change, appetite change, chills, diaphoresis, fatigue, fever and unexpected weight change.  HENT: Negative for congestion, rhinorrhea, sinus pressure, sneezing, sore throat and trouble swallowing.   Eyes: Negative for photophobia and visual disturbance.  Respiratory: Negative for cough, chest tightness, shortness of breath, wheezing and stridor.   Cardiovascular: Negative for chest pain, palpitations and leg swelling.  Gastrointestinal: Negative for abdominal distention, abdominal pain, anal bleeding, blood in stool, constipation, diarrhea, nausea, rectal pain and vomiting.  Genitourinary: Negative for difficulty urinating, dysuria, flank pain and hematuria.  Musculoskeletal: Negative for arthralgias, gait problem, joint swelling and  myalgias.  Skin: Negative for color change, pallor, rash and wound.  Neurological: Negative for dizziness, tremors, weakness and light-headedness.  Hematological: Negative for adenopathy. Does not bruise/bleed easily.  Psychiatric/Behavioral: Negative for behavioral problems, confusion, decreased concentration, dysphoric mood and sleep disturbance.       Objective:   Physical Exam  Constitutional: He is oriented to person, place, and time. He appears well-developed and well-nourished. No distress.  HENT:  Head: Normocephalic and atraumatic.  Mouth/Throat: Oropharynx is clear and moist. No oropharyngeal exudate.  Eyes: Pupils are equal, round, and reactive to light. Conjunctivae and EOM are normal. No scleral icterus.  Neck: Normal range of motion. Neck supple. No JVD present.  Cardiovascular: Normal rate and regular rhythm.  Pulmonary/Chest: Effort normal. No respiratory distress. He has no wheezes.  Abdominal: He exhibits no distension.  Musculoskeletal: He exhibits no edema or tenderness.  Lymphadenopathy:    He has no cervical adenopathy.  Neurological: He is alert and oriented to person, place, and time. He has normal reflexes. He exhibits normal muscle tone. Coordination normal.  Skin: Skin is warm and dry. He is not diaphoretic. No erythema. No pallor.  Psychiatric: His behavior is normal. Judgment and thought content normal. His mood appears not anxious. He does not exhibit a depressed mood.  Nursing note and vitals reviewed.         Assessment & Plan:    #1 HIV disease with hx of AIDS: Continue TRIUMEQ    RTC in 6 months time.  #2 Hemorroid +/- anal wart: Again reiterated that I would like him seen in the HRA here with Dr. Ninetta Lights

## 2017-08-21 ENCOUNTER — Other Ambulatory Visit: Payer: Self-pay | Admitting: Infectious Disease

## 2017-08-21 DIAGNOSIS — B20 Human immunodeficiency virus [HIV] disease: Secondary | ICD-10-CM

## 2018-01-26 ENCOUNTER — Other Ambulatory Visit: Payer: 59

## 2018-01-28 ENCOUNTER — Other Ambulatory Visit (HOSPITAL_COMMUNITY)
Admission: RE | Admit: 2018-01-28 | Discharge: 2018-01-28 | Disposition: A | Discharge status: Home / Self Care | Payer: 59 | Source: Ambulatory Visit | Attending: Infectious Disease | Admitting: Infectious Disease

## 2018-01-28 ENCOUNTER — Other Ambulatory Visit: Payer: 59

## 2018-01-28 DIAGNOSIS — A630 Anogenital (venereal) warts: Secondary | ICD-10-CM | POA: Insufficient documentation

## 2018-01-28 DIAGNOSIS — Z113 Encounter for screening for infections with a predominantly sexual mode of transmission: Secondary | ICD-10-CM | POA: Insufficient documentation

## 2018-01-28 DIAGNOSIS — B20 Human immunodeficiency virus [HIV] disease: Secondary | ICD-10-CM | POA: Insufficient documentation

## 2018-01-28 DIAGNOSIS — Z79899 Other long term (current) drug therapy: Secondary | ICD-10-CM

## 2018-01-29 LAB — T-HELPER CELL (CD4) - (RCID CLINIC ONLY)
CD4 T CELL HELPER: 22 % — AB (ref 33–55)
CD4 T Cell Abs: 490 /uL (ref 400–2700)

## 2018-01-29 LAB — URINE CYTOLOGY ANCILLARY ONLY
Chlamydia: NEGATIVE
Neisseria Gonorrhea: NEGATIVE

## 2018-01-30 LAB — CBC WITH DIFFERENTIAL/PLATELET
BASOS ABS: 70 {cells}/uL (ref 0–200)
Basophils Relative: 1.6 %
Eosinophils Absolute: 229 cells/uL (ref 15–500)
Eosinophils Relative: 5.2 %
HEMATOCRIT: 38.6 % (ref 38.5–50.0)
Hemoglobin: 13.2 g/dL (ref 13.2–17.1)
LYMPHS ABS: 2134 {cells}/uL (ref 850–3900)
MCH: 29.8 pg (ref 27.0–33.0)
MCHC: 34.2 g/dL (ref 32.0–36.0)
MCV: 87.1 fL (ref 80.0–100.0)
MPV: 9.2 fL (ref 7.5–12.5)
Monocytes Relative: 11.7 %
NEUTROS PCT: 33 %
Neutro Abs: 1452 cells/uL — ABNORMAL LOW (ref 1500–7800)
PLATELETS: 269 10*3/uL (ref 140–400)
RBC: 4.43 10*6/uL (ref 4.20–5.80)
RDW: 12.8 % (ref 11.0–15.0)
TOTAL LYMPHOCYTE: 48.5 %
WBC: 4.4 10*3/uL (ref 3.8–10.8)
WBCMIX: 515 {cells}/uL (ref 200–950)

## 2018-01-30 LAB — RPR TITER: RPR Titer: 1:32 {titer} — ABNORMAL HIGH

## 2018-01-30 LAB — COMPLETE METABOLIC PANEL WITH GFR
AG Ratio: 1.5 (calc) (ref 1.0–2.5)
ALBUMIN MSPROF: 4.3 g/dL (ref 3.6–5.1)
ALKALINE PHOSPHATASE (APISO): 81 U/L (ref 40–115)
ALT: 12 U/L (ref 9–46)
AST: 19 U/L (ref 10–40)
BILIRUBIN TOTAL: 0.4 mg/dL (ref 0.2–1.2)
BUN: 11 mg/dL (ref 7–25)
CHLORIDE: 105 mmol/L (ref 98–110)
CO2: 26 mmol/L (ref 20–32)
CREATININE: 1.24 mg/dL (ref 0.60–1.35)
Calcium: 9.4 mg/dL (ref 8.6–10.3)
GFR, EST AFRICAN AMERICAN: 86 mL/min/{1.73_m2} (ref >=60)
GFR, Est Non African American: 74 mL/min/{1.73_m2} (ref >=60)
GLOBULIN: 2.9 g/dL (ref 1.9–3.7)
Glucose, Bld: 91 mg/dL (ref 65–99)
Potassium: 3.9 mmol/L (ref 3.5–5.3)
SODIUM: 139 mmol/L (ref 135–146)
TOTAL PROTEIN: 7.2 g/dL (ref 6.1–8.1)

## 2018-01-30 LAB — FLUORESCENT TREPONEMAL AB(FTA)-IGG-BLD: FLUORESCENT TREPONEMAL ABS: REACTIVE — AB

## 2018-01-30 LAB — LIPID PANEL
CHOLESTEROL: 146 mg/dL (ref <200)
HDL: 50 mg/dL (ref >40)
LDL CHOLESTEROL (CALC): 81 mg/dL
Non-HDL Cholesterol (Calc): 96 mg/dL (calc) (ref <130)
TRIGLYCERIDES: 70 mg/dL (ref <150)
Total CHOL/HDL Ratio: 2.9 (calc) (ref <5.0)

## 2018-01-30 LAB — HIV-1 RNA QUANT-NO REFLEX-BLD
HIV 1 RNA Quant: <20 copies/mL
HIV-1 RNA QUANT, LOG: NOT DETECTED {Log_copies}/mL

## 2018-01-30 LAB — RPR: RPR Ser Ql: REACTIVE — AB

## 2018-02-03 ENCOUNTER — Telehealth: Payer: Self-pay | Admitting: *Deleted

## 2018-02-03 NOTE — Telephone Encounter (Signed)
RN called patient with intent to relay test results, treatment plan. Phone call went to voicemail, which was full and unable to accept new messages. Will attempt to call again. Patient scheduled to see Dr Daiva EvesVan Dam to follow up on labs 11/25. Andree CossHowell, Percilla Tweten M, RN

## 2018-02-03 NOTE — Telephone Encounter (Signed)
-----   Message from Randall Hissornelius N Van Dam, MD sent at 02/02/2018  1:34 PM EST ----- Patient needs 2,400,000 units of intramuscular penicillin weekly x3 weeks and needs to have partners tested and treated for syphilis and referred for preexposure prophylaxis with pamphlets that we have in clinic ----- Message ----- From: Interface, [facility] Lab Results In Sent: 01/28/2018   4:36 PM EST To: Randall Hissornelius N Van [place], MD

## 2018-02-04 NOTE — Telephone Encounter (Signed)
Left voicemail asking patient to call his doctor's office regarding labs.

## 2018-02-05 NOTE — Telephone Encounter (Signed)
Patient called back, verified identity.  Informed him per Dr. Daiva EvesVan Dam that he is positive for Syphilis and needs to come in to be tested and treated.  Patient set up weekly appointments for Bicillin injections.  Patient will need Bicillin #1 at his office visit with Dr. Daiva EvesVan Dam Monday 02/06/2018.  Patient denies PCN allergy.

## 2018-02-05 NOTE — Telephone Encounter (Signed)
Perfect thanks much Ashley 

## 2018-02-09 ENCOUNTER — Encounter: Payer: Self-pay | Admitting: Infectious Disease

## 2018-02-09 ENCOUNTER — Ambulatory Visit (INDEPENDENT_AMBULATORY_CARE_PROVIDER_SITE_OTHER): Payer: 59 | Admitting: Infectious Disease

## 2018-02-09 VITALS — BP 127/84 | HR 73 | Temp 98.0°F | Ht 69.0 in | Wt 167.0 lb

## 2018-02-09 DIAGNOSIS — B20 Human immunodeficiency virus [HIV] disease: Secondary | ICD-10-CM | POA: Diagnosis not present

## 2018-02-09 DIAGNOSIS — A64 Unspecified sexually transmitted disease: Secondary | ICD-10-CM

## 2018-02-09 DIAGNOSIS — Z23 Encounter for immunization: Secondary | ICD-10-CM

## 2018-02-09 MED ORDER — PENICILLIN G BENZATHINE 1200000 UNIT/2ML IM SUSP
1.2000 10*6.[IU] | Freq: Once | INTRAMUSCULAR | Status: AC
  Administered 2018-02-09: 1.2 10*6.[IU] via INTRAMUSCULAR

## 2018-02-09 MED ORDER — ABACAVIR-DOLUTEGRAVIR-LAMIVUD 600-50-300 MG PO TABS: 1.0000 | ORAL_TABLET | Freq: Every day | ORAL | 11 refills | Status: DC

## 2018-02-09 NOTE — Progress Notes (Signed)
Chief complaint: followup for HIV, syphilis Subjective:    Patient ID: Lance Craig, male    DOB: 07-07-1980, 37 y.o.   MRN: [id number]  HPI  Lance BrilliantHaywood J Reister is a 37 y.o. male with history of HIV/AIDS with PCP pneuonia in the past who had been doing superbly well on his  antiviral regimen, TRIUMEQ with undetectable viral load CD4 count now above 400   Riordan had recently contracted syphilis and is getting his first dose of Bicillin today.  He is a bit disappointed that he got this.  He says he was only sexually active with one person recently.  I recommended that that person be tested for HIV and be referred for preexposure prophylaxis.  I reiterated that he would himself would not pose that individual risk of HIV transmission since he maintains virological suppression.  However we do not know what his partners sexual network contains and whether there may be other HIV-positive individuals not on therapy in that network.  Lab Results  Component Value Date   HIV1RNAQUANT <20 NOT DETECTED 01/28/2018   HIV1RNAQUANT <20 DETECTED (A) 07/29/2017   HIV1RNAQUANT 31 (H) 01/27/2017     Lab Results  Component Value Date   CD4TABS 490 01/28/2018   CD4TABS 410 07/29/2017   CD4TABS 570 01/27/2017    Past Medical History:  Diagnosis Date  . Acute renal insufficiency 02/10/2017  . Anal wart 08/07/2016  . Anemia   . Anxiety   . Deep venous thrombosis (HCC)   . Depression   . External hemorrhoid 08/07/2016  . Generalized anxiety disorder 08/10/2014  . HIV disease (HCC) 08/13/2017  . HIV infection (HCC)   . Internal hemorrhoid 08/07/2016  . Panic attack 08/10/2014  . PCP (pneumocystis jiroveci pneumonia) (HCC)   . Syphilis 09/20/2015    No past surgical history on file.  Family History  Problem Relation Age of Onset  . Kidney disease Maternal Grandmother   . Cancer Neg Hx   . Diabetes Neg Hx       Social History   Socioeconomic History  . Marital status: Single    Spouse name: Not  on file  . Number of children: Not on file  . Years of education: Not on file  . Highest education level: Not on file  Occupational History  . Not on file  Social Needs  . Financial resource strain: Not on file  . Food insecurity:    Worry: Not on file    Inability: Not on file  . Transportation needs:    Medical: Not on file    Non-medical: Not on file  Tobacco Use  . Smoking status: Never Smoker  . Smokeless tobacco: Never Used  Substance and Sexual Activity  . Alcohol use: Yes    Alcohol/week: 1.0 standard drinks    Types: 1 Standard drinks or equivalent per week    Comment: wine  . Drug use: Yes    Types: Marijuana  . Sexual activity: Not Currently    Partners: Male    Comment: pt declined condoms  Lifestyle  . Physical activity:    Days per week: Not on file    Minutes per session: Not on file  . Stress: Not on file  Relationships  . Social connections:    Talks on phone: Not on file    Gets together: Not on file    Attends religious service: Not on file    Active member of club or organization: Not on file  Attends meetings of clubs or organizations: Not on file    Relationship status: Not on file  Other Topics Concern  . Not on file  Social History Narrative  . Not on file    Allergies  Allergen Reactions  . Zithromax [Azithromycin]     Developed a DVT and was told likely for this abx--this MAKES NO SENSE to me-- [name]     Current Outpatient Medications:  .  TRIUMEQ 600-50-300 MG tablet, TAKE 1 TABLET BY MOUTH DAILY, Disp: 30 tablet, Rfl: 5  Current Facility-Administered Medications:  .  penicillin g benzathine (BICILLIN LA) 1200000 UNIT/2ML injection 1.2 Million Units, 1.2 Million Units, Intramuscular, Once, [place], Lisette Grinder, MD .  penicillin g benzathine (BICILLIN LA) 1200000 UNIT/2ML injection 1.2 Million Units, 1.2 Million Units, Intramuscular, Once, [place], Lisette Grinder, MD         Review of Systems  Constitutional: Negative  for activity change, appetite change, chills, diaphoresis, fatigue, fever and unexpected weight change.  HENT: Negative for congestion, rhinorrhea, sinus pressure, sneezing, sore throat and trouble swallowing.   Eyes: Negative for photophobia and visual disturbance.  Respiratory: Negative for cough, chest tightness, shortness of breath, wheezing and stridor.   Cardiovascular: Negative for chest pain, palpitations and leg swelling.  Gastrointestinal: Negative for abdominal distention, abdominal pain, anal bleeding, blood in stool, constipation, diarrhea, nausea, rectal pain and vomiting.  Genitourinary: Negative for difficulty urinating, dysuria, flank pain and hematuria.  Musculoskeletal: Negative for arthralgias, gait problem, joint swelling and myalgias.  Skin: Negative for color change, pallor, rash and wound.  Neurological: Negative for dizziness, tremors, weakness and light-headedness.  Hematological: Negative for adenopathy. Does not bruise/bleed easily.  Psychiatric/Behavioral: Negative for behavioral problems, confusion, decreased concentration, dysphoric mood and sleep disturbance.       Objective:   Physical Exam  Constitutional: He is oriented to person, place, and time. He appears well-developed and well-nourished. No distress.  HENT:  Head: Normocephalic and atraumatic.  Mouth/Throat: Oropharynx is clear and moist. No oropharyngeal exudate.  Eyes: Pupils are equal, round, and reactive to light. Conjunctivae and EOM are normal. No scleral icterus.  Neck: Normal range of motion. Neck supple. No JVD present.  Cardiovascular: Normal rate and regular rhythm.  Pulmonary/Chest: Effort normal. No respiratory distress. He has no wheezes.  Abdominal: He exhibits no distension.  Musculoskeletal: He exhibits no edema or tenderness.  Lymphadenopathy:    He has no cervical adenopathy.  Neurological: He is alert and oriented to person, place, and time. He has normal reflexes. He exhibits  normal muscle tone. Coordination normal.  Skin: Skin is warm and dry. He is not diaphoretic. No erythema. No pallor.  Psychiatric: His behavior is normal. Judgment and thought content normal. His mood appears not anxious. He does not exhibit a depressed mood.  Nursing note and vitals reviewed.         Assessment & Plan:    #1 HIV disease with hx of AIDS: Continue TRIUMEQ   Return to clinic in 6 months  2.  Syphilis begin Bicillin shots a day partners need to be tested and treated also tested for HIV and referred for preexposure prophylaxis if HIV negative

## 2018-02-09 NOTE — Progress Notes (Signed)
Patient received 1 of 3 Bicillin injection today. Patient also received prevnar 13 and Flu vaccine. Patient encouraged to inform recent partners. Patient declined condoms. Patient monitored after injections. No adverse reactions noted.

## 2018-02-16 ENCOUNTER — Ambulatory Visit (INDEPENDENT_AMBULATORY_CARE_PROVIDER_SITE_OTHER): Payer: 59

## 2018-02-16 DIAGNOSIS — A539 Syphilis, unspecified: Secondary | ICD-10-CM | POA: Diagnosis not present

## 2018-02-16 MED ORDER — PENICILLIN G BENZATHINE 1200000 UNIT/2ML IM SUSP
1.2000 10*6.[IU] | Freq: Once | INTRAMUSCULAR | Status: AC
  Administered 2018-02-16 (×2): 1.2 10*6.[IU] via INTRAMUSCULAR

## 2018-02-16 MED ORDER — PENICILLIN G BENZATHINE 1200000 UNIT/2ML IM SUSP: 1.2000 10*6.[IU] | Freq: Once | INTRAMUSCULAR | Status: DC

## 2018-02-21 ENCOUNTER — Other Ambulatory Visit: Payer: Self-pay | Admitting: Infectious Disease

## 2018-02-21 DIAGNOSIS — B20 Human immunodeficiency virus [HIV] disease: Secondary | ICD-10-CM

## 2018-02-23 ENCOUNTER — Ambulatory Visit (INDEPENDENT_AMBULATORY_CARE_PROVIDER_SITE_OTHER): Payer: 59 | Admitting: Behavioral Health

## 2018-02-23 DIAGNOSIS — A539 Syphilis, unspecified: Secondary | ICD-10-CM

## 2018-02-23 DIAGNOSIS — A64 Unspecified sexually transmitted disease: Secondary | ICD-10-CM | POA: Diagnosis not present

## 2018-02-23 MED ORDER — PENICILLIN G BENZATHINE 1200000 UNIT/2ML IM SUSP
1.2000 10*6.[IU] | Freq: Once | INTRAMUSCULAR | Status: AC
  Administered 2018-02-23: 1.2 10*6.[IU] via INTRAMUSCULAR

## 2018-03-23 ENCOUNTER — Ambulatory Visit (INDEPENDENT_AMBULATORY_CARE_PROVIDER_SITE_OTHER): Payer: 59 | Admitting: Family Medicine

## 2018-03-23 ENCOUNTER — Encounter: Payer: Self-pay | Admitting: Family Medicine

## 2018-03-23 VITALS — BP 140/72 | HR 83 | Temp 100.6°F | Ht 69.0 in | Wt 178.0 lb

## 2018-03-23 DIAGNOSIS — R509 Fever, unspecified: Secondary | ICD-10-CM | POA: Diagnosis not present

## 2018-03-23 DIAGNOSIS — J101 Influenza due to other identified influenza virus with other respiratory manifestations: Secondary | ICD-10-CM

## 2018-03-23 LAB — POCT INFLUENZA A/B
Influenza A, POC: NEGATIVE
Influenza B, POC: POSITIVE — AB

## 2018-03-23 MED ORDER — OSELTAMIVIR PHOSPHATE 75 MG PO CAPS: 75.0000 mg | ORAL_CAPSULE | Freq: Two times a day (BID) | ORAL | 0 refills | Status: AC

## 2018-03-23 NOTE — Assessment & Plan Note (Signed)
-  Rapid influenza test positive for influenza B -Start tamiflu, acute symptoms started 2 days ago and has history of HIV -Increased fluid intake recommended -Rest and out of work until fever free 24-48 hours.

## 2018-03-23 NOTE — Progress Notes (Signed)
Lance Craig - 38 y.o. male MRN [id number]  Date of birth: 24-Aug-1980  Subjective Chief Complaint  Patient presents with  . Cough    admits to body aches-productive cough. Denies SOB.    HPI Lance Craig is is a 38 y.o. male with history of HIV here today with complaint of flu like symptoms including fever, body aches, productive cough and congestion.  He has had some congestion on and off for a week but didn't develop fevers and body aches until about 2 days ago.  He has not taken anything for treatment due to concern of interaction with his HIV medications.   He reports compliance with HIV medications.   ROS:  A comprehensive ROS was completed and negative except as noted per HPI  Allergies  Allergen Reactions  . Zithromax [Azithromycin]     Developed a DVT and was told likely for this abx--this MAKES NO SENSE to me-- [name]    Past Medical History:  Diagnosis Date  . Acute renal insufficiency 02/10/2017  . Anal wart 08/07/2016  . Anemia   . Anxiety   . Deep venous thrombosis (HCC)   . Depression   . External hemorrhoid 08/07/2016  . Generalized anxiety disorder 08/10/2014  . HIV disease (HCC) 08/13/2017  . HIV infection (HCC)   . Internal hemorrhoid 08/07/2016  . Panic attack 08/10/2014  . PCP (pneumocystis jiroveci pneumonia) (HCC)   . Syphilis 09/20/2015    No past surgical history on file.  Social History   Socioeconomic History  . Marital status: Single    Spouse name: Not on file  . Number of children: Not on file  . Years of education: Not on file  . Highest education level: Not on file  Occupational History  . Not on file  Social Needs  . Financial resource strain: Not on file  . Food insecurity:    Worry: Not on file    Inability: Not on file  . Transportation needs:    Medical: Not on file    Non-medical: Not on file  Tobacco Use  . Smoking status: Never Smoker  . Smokeless tobacco: Never Used  Substance and Sexual Activity  . Alcohol  use: Yes    Alcohol/week: 1.0 standard drinks    Types: 1 Standard drinks or equivalent per week    Comment: wine  . Drug use: Yes    Types: Marijuana  . Sexual activity: Not Currently    Partners: Male    Comment: pt declined condoms  Lifestyle  . Physical activity:    Days per week: Not on file    Minutes per session: Not on file  . Stress: Not on file  Relationships  . Social connections:    Talks on phone: Not on file    Gets together: Not on file    Attends religious service: Not on file    Active member of club or organization: Not on file    Attends meetings of clubs or organizations: Not on file    Relationship status: Not on file  Other Topics Concern  . Not on file  Social History Narrative  . Not on file    Family History  Problem Relation Age of Onset  . Kidney disease Maternal Grandmother   . Cancer Neg Hx   . Diabetes Neg Hx     Health Maintenance  Topic Date Due  . TETANUS/TDAP  02/09/2000  . INFLUENZA VACCINE  Completed  . HIV Screening  Completed    ----------------------------------------------------------------------------------------------------------------------------------------------------------------------------------------------------------------- Physical Exam BP 140/72   Pulse 83   Temp (!) 100.6 F (38.1 C) (Oral)   Ht 5\' 9"  (1.753 m)   Wt 178 lb (80.7 kg)   SpO2 97%   BMI 26.29 kg/m   Physical Exam Constitutional:      Appearance: Normal appearance. He is ill-appearing. He is not toxic-appearing.  HENT:     Head: Normocephalic and atraumatic.     Right Ear: Tympanic membrane normal.     Left Ear: Tympanic membrane normal.     Nose: No congestion.     Mouth/Throat:     Mouth: Mucous membranes are moist.  Neck:     Musculoskeletal: Neck supple. No neck rigidity.  Cardiovascular:     Rate and Rhythm: Normal rate and regular rhythm.  Pulmonary:     Effort: Pulmonary effort is normal.     Breath sounds: Normal breath  sounds.  Skin:    General: Skin is warm and dry.     Findings: No rash.  Neurological:     General: No focal deficit present.     Mental Status: He is alert.  Psychiatric:        Mood and Affect: Mood normal.        Behavior: Behavior normal.     ------------------------------------------------------------------------------------------------------------------------------------------------------------------------------------------------------------------- Assessment and Plan  Influenza due to influenza virus, type B -Rapid influenza test positive for influenza B -Start tamiflu, acute symptoms started 2 days ago and has history of HIV -Increased fluid intake recommended -Rest and out of work until fever free 24-48 hours.

## 2018-03-23 NOTE — Patient Instructions (Signed)

## 2018-03-27 ENCOUNTER — Telehealth: Payer: Self-pay | Admitting: *Deleted

## 2018-03-27 ENCOUNTER — Encounter: Payer: Self-pay | Admitting: *Deleted

## 2018-03-27 NOTE — Telephone Encounter (Signed)
Updated work note-patient aware ready to pick up

## 2018-03-27 NOTE — Telephone Encounter (Signed)
Copied from CRM [id number]. Topic: General - Other >> Mar 27, 2018  9:27 AM Herby Abraham C wrote: Reason for CRM: pt called in, he states that Dr. Ashley Royalty excused him from work until 03/26/18, pt says that his job will not accept the work note that was presented. Pt says that they advised that it needs to read that he is unable to perform his work duties due to Dx. Pt says that he is still not feeling completely better. He hasn't completely finished his medication yet. Pt would like to know if provider could excuse him from work today also due to him not feeling back 100 percent?   Please advise.   Pt says that he will come in to pick up new work note once updated by provider.   Please assist.

## 2018-03-27 NOTE — Telephone Encounter (Signed)
Ok to extend note and include verbage needed that he is unable to currently perform duties required.

## 2018-04-10 ENCOUNTER — Encounter (HOSPITAL_BASED_OUTPATIENT_CLINIC_OR_DEPARTMENT_OTHER): Payer: Self-pay | Admitting: Emergency Medicine

## 2018-04-10 ENCOUNTER — Emergency Department (HOSPITAL_BASED_OUTPATIENT_CLINIC_OR_DEPARTMENT_OTHER)
Admission: EM | Admit: 2018-04-10 | Discharge: 2018-04-10 | Disposition: A | Discharge status: Home / Self Care | Attending: Emergency Medicine | Admitting: Emergency Medicine

## 2018-04-10 ENCOUNTER — Other Ambulatory Visit: Payer: Self-pay

## 2018-04-10 DIAGNOSIS — M778 Other enthesopathies, not elsewhere classified: Secondary | ICD-10-CM | POA: Insufficient documentation

## 2018-04-10 DIAGNOSIS — M25532 Pain in left wrist: Secondary | ICD-10-CM | POA: Diagnosis present

## 2018-04-10 DIAGNOSIS — Z79899 Other long term (current) drug therapy: Secondary | ICD-10-CM | POA: Diagnosis not present

## 2018-04-10 MED ORDER — NAPROXEN 375 MG PO TABS: ORAL_TABLET | ORAL | 0 refills | Status: DC

## 2018-04-10 MED ORDER — NAPROXEN 250 MG PO TABS
500.0000 mg | ORAL_TABLET | Freq: Once | ORAL | Status: AC
  Administered 2018-04-10: 500 mg via ORAL
  Filled 2018-04-10: qty 2

## 2018-04-10 NOTE — ED Notes (Signed)
Pt verbalizes understanding of d/c instructions and denies any further needs at this time. 

## 2018-04-10 NOTE — ED Triage Notes (Signed)
Pt states he was at work at the [employer] tonight when he started having pain in his left wrist and forearm  Pt states the pain got worse til it was unbearable

## 2018-04-10 NOTE — ED Provider Notes (Signed)
[facility] Provider Note: Lowella DellJ. 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  He developed pain in his dorsal left distal wrist at work several hours ago.  The pain was severe enough to be "unbearable" at its worst.  Pain has now improved with rest.  The pain was worst when lifting boxes and with flexion of the wrist.  He had no specific single injury to cause this.   Past Medical History:  Diagnosis Date  . Acute renal insufficiency 02/10/2017  . Anal wart 08/07/2016  . Anemia   . Anxiety   . Deep venous thrombosis (HCC)   . Depression   . External hemorrhoid 08/07/2016  . Generalized anxiety disorder 08/10/2014  . HIV disease (HCC) 08/13/2017  . Internal hemorrhoid 08/07/2016  . Panic attack 08/10/2014  . PCP (pneumocystis jiroveci pneumonia) (HCC)   . Syphilis 09/20/2015    History reviewed. No pertinent surgical history.  Family History  Problem Relation Age of Onset  . Kidney disease Maternal Grandmother   . Cancer Neg Hx   . Diabetes Neg Hx     Social History   Tobacco Use  . Smoking status: Never Smoker  . Smokeless tobacco: Never Used  Substance Use Topics  . Alcohol use: Yes    Alcohol/week: 1.0 standard drinks    Types: 1 Standard drinks or equivalent per week    Comment: wine  . Drug use: Yes    Types: Marijuana    Prior to Admission medications   Medication Sig Start Date End Date Taking? Authorizing Provider  abacavir-dolutegravir-lamiVUDine (TRIUMEQ) 600-50-300 MG tablet Take 1 tablet by mouth daily. 02/09/18   Randall HissVan Dam, Cornelius N, MD    Allergies Zithromax [azithromycin]   REVIEW OF SYSTEMS  Negative except as noted here or in the History of Present Illness.   PHYSICAL EXAMINATION  Initial Vital Signs Blood  pressure 138/79, pulse 69, temperature 97.7 F (36.5 C), temperature source Oral, resp. rate 16, height 5\' 9"  (1.753 m), weight 74.8 kg, SpO2 100 %.  Examination General: Well-developed, well-nourished male in no acute distress; appearance consistent with age of record HENT: normocephalic; atraumatic Eyes: Normal appearance Neck: supple Heart: regular rate and rhythm Lungs: clear to auscultation bilaterally Abdomen: soft; nondistended; nontender; bowel sounds present Extremities: No deformity; full range of motion; pulses normal; mild tenderness left dorsal distal wrist with mild pain on flexion of left wrist, no erythema or swelling Neurologic: Awake, alert and oriented; motor function intact in all extremities and symmetric; no facial droop Skin: Warm and dry Psychiatric: Normal mood and affect   RESULTS  Summary of this visit's results, reviewed by myself:   EKG Interpretation  Date/Time:    Ventricular Rate:    PR Interval:    QRS Duration:   QT Interval:    QTC Calculation:   R Axis:     Text Interpretation:        Laboratory Studies: No results found for this or any previous visit (from the past 24 hour(s)). Imaging Studies: No results found.  ED COURSE and MDM  Nursing notes and initial vitals signs, including pulse oximetry, reviewed.  Vitals:   04/10/18 0139 04/10/18 0140  BP: 138/79   Pulse: 69   Resp: 16  Temp: 97.7 F (36.5 C)   TempSrc: Oral   SpO2: 100%   Weight:  74.8 kg  Height:  5\' 9"  (1.753 m)   We will treat for tendinitis of left wrist.  PROCEDURES    ED DIAGNOSES     ICD-10-CM   1. Tendinitis of left wrist M77.8        Khamil Lamica, Jonny RuizJohn, MD 04/10/18 [phone]

## 2018-04-13 ENCOUNTER — Ambulatory Visit (INDEPENDENT_AMBULATORY_CARE_PROVIDER_SITE_OTHER): Payer: 59

## 2018-04-13 ENCOUNTER — Encounter: Payer: Self-pay | Admitting: Family Medicine

## 2018-04-13 ENCOUNTER — Ambulatory Visit (INDEPENDENT_AMBULATORY_CARE_PROVIDER_SITE_OTHER): Admitting: Family Medicine

## 2018-04-13 ENCOUNTER — Encounter: Payer: Self-pay | Admitting: *Deleted

## 2018-04-13 VITALS — BP 120/70 | HR 77 | Temp 97.8°F | Resp 12 | Ht 69.0 in | Wt 179.1 lb

## 2018-04-13 DIAGNOSIS — S6992XD Unspecified injury of left wrist, hand and finger(s), subsequent encounter: Secondary | ICD-10-CM

## 2018-04-13 DIAGNOSIS — M778 Other enthesopathies, not elsewhere classified: Secondary | ICD-10-CM | POA: Diagnosis not present

## 2018-04-13 NOTE — Progress Notes (Signed)
ACUTE VISIT   HPI:  Chief Complaint  Patient presents with  . Left wrist pain    left wrist injury at work last week    [name] is a 38 y.o.right handed male is here today complaining of left wrist due to injury that happened at work. He works at the post office, which involved lifting boxes. According to patient, on 04/09/2017 night while he was working she like " something was going to give up."  He started then with left wrist pain, he does not recall any blunt trauma. He has not noted edema or erythema. No limitation of range of motion but movement elicits pain.   Pain was alleviated with rest but exacerbated again when he went back to work on 04/10/2018.  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 According to patient, he was given discharge to follow-up with PCP or Worker's Comp. provider, he would like for me to complete forms and to suggest "limited duty" at work. Pain is exacerbated by holding male for "10 seconds", "severe", and alleviated by rest, no pain.   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 He denies prior history of wrist pain.  He was seen on 04/10/2018 in the ER, diagnosed with left wrist tendinitis. He is on naproxen 375 mg twice daily as needed.   Review of Systems  Constitutional: Negative for chills and fever.  Musculoskeletal: Positive for arthralgias. Negative for joint swelling and neck pain.  Skin: Negative for color change and rash.  Neurological: Negative for weakness and numbness.     Current Outpatient Medications on File Prior to Visit  Medication Sig Dispense Refill  . abacavir-dolutegravir-lamiVUDine (TRIUMEQ) 600-50-300 MG tablet Take 1 tablet by mouth daily. 30 tablet 11  . naproxen (NAPROSYN) 375 MG tablet Take 1 tablet twice daily for wrist pain. 15 tablet 0   Current Facility-Administered Medications on File  Prior to Visit  Medication Dose Route Frequency Provider Last Rate Last Dose  . penicillin g benzathine (BICILLIN LA) 1200000 UNIT/2ML injection 1.2 Million Units  1.2 Million Units Intramuscular Once Cliffton Astersampbell, John, MD         Past Medical History:  Diagnosis Date  . Acute renal insufficiency 02/10/2017  . Anal wart 08/07/2016  . Anemia   . Anxiety   . Deep venous thrombosis (HCC)   . Depression   . External hemorrhoid 08/07/2016  . Generalized anxiety disorder 08/10/2014  . HIV disease (HCC) 08/13/2017  . Internal hemorrhoid 08/07/2016  . Panic attack 08/10/2014  . PCP (pneumocystis jiroveci pneumonia) (HCC)   . Syphilis 09/20/2015   Allergies  Allergen Reactions  . Zithromax [Azithromycin]     Developed a DVT and was told likely for this abx--this MAKES NO SENSE to me-- Lance Craig    Social History   Socioeconomic History  . Marital status: Single    Spouse name: Not on file  . Number of children: Not on file  . Years of education: Not on file  . Highest education level: Not on file  Occupational History  . Not on file  Social Needs  . Financial resource strain: Not on file  . Food insecurity:    Worry: Not on file    Inability: Not on file  . Transportation needs:    Medical: Not on file    Non-medical: Not on file  Tobacco Use  .  Smoking status: Never Smoker  . Smokeless tobacco: Never Used  Substance and Sexual Activity  . Alcohol use: Yes    Alcohol/week: 1.0 standard drinks    Types: 1 Standard drinks or equivalent per week    Comment: wine  . Drug use: Yes    Types: Marijuana  . Sexual activity: Not Currently    Partners: Male    Comment: pt declined condoms  Lifestyle  . Physical activity:    Days per week: Not on file    Minutes per session: Not on file  . Stress: Not on file  Relationships  . Social connections:    Talks on phone: Not on file    Gets together: Not on file    Attends religious service: Not on file    Active member of club or  organization: Not on file    Attends meetings of clubs or organizations: Not on file    Relationship status: Not on file  Other Topics Concern  . Not on file  Social History Narrative  . Not on file    Vitals:   04/13/18 1509  BP: 120/70  Pulse: 77  Resp: 12  Temp: 97.8 F (36.6 C)  SpO2: 99%   Body mass index is 26.45 kg/m.   Physical Exam  Nursing note and vitals reviewed. Constitutional: He is oriented to person, place, and time. He appears well-developed and well-nourished. He does not appear ill. No distress.  HENT:  Head: Normocephalic and atraumatic.  Eyes: Conjunctivae are normal.  Cardiovascular: Normal rate and regular rhythm.  Pulses:      Radial pulses are 2+ on the left side.  Respiratory: Effort normal. No respiratory distress.  Musculoskeletal:        General: No edema.     Left wrist: He exhibits normal range of motion, no tenderness, no bony tenderness, no swelling, no crepitus and no deformity.  Neurological: He is alert and oriented to person, place, and time. He has normal strength.  Skin: Skin is warm. No rash noted. No erythema.  Psychiatric: His mood appears anxious.  Well groomed,good eye contact.      ASSESSMENT AND PLAN:  Mr. Lance Craig was seen today for left wrist pain.  Diagnoses and all orders for this visit:  Wrist injuries, left, subsequent encounter -     DG Wrist Complete Left; Future  Tendinitis of left wrist   He would like to have X ray done. Explained that it may take a few weeks to completely recovered. Work excuse, to go back Thursday. He will continue "limited duty" for 3 weeks.  If pain is persistent we will need to consider referral to orthopedist/sport medicine. We will complete the form I requested.  Return if symptoms worsen or fail to improve.    [name], MD  [facility]. [place] office.

## 2018-04-13 NOTE — Patient Instructions (Addendum)
A few things to remember from today's visit:   Wrist injuries, left, subsequent encounter - Plan: DG Wrist Complete Left  Wrist sprain, left, subsequent encounter  Topical icy hot or Aspercreme lidocaine may help. Continue using splint for 3 more weeks.  If pain is persisting after 3 weeks, will need to consider Ortho evaluation.

## 2018-04-14 ENCOUNTER — Encounter: Payer: Self-pay | Admitting: Family Medicine

## 2018-05-01 ENCOUNTER — Telehealth: Payer: Self-pay | Admitting: Family Medicine

## 2018-05-01 NOTE — Telephone Encounter (Signed)
The patient dropped off a FMLA form   Call patient for pick up: [phone]  Disposition: Dr's folder

## 2018-05-01 NOTE — Telephone Encounter (Signed)
Form placed on doctor's desk for completion. 

## 2018-05-08 ENCOUNTER — Encounter: Payer: Self-pay | Admitting: *Deleted

## 2018-05-08 NOTE — Telephone Encounter (Signed)
Pt called to see if FMLA paperwork is ready for pickup. Please advise. [phone]

## 2018-05-08 NOTE — Telephone Encounter (Signed)
Spoke with patient and informed him that form isn't completed yet and that I would have pcp look at it today and call him when it is completed. Patient verbalized understanding.

## 2018-05-11 NOTE — Telephone Encounter (Signed)
Patient informed that paperwork is completed and ready for pick-up. Patient stated that he would come by the office to pick-up.

## 2018-05-27 ENCOUNTER — Telehealth: Payer: Self-pay | Admitting: Family Medicine

## 2018-05-27 DIAGNOSIS — Z0279 Encounter for issue of other medical certificate: Secondary | ICD-10-CM

## 2018-05-27 NOTE — Telephone Encounter (Signed)
Pt dropped off blank FMLA form and a sample form that showed how the form should be filled out as well as the original one that was filled out previously.  Upon completion pt would like to be called to pick the form up at [phone].  Form was given to [place] and explained to her what the pt was looking for.

## 2018-05-27 NOTE — Telephone Encounter (Signed)
Form given to doctor for completion.

## 2018-06-02 NOTE — Telephone Encounter (Signed)
Form completed, tried calling patient for pick-up, unable to leave message, mailbox full.

## 2018-06-03 NOTE — Telephone Encounter (Signed)
Form placed at front desk ready for pick-up. Tried calling patient to inform, unable to leave message, mailbox full.

## 2018-06-05 NOTE — Telephone Encounter (Signed)
Patient picked up paperwork.  Patient requested to be billed the form fee.

## 2018-08-11 ENCOUNTER — Telehealth: Payer: Self-pay | Admitting: Infectious Disease

## 2018-08-11 NOTE — Telephone Encounter (Signed)
COVID-19 Pre-Screening Questions: 08/11/18 ° °Do you currently have a fever (>100 °F), chills or unexplained body aches? NO ° °Are you currently experiencing new cough, shortness of breath, sore throat, runny nose? NO  °•  °Have you recently travelled outside the state of [place] in the last 14 days? NO °•  °Have you been in contact with someone that is currently pending confirmation of Covid19 testing or has been confirmed to have the Covid19 virus? NO ° °**If the patient answers NO to ALL questions -  advise the patient to please call the clinic before coming to the office should any symptoms develop.  ° ° ° °

## 2018-08-12 ENCOUNTER — Encounter: Payer: 59 | Admitting: Infectious Disease

## 2018-08-12 ENCOUNTER — Other Ambulatory Visit: Payer: Self-pay

## 2018-08-12 ENCOUNTER — Other Ambulatory Visit (HOSPITAL_COMMUNITY)
Admission: RE | Admit: 2018-08-12 | Discharge: 2018-08-12 | Disposition: A | Discharge status: Home / Self Care | Payer: 59 | Source: Ambulatory Visit | Attending: Infectious Disease | Admitting: Infectious Disease

## 2018-08-12 ENCOUNTER — Other Ambulatory Visit: Payer: 59

## 2018-08-12 DIAGNOSIS — B20 Human immunodeficiency virus [HIV] disease: Secondary | ICD-10-CM | POA: Insufficient documentation

## 2018-08-13 LAB — T-HELPER CELL (CD4) - (RCID CLINIC ONLY)
CD4 % Helper T Cell: 22 % — ABNORMAL LOW (ref 33–65)
CD4 T Cell Abs: 559 /uL (ref 400–1790)

## 2018-08-13 LAB — URINE CYTOLOGY ANCILLARY ONLY
Chlamydia: NEGATIVE
Neisseria Gonorrhea: NEGATIVE

## 2018-08-18 LAB — COMPLETE METABOLIC PANEL WITH GFR
AG Ratio: 1.7 (calc) (ref 1.0–2.5)
ALT: 11 U/L (ref 9–46)
AST: 16 U/L (ref 10–40)
Albumin: 4.5 g/dL (ref 3.6–5.1)
Alkaline phosphatase (APISO): 85 U/L (ref 36–130)
BUN: 12 mg/dL (ref 7–25)
CO2: 25 mmol/L (ref 20–32)
Calcium: 9.5 mg/dL (ref 8.6–10.3)
Chloride: 103 mmol/L (ref 98–110)
Creat: 1.16 mg/dL (ref 0.60–1.35)
GFR, Est African American: 93 mL/min/{1.73_m2} (ref >=60)
GFR, Est Non African American: 80 mL/min/{1.73_m2} (ref >=60)
Globulin: 2.7 g/dL (calc) (ref 1.9–3.7)
Glucose, Bld: 92 mg/dL (ref 65–99)
Potassium: 4 mmol/L (ref 3.5–5.3)
Sodium: 137 mmol/L (ref 135–146)
Total Bilirubin: 0.3 mg/dL (ref 0.2–1.2)
Total Protein: 7.2 g/dL (ref 6.1–8.1)

## 2018-08-18 LAB — CBC WITH DIFFERENTIAL/PLATELET
Absolute Monocytes: 503 cells/uL (ref 200–950)
Basophils Absolute: 52 cells/uL (ref 0–200)
Basophils Relative: 1.1 %
Eosinophils Absolute: 230 cells/uL (ref 15–500)
Eosinophils Relative: 4.9 %
HCT: 40.1 % (ref 38.5–50.0)
Hemoglobin: 13.7 g/dL (ref 13.2–17.1)
Lymphs Abs: 2684 cells/uL (ref 850–3900)
MCH: 30 pg (ref 27.0–33.0)
MCHC: 34.2 g/dL (ref 32.0–36.0)
MCV: 87.7 fL (ref 80.0–100.0)
MPV: 9.3 fL (ref 7.5–12.5)
Monocytes Relative: 10.7 %
Neutro Abs: 1231 cells/uL — ABNORMAL LOW (ref 1500–7800)
Neutrophils Relative %: 26.2 %
Platelets: 272 10*3/uL (ref 140–400)
RBC: 4.57 10*6/uL (ref 4.20–5.80)
RDW: 13 % (ref 11.0–15.0)
Total Lymphocyte: 57.1 %
WBC: 4.7 10*3/uL (ref 3.8–10.8)

## 2018-08-18 LAB — RPR: RPR Ser Ql: REACTIVE — AB

## 2018-08-18 LAB — HIV-1 RNA QUANT-NO REFLEX-BLD
HIV 1 RNA Quant: 52 copies/mL — ABNORMAL HIGH
HIV-1 RNA Quant, Log: 1.72 Log copies/mL — ABNORMAL HIGH

## 2018-08-18 LAB — FLUORESCENT TREPONEMAL AB(FTA)-IGG-BLD: Fluorescent Treponemal ABS: REACTIVE — AB

## 2018-08-18 LAB — RPR TITER: RPR Titer: 1:2 {titer} — ABNORMAL HIGH

## 2018-08-24 ENCOUNTER — Other Ambulatory Visit: Payer: Self-pay | Admitting: Infectious Disease

## 2018-08-24 DIAGNOSIS — B20 Human immunodeficiency virus [HIV] disease: Secondary | ICD-10-CM

## 2018-08-25 ENCOUNTER — Ambulatory Visit (INDEPENDENT_AMBULATORY_CARE_PROVIDER_SITE_OTHER): Payer: 59 | Admitting: Infectious Disease

## 2018-08-25 ENCOUNTER — Other Ambulatory Visit: Payer: Self-pay

## 2018-08-25 ENCOUNTER — Encounter: Payer: Self-pay | Admitting: Infectious Disease

## 2018-08-25 DIAGNOSIS — A630 Anogenital (venereal) warts: Secondary | ICD-10-CM | POA: Diagnosis not present

## 2018-08-25 DIAGNOSIS — B20 Human immunodeficiency virus [HIV] disease: Secondary | ICD-10-CM | POA: Diagnosis not present

## 2018-08-25 DIAGNOSIS — F411 Generalized anxiety disorder: Secondary | ICD-10-CM

## 2018-08-25 DIAGNOSIS — A539 Syphilis, unspecified: Secondary | ICD-10-CM

## 2018-08-25 NOTE — Progress Notes (Signed)
Virtual Visit via Telephone Note  I connected with Lance Craig on 08/25/18 at  9:30 AM EDT by telephone and verified that I am speaking with the correct person using two identifiers.  Location: Patient: Home  Provider: [facility] I discussed the limitations, risks, security and privacy concerns of performing an evaluation and management service by telephone and the availability of in person appointments. I also discussed with the patient that there may be a patient responsible charge related to this service. The patient expressed understanding and agreed to proceed.   History of Present Illness:  Lance Craig is a 38 year old African-American man living with HIV that is well controlled on Triumeq.  He did have syphilis this fall but is responded well to penicillin.  He says he misses approximately 3 doses of his Triumeq and last week with results and related to work schedule.  Viral load is still relatively well-controlled with 52 copies.  Immune stem as well preserved.  Other labs are unremarkable.  He currently is living alone.  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  He is in need of a flu shot and a tetanus shot this fall.     Observations/Objective:  HIV disease well-controlled  Syphilis status post treatment  Warts  Assessment and Plan:  HIV continue Triumeq versus consider switch to Dovato or Biktarvy, come in for same-day visit in September we will get labs give him a flu shot and tetanus shot  Syphilis status post treatment continue to screen for sexually transmitted infections.  Genital warts try to get him seen and a high resolution anoscopy clinic.    Follow Up Instructions:    I discussed the assessment and treatment plan with the patient. The patient was provided an opportunity to ask questions and all were answered. The patient agreed with the plan and demonstrated an understanding of the  instructions.   The patient was advised to call back or seek an in-person evaluation if the symptoms worsen or if the condition fails to improve as anticipated.  I provided 15 minutes of non-face-to-face time during this encounter.   Alcide Evener, MD

## 2018-11-25 ENCOUNTER — Ambulatory Visit (INDEPENDENT_AMBULATORY_CARE_PROVIDER_SITE_OTHER): Payer: 59 | Admitting: Infectious Disease

## 2018-11-25 ENCOUNTER — Encounter: Payer: Self-pay | Admitting: Infectious Disease

## 2018-11-25 ENCOUNTER — Other Ambulatory Visit: Payer: Self-pay

## 2018-11-25 VITALS — BP 135/85 | HR 76 | Temp 98.0°F

## 2018-11-25 DIAGNOSIS — B20 Human immunodeficiency virus [HIV] disease: Secondary | ICD-10-CM

## 2018-11-25 DIAGNOSIS — B59 Pneumocystosis: Secondary | ICD-10-CM

## 2018-11-25 DIAGNOSIS — Z23 Encounter for immunization: Secondary | ICD-10-CM

## 2018-11-25 DIAGNOSIS — F411 Generalized anxiety disorder: Secondary | ICD-10-CM | POA: Diagnosis not present

## 2018-11-25 MED ORDER — TRIUMEQ 600-50-300 MG PO TABS: 1.0000 | ORAL_TABLET | Freq: Every day | ORAL | 11 refills | Status: DC

## 2018-11-25 NOTE — Progress Notes (Signed)
Chief complaint: followup for HIV medications having trouble getting FMLA approved by the postal service Subjective:    Patient ID: Lance Craig, male    DOB: [birth year], 38 y.o.   MRN: [id number]  HPI  Lance Craig is a 38 y.o. male with history of HIV/AIDS with PCP pneuonia in the past who had been doing superbly well on his  antiviral regimen, TRIUMEQ with undetectable viral load CD4 count has been healthy.   He did have a history of a DVT that was diagnosed when he had AIDS but has not had any recurrent DVT since then.  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  Other than that he has no specific complaints today.  Past Medical History:  Diagnosis Date  . Acute renal insufficiency 02/10/2017  . Anal wart 08/07/2016  . Anemia   . Anxiety   . Deep venous thrombosis ([place])   . Depression   . External hemorrhoid 08/07/2016  . Generalized anxiety disorder 08/10/2014  . HIV disease ([place]) 08/13/2017  . Internal hemorrhoid 08/07/2016  . Panic attack 08/10/2014  . 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 Syphilis 09/20/2015    No past surgical history on file.  Family History  Problem Relation Age of Onset  . Kidney disease Maternal Grandmother   . Cancer Neg Hx   . Diabetes Neg Hx       Social History   Socioeconomic History  . Marital status: Single    Spouse name: Not on file  . Number of children: Not on file  . Years of education: Not on file  . Highest education level: Not on file  Occupational History  . Not on file  Social Needs  . Financial resource strain: Not on file  . Food insecurity    Worry: Not on file    Inability: Not on file  . Transportation needs    Medical: Not on file    Non-medical: Not on file  Tobacco Use  . Smoking status: Never Smoker  . Smokeless tobacco: Never Used  Substance and Sexual Activity  . Alcohol use:  Yes    Alcohol/week: 1.0 standard drinks    Types: 1 Standard drinks or equivalent per week    Comment: wine  . Drug use: Yes    Types: Marijuana  . Sexual activity: Not Currently    Partners: Male    Comment: pt givne condoms  Lifestyle  . Physical activity    Days per week: Not on file    Minutes per session: Not on file  . Stress: Not on file  Relationships  . Social Herbalist on phone: Not on file    Gets together: Not on file    Attends religious service: Not on file    Active member of club or organization: Not on file    Attends meetings of clubs or organizations: Not on file    Relationship status: Not on file  Other Topics Concern  . Not on file  Social History Narrative  . Not on file    Allergies  Allergen Reactions  . Zithromax [Azithromycin]     Developed a DVT and was told likely for this abx--this MAKES NO SENSE to me-- [place]     Current Outpatient Medications:  .  loratadine (CLARITIN) 10 MG tablet, Take  10 mg by mouth daily as needed for allergies., Disp: , Rfl:  .  TRIUMEQ 600-50-300 MG tablet, TAKE 1 TABLET BY MOUTH DAILY, Disp: 30 tablet, Rfl: 3         Review of Systems  Constitutional: Negative for activity change, appetite change, chills, diaphoresis, fatigue, fever and unexpected weight change.  HENT: Negative for congestion, rhinorrhea, sinus pressure, sneezing, sore throat and trouble swallowing.   Eyes: Negative for photophobia and visual disturbance.  Respiratory: Negative for cough, chest tightness, shortness of breath, wheezing and stridor.   Cardiovascular: Negative for chest pain, palpitations and leg swelling.  Gastrointestinal: Negative for abdominal distention, abdominal pain, anal bleeding, blood in stool, constipation, diarrhea, nausea, rectal pain and vomiting.  Genitourinary: Negative for difficulty urinating, dysuria, flank pain and hematuria.  Musculoskeletal: Negative for arthralgias, gait problem, joint  swelling and myalgias.  Skin: Negative for color change, pallor, rash and wound.  Neurological: Negative for dizziness, tremors, weakness and light-headedness.  Hematological: Negative for adenopathy. Does not bruise/bleed easily.  Psychiatric/Behavioral: Negative for behavioral problems, confusion, decreased concentration, dysphoric mood, sleep disturbance and suicidal ideas. The patient is not nervous/anxious.        Objective:   Physical Exam  Constitutional: He is oriented to person, place, and time. He appears well-developed and well-nourished. No distress.  HENT:  Head: Normocephalic and atraumatic.  Mouth/Throat: Oropharynx is clear and moist. No oropharyngeal exudate.  Eyes: Pupils are equal, round, and reactive to light. Conjunctivae and EOM are normal. No scleral icterus.  Neck: Normal range of motion. Neck supple. No JVD present.  Cardiovascular: Normal rate and regular rhythm.  Pulmonary/Chest: Effort normal. No respiratory distress. He has no wheezes.  Abdominal: He exhibits no distension.  Musculoskeletal:        General: No tenderness or edema.  Lymphadenopathy:    He has no cervical adenopathy.  Neurological: He is alert and oriented to person, place, and time. He has normal reflexes. He exhibits normal muscle tone. Coordination normal.  Skin: Skin is warm and dry. He is not diaphoretic. No erythema. No pallor.  Psychiatric: He has a normal mood and affect. His behavior is normal. Judgment and thought content normal. His mood appears not anxious. He does not exhibit a depressed mood.  Nursing note and vitals reviewed.         Assessment & Plan:    #1 HIV disease with hx of AIDS: Continue TRIUMEQ for now but consider change to Dovato or Biktarvy in the Lance  Return to clinic in 6 months  2.  Syphilis titers are stable   #3 anxiety stable

## 2018-11-26 LAB — T-HELPER CELL (CD4) - (RCID CLINIC ONLY)
CD4 % Helper T Cell: 24 % — ABNORMAL LOW (ref 33–65)
CD4 T Cell Abs: 379 /uL — ABNORMAL LOW (ref 400–1790)

## 2018-11-28 LAB — LIPID PANEL
Cholesterol: 165 mg/dL (ref <200)
HDL: 57 mg/dL (ref >=40)
LDL Cholesterol (Calc): 88 mg/dL (calc)
Non-HDL Cholesterol (Calc): 108 mg/dL (calc) (ref <130)
Total CHOL/HDL Ratio: 2.9 (calc) (ref <5.0)
Triglycerides: 100 mg/dL (ref <150)

## 2018-11-28 LAB — COMPLETE METABOLIC PANEL WITH GFR
AG Ratio: 1.8 (calc) (ref 1.0–2.5)
ALT: 14 U/L (ref 9–46)
AST: 22 U/L (ref 10–40)
Albumin: 4.5 g/dL (ref 3.6–5.1)
Alkaline phosphatase (APISO): 71 U/L (ref 36–130)
BUN: 17 mg/dL (ref 7–25)
CO2: 27 mmol/L (ref 20–32)
Calcium: 9.6 mg/dL (ref 8.6–10.3)
Chloride: 107 mmol/L (ref 98–110)
Creat: 1.24 mg/dL (ref 0.60–1.35)
GFR, Est African American: 86 mL/min/{1.73_m2} (ref >=60)
GFR, Est Non African American: 74 mL/min/{1.73_m2} (ref >=60)
Globulin: 2.5 g/dL (calc) (ref 1.9–3.7)
Glucose, Bld: 78 mg/dL (ref 65–99)
Potassium: 3.9 mmol/L (ref 3.5–5.3)
Sodium: 141 mmol/L (ref 135–146)
Total Bilirubin: 0.6 mg/dL (ref 0.2–1.2)
Total Protein: 7 g/dL (ref 6.1–8.1)

## 2018-11-28 LAB — RPR TITER: RPR Titer: 1:1 {titer} — ABNORMAL HIGH

## 2018-11-28 LAB — CBC WITH DIFFERENTIAL/PLATELET
Absolute Monocytes: 546 cells/uL (ref 200–950)
Basophils Absolute: 51 cells/uL (ref 0–200)
Basophils Relative: 1.3 %
Eosinophils Absolute: 215 cells/uL (ref 15–500)
Eosinophils Relative: 5.5 %
HCT: 38.3 % — ABNORMAL LOW (ref 38.5–50.0)
Hemoglobin: 13.2 g/dL (ref 13.2–17.1)
Lymphs Abs: 1630 cells/uL (ref 850–3900)
MCH: 30.6 pg (ref 27.0–33.0)
MCHC: 34.5 g/dL (ref 32.0–36.0)
MCV: 88.9 fL (ref 80.0–100.0)
MPV: 9.3 fL (ref 7.5–12.5)
Monocytes Relative: 14 %
Neutro Abs: 1459 cells/uL — ABNORMAL LOW (ref 1500–7800)
Neutrophils Relative %: 37.4 %
Platelets: 228 10*3/uL (ref 140–400)
RBC: 4.31 10*6/uL (ref 4.20–5.80)
RDW: 12.7 % (ref 11.0–15.0)
Total Lymphocyte: 41.8 %
WBC: 3.9 10*3/uL (ref 3.8–10.8)

## 2018-11-28 LAB — FLUORESCENT TREPONEMAL AB(FTA)-IGG-BLD: Fluorescent Treponemal ABS: REACTIVE — AB

## 2018-11-28 LAB — HIV-1 RNA QUANT-NO REFLEX-BLD
HIV 1 RNA Quant: <20 copies/mL
HIV-1 RNA Quant, Log: <1.3 Log copies/mL

## 2018-11-28 LAB — RPR: RPR Ser Ql: REACTIVE — AB

## 2019-01-08 ENCOUNTER — Ambulatory Visit (INDEPENDENT_AMBULATORY_CARE_PROVIDER_SITE_OTHER): Payer: 59 | Admitting: Family Medicine

## 2019-01-08 ENCOUNTER — Ambulatory Visit: Payer: 59 | Admitting: Family Medicine

## 2019-01-08 ENCOUNTER — Other Ambulatory Visit: Payer: Self-pay

## 2019-01-08 ENCOUNTER — Encounter: Payer: Self-pay | Admitting: Family Medicine

## 2019-01-08 VITALS — BP 118/80 | HR 68 | Temp 98.1°F | Resp 16 | Ht 69.0 in | Wt 175.0 lb

## 2019-01-08 DIAGNOSIS — M79642 Pain in left hand: Secondary | ICD-10-CM

## 2019-01-08 DIAGNOSIS — M79641 Pain in right hand: Secondary | ICD-10-CM | POA: Diagnosis not present

## 2019-01-08 NOTE — Patient Instructions (Signed)
A few things to remember from today's visit:   Pain in both hands - Plan: Ambulatory referral to Sports Medicine   Hand Pain Many things can cause hand pain. Some common causes are:  An injury.  Repeating the same movement with your hand over and over (overuse).  Osteoporosis.  Arthritis.  Lumps in the tendons or joints of the hand and wrist (ganglion cysts).  Nerve compression syndromes (carpal tunnel syndrome).  Inflammation of the tendons (tendinitis).  Infection. Follow these instructions at home: Pay attention to any changes in your symptoms. Take these actions to help with your discomfort: Managing pain, stiffness, and swelling   Take over-the-counter and prescription medicines only as told by your health care provider.  Wear a hand splint or support as told by your health care provider.  If directed, put ice on the affected area: ? Put ice in a plastic bag. ? Place a towel between your skin and the bag. ? Leave the ice on for 20 minutes, 2-3 times a day. Activity  Take breaks from repetitive activity often.  Avoid activities that make your pain worse.  Minimize stress on your hands and wrists as much as possible.  Do stretches or exercises as told by your health care provider.  Do not do activities that make your pain worse. Contact a health care provider if:  Your pain does not get better after a few days of self-care.  Your pain gets worse.  Your pain affects your ability to do your daily activities. Get help right away if:  Your hand becomes warm, red, or swollen.  Your hand is numb or tingling.  Your hand is extremely swollen or deformed.  Your hand or fingers turn white or blue.  You cannot move your hand, wrist, or fingers. Summary  Many things can cause hand pain.  Contact your health care provider if your pain does not get better after a few days of self care.  Minimize stress on your hands and wrists as much as possible.  Do  not do activities that make your pain worse. This information is not intended to replace advice given to you by your health care provider. Make sure you discuss any questions you have with your health care provider. Document Released: 03/31/2015 Document Revised: 11/28/2017 Document Reviewed: 11/28/2017 Elsevier Patient Education  [place].  Please be sure medication list is accurate. If a new problem present, please set up appointment sooner than planned today.

## 2019-01-08 NOTE — Progress Notes (Signed)
ACUTE VISIT   HPI:  Chief Complaint  Patient presents with  . Hand Pain    [name] is a 38 y.o. male, who is here today complaining of 3 months of bilateral hand pain. Reporting this as a new problem. He was last seen on 04/13/18 for left wrist injury. Problem is intermittent and exacerbated by some activities at work. As part of his job he has to move mail trays, pick them up.  Alleviated by rest.  Pain is sharp, intensity depends of house often he has to lift at work, max 8/10. No Hx of trauma. He has not noted tingling,numbness,burning,or focal weakness. But states that sometimes he has to hold left hand with right one underneath to help support when he is lifting. Right handed.  Negative for IP joint pain,erythema,or edema. Denies limitation of ROM. He wonders if he has carpal tunnel synd.  Denies associated neck pain, states that sometimes he has low back pain with bending. He has not tried OTC medications. Problem is stable.  Requesting FMLA to be completed,so he can leave or not work when having hand pain.   Review of Systems  Constitutional: Negative for activity change, appetite change, chills, fatigue and fever.  HENT: Negative for mouth sores and sore throat.   Respiratory: Negative for cough, shortness of breath and wheezing.   Cardiovascular: Negative for chest pain.  Skin: Negative for rash and wound.  Neurological: Negative for weakness and headaches.  Psychiatric/Behavioral: Negative for sleep disturbance. The patient is not nervous/anxious.   Rest see pertinent positives and negatives per HPI.   Current Outpatient Medications on File Prior to Visit  Medication Sig Dispense Refill  . abacavir-dolutegravir-lamiVUDine (TRIUMEQ) 600-50-300 MG tablet Take 1 tablet by mouth daily. 30 tablet 11  . loratadine (CLARITIN) 10 MG tablet Take 10 mg by mouth daily as needed for allergies.     No current facility-administered medications on file  prior to visit.      Past Medical History:  Diagnosis Date  . Acute renal insufficiency 02/10/2017  . Anal wart 08/07/2016  . Anemia   . Anxiety   . Deep venous thrombosis ([place])   . Depression   . External hemorrhoid 08/07/2016  . Generalized anxiety disorder 08/10/2014  . HIV disease ([place]) 08/13/2017  . Internal hemorrhoid 08/07/2016  . Panic attack 08/10/2014  . PCP (pneumocystis jiroveci pneumonia) ([place])   . Syphilis 09/20/2015   Allergies  Allergen Reactions  . Zithromax [Azithromycin]     Developed a DVT and was told likely for this abx--this MAKES NO SENSE to me-- [place] History   Socioeconomic History  . Marital status: Single    Spouse name: Not on file  . Number of children: Not on file  . Years of education: Not on file  . Highest education level: Not on file  Occupational History  . Not on file  Social Needs  . Financial resource strain: Not on file  . Food insecurity    Worry: Not on file    Inability: Not on file  . Transportation needs    Medical: Not on file    Non-medical: Not on file  Tobacco Use  . Smoking status: Never Smoker  . Smokeless tobacco: Never Used  Substance and Sexual Activity  . Alcohol use: Yes    Alcohol/week: 1.0 standard drinks    Types: 1 Standard drinks or equivalent per week    Comment: wine  .  Drug use: Yes    Types: Marijuana  . Sexual activity: Not Currently    Partners: Male    Comment: pt givne condoms  Lifestyle  . Physical activity    Days per week: Not on file    Minutes per session: Not on file  . Stress: Not on file  Relationships  . Social Musician on phone: Not on file    Gets together: Not on file    Attends religious service: Not on file    Active member of club or organization: Not on file    Attends meetings of clubs or organizations: Not on file    Relationship status: Not on file  Other Topics Concern  . Not on file  Social History Narrative  . Not on file     Vitals:   01/08/19 0848  BP: 118/80  Pulse: 68  Resp: 16  Temp: 98.1 F (36.7 C)  SpO2: 99%   Body mass index is 25.84 kg/m.   Physical Exam  Nursing note and vitals reviewed. Constitutional: He is oriented to person, place, and time. He appears well-developed and well-nourished. He does not appear ill. No distress.  HENT:  Head: Normocephalic and atraumatic.  Eyes: Conjunctivae are normal.  Cardiovascular: Normal rate and regular rhythm.  Respiratory: Effort normal. No respiratory distress.  Musculoskeletal:        General: No edema.     Lumbar back: He exhibits no tenderness and no bony tenderness.     Right hand: He exhibits normal range of motion, no tenderness, no bony tenderness, normal capillary refill, no deformity and no swelling. Normal strength noted.     Left hand: He exhibits normal range of motion, no tenderness, no bony tenderness, normal capillary refill and no deformity. Normal strength noted.     Comments: Tinel and Phalen negative,bilateral. No tenderness upon palpation of radial styloid, no edema or erythema appreciated, no limitation of wrist ROM. Pain is not elicited on radial styloid with Dorathy Daft.   Lymphadenopathy:    He has no cervical adenopathy.  Neurological: He is alert and oriented to person, place, and time. He has normal strength. No cranial nerve deficit. Gait normal.  Skin: Skin is warm. No rash noted. No erythema.  Psychiatric: He has a normal mood and affect.  Well groomed, good eye contact.    ASSESSMENT AND PLAN:  Mr. Lance Craig was seen today for hand pain.  Diagnoses and all orders for this visit:  Pain in both hands -     Ambulatory referral to Sports Medicine   No Hx of trauma ,so I do not think imaging is needed. I do not think pain is caused by carpal tunnel synd, examination today does not suggest it. OTC NSAID's and/or tylenol as needed. Problem is exacerbated by most activities that are in his job  description,so I do not think intermittent FMLA is appropriate. Occupational/ergonomics evaluation will be helpful.  Because 3 months Hx,sport medicine evaluation will be arranged.   Return if symptoms worsen or fail to improve.    Lance Merkey G. [place], MD  [facility]. [place] office.

## 2019-05-25 ENCOUNTER — Ambulatory Visit: Payer: 59 | Admitting: Infectious Disease

## 2019-05-25 ENCOUNTER — Other Ambulatory Visit: Payer: Self-pay

## 2019-06-22 ENCOUNTER — Other Ambulatory Visit: Payer: Self-pay

## 2019-06-22 ENCOUNTER — Encounter: Payer: Self-pay | Admitting: Infectious Disease

## 2019-06-22 ENCOUNTER — Ambulatory Visit (INDEPENDENT_AMBULATORY_CARE_PROVIDER_SITE_OTHER): Payer: 59 | Admitting: Infectious Disease

## 2019-06-22 VITALS — BP 142/84 | HR 73 | Temp 97.4°F | Wt 187.0 lb

## 2019-06-22 DIAGNOSIS — B59 Pneumocystosis: Secondary | ICD-10-CM

## 2019-06-22 DIAGNOSIS — B20 Human immunodeficiency virus [HIV] disease: Secondary | ICD-10-CM

## 2019-06-22 DIAGNOSIS — A539 Syphilis, unspecified: Secondary | ICD-10-CM

## 2019-06-22 DIAGNOSIS — F0630 Mood disorder due to known physiological condition, unspecified: Secondary | ICD-10-CM

## 2019-06-22 MED ORDER — TRIUMEQ 600-50-300 MG PO TABS: 1.0000 | ORAL_TABLET | Freq: Every day | ORAL | 11 refills | Status: DC

## 2019-06-22 NOTE — Progress Notes (Signed)
Chief complaint: followup for HIV medications  Subjective:    Patient ID: Lance Craig, male    DOB: Sep 12, 1980, 39 y.o.   MRN: [id number]  HPI  Lance Craig is a 39 y.o. male with history of HIV/AIDS w initially treated with Atripla eventually changed over to Triumeq which she is maintained perfect virological suppression.  Is not have hepatitis B coinfection.  We discussed changing him to Dovato to get rid of the abacavir versus changing him to [facility] but he was not eager to switch medications at this point in time.  Ith PCP pneuonia in the past     Past Medical History:  Diagnosis Date  . Acute renal insufficiency 02/10/2017  . Anal wart 08/07/2016  . Anemia   . Anxiety   . Deep venous thrombosis ([facility])   . Depression   . External hemorrhoid 08/07/2016  . Generalized anxiety disorder 08/10/2014  . HIV disease ([facility]) 08/13/2017  . Internal hemorrhoid 08/07/2016  . Panic attack 08/10/2014  . PCP (pneumocystis jiroveci pneumonia) ([facility])   . Syphilis 09/20/2015    No past surgical history on file.  Family History  Problem Relation Age of Onset  . Kidney disease Maternal Grandmother   . Cancer Neg Hx   . Diabetes Neg Hx       Social History   Socioeconomic History  . Marital status: Single    Spouse name: Not on file  . Number of children: Not on file  . Years of education: Not on file  . Highest education level: Not on file  Occupational History  . Not on file  Tobacco Use  . Smoking status: Never Smoker  . Smokeless tobacco: Never Used  Substance and Sexual Activity  . Alcohol use: Yes    Alcohol/week: 1.0 standard drinks    Types: 1 Standard drinks or equivalent per week    Comment: wine  . Drug use: Yes    Types: Marijuana  . Sexual activity: Not Currently    Partners: Male    Comment: pt givne condoms  Other Topics Concern  . Not on file  Social History Narrative  . Not on file   Social Determinants of Health   Financial Resource Strain:   .  Difficulty of Paying Living Expenses:   Food Insecurity:   . Worried About Programme researcher, broadcasting/film/video in the Last Year:   . Barista in the Last Year:   Transportation Needs:   . Freight forwarder (Medical):   Marland Kitchen Lack of Transportation (Non-Medical):   Physical Activity:   . Days of Exercise per Week:   . Minutes of Exercise per Session:   Stress:   . Feeling of Stress :   Social Connections:   . Frequency of Communication with Friends and Family:   . Frequency of Social Gatherings with Friends and Family:   . Attends Religious Services:   . Active Member of Clubs or Organizations:   . Attends Banker Meetings:   Marland Kitchen Marital Status:     Allergies  Allergen Reactions  . Zithromax [Azithromycin]     Developed a DVT and was told likely for this abx--this MAKES NO SENSE to me-- [name]     Current Outpatient Medications:  .  abacavir-dolutegravir-lamiVUDine (TRIUMEQ) 600-50-300 MG tablet, Take 1 tablet by mouth daily., Disp: 30 tablet, Rfl: 11 .  loratadine (CLARITIN) 10 MG tablet, Take 10 mg by mouth daily as needed for allergies., Disp: , Rfl:  Review of Systems  Constitutional: Negative for activity change, appetite change, chills, diaphoresis, fatigue, fever and unexpected weight change.  HENT: Negative for congestion, rhinorrhea, sinus pressure, sneezing, sore throat and trouble swallowing.   Eyes: Negative for photophobia and visual disturbance.  Respiratory: Negative for cough, chest tightness, shortness of breath, wheezing and stridor.   Cardiovascular: Negative for chest pain, palpitations and leg swelling.  Gastrointestinal: Negative for abdominal distention, abdominal pain, anal bleeding, blood in stool, constipation, diarrhea, nausea, rectal pain and vomiting.  Genitourinary: Negative for difficulty urinating, dysuria, flank pain and hematuria.  Musculoskeletal: Negative for arthralgias, gait problem, joint swelling and myalgias.   Skin: Negative for color change, pallor, rash and wound.  Neurological: Negative for dizziness, tremors, seizures, weakness and light-headedness.  Hematological: Negative for adenopathy. Does not bruise/bleed easily.  Psychiatric/Behavioral: Negative for behavioral problems, confusion, decreased concentration, dysphoric mood, sleep disturbance and suicidal ideas. The patient is not nervous/anxious.        Objective:   Physical Exam  Constitutional: He is oriented to person, place, and time. He appears well-developed and well-nourished. No distress.  HENT:  Head: Normocephalic and atraumatic.  Mouth/Throat: Oropharynx is clear and moist. No oropharyngeal exudate.  Eyes: Pupils are equal, round, and reactive to light. Conjunctivae and EOM are normal. No scleral icterus.  Neck: No JVD present.  Cardiovascular: Normal rate and regular rhythm.  Pulmonary/Chest: Effort normal. No respiratory distress. He has no wheezes.  Abdominal: He exhibits no distension.  Musculoskeletal:        General: No tenderness, deformity or edema.     Cervical back: Normal range of motion and neck supple.  Lymphadenopathy:    He has no cervical adenopathy.  Neurological: He is alert and oriented to person, place, and time. He has normal reflexes. He exhibits normal muscle tone. Coordination normal.  Skin: Skin is warm and dry. He is not diaphoretic. No erythema. No pallor.  Psychiatric: He has a normal mood and affect. His behavior is normal. Judgment and thought content normal. His mood appears not anxious. He does not exhibit a depressed mood.  Nursing note and vitals reviewed.         Assessment & Plan:    #1 HIV disease with hx of AIDS: Continue TRIUMEQ he will considerchange to Dovato or Biktarvy in the future  Return to clinic in 6 months  2.  Syphilis titers are stable   #3 anxiety stable   #4  Titers B immunity will check hepatitis B surface quantitative antibody might be a candidate for  ACT G study.  He is eager to get COVID-19 vaccination which she will get through his employer

## 2019-06-23 LAB — T-HELPER CELL (CD4) - (RCID CLINIC ONLY)
CD4 % Helper T Cell: 25 % — ABNORMAL LOW (ref 33–65)
CD4 T Cell Abs: 455 /uL (ref 400–1790)

## 2019-06-24 ENCOUNTER — Telehealth: Payer: Self-pay

## 2019-06-24 LAB — RPR: RPR Ser Ql: REACTIVE — AB

## 2019-06-24 LAB — CBC WITH DIFFERENTIAL/PLATELET
Absolute Monocytes: 554 cells/uL (ref 200–950)
Basophils Absolute: 50 cells/uL (ref 0–200)
Basophils Relative: 1.2 %
Eosinophils Absolute: 252 cells/uL (ref 15–500)
Eosinophils Relative: 6 %
HCT: 40.8 % (ref 38.5–50.0)
Hemoglobin: 13.8 g/dL (ref 13.2–17.1)
Lymphs Abs: 1739 cells/uL (ref 850–3900)
MCH: 30.2 pg (ref 27.0–33.0)
MCHC: 33.8 g/dL (ref 32.0–36.0)
MCV: 89.3 fL (ref 80.0–100.0)
MPV: 9.3 fL (ref 7.5–12.5)
Monocytes Relative: 13.2 %
Neutro Abs: 1604 cells/uL (ref 1500–7800)
Neutrophils Relative %: 38.2 %
Platelets: 268 10*3/uL (ref 140–400)
RBC: 4.57 10*6/uL (ref 4.20–5.80)
RDW: 12.8 % (ref 11.0–15.0)
Total Lymphocyte: 41.4 %
WBC: 4.2 10*3/uL (ref 3.8–10.8)

## 2019-06-24 LAB — COMPLETE METABOLIC PANEL WITH GFR
AG Ratio: 1.5 (calc) (ref 1.0–2.5)
ALT: 12 U/L (ref 9–46)
AST: 16 U/L (ref 10–40)
Albumin: 4.3 g/dL (ref 3.6–5.1)
Alkaline phosphatase (APISO): 79 U/L (ref 36–130)
BUN: 11 mg/dL (ref 7–25)
CO2: 28 mmol/L (ref 20–32)
Calcium: 9.6 mg/dL (ref 8.6–10.3)
Chloride: 104 mmol/L (ref 98–110)
Creat: 1.15 mg/dL (ref 0.60–1.35)
GFR, Est African American: 93 mL/min/{1.73_m2} (ref >=60)
GFR, Est Non African American: 80 mL/min/{1.73_m2} (ref >=60)
Globulin: 2.9 g/dL (calc) (ref 1.9–3.7)
Glucose, Bld: 89 mg/dL (ref 65–99)
Potassium: 3.7 mmol/L (ref 3.5–5.3)
Sodium: 139 mmol/L (ref 135–146)
Total Bilirubin: 0.2 mg/dL (ref 0.2–1.2)
Total Protein: 7.2 g/dL (ref 6.1–8.1)

## 2019-06-24 LAB — LIPID PANEL
Cholesterol: 170 mg/dL (ref <200)
HDL: 52 mg/dL (ref >=40)
LDL Cholesterol (Calc): 102 mg/dL (calc) — ABNORMAL HIGH
Non-HDL Cholesterol (Calc): 118 mg/dL (calc) (ref <130)
Total CHOL/HDL Ratio: 3.3 (calc) (ref <5.0)
Triglycerides: 70 mg/dL (ref <150)

## 2019-06-24 LAB — HIV-1 RNA QUANT-NO REFLEX-BLD
HIV 1 RNA Quant: <20 copies/mL
HIV-1 RNA Quant, Log: <1.3 Log copies/mL

## 2019-06-24 LAB — FLUORESCENT TREPONEMAL AB(FTA)-IGG-BLD: Fluorescent Treponemal ABS: REACTIVE — AB

## 2019-06-24 LAB — HEPATITIS B SURFACE ANTIBODY, QUANTITATIVE: Hep B S AB Quant (Post): 23 m[IU]/mL (ref >=10)

## 2019-06-24 LAB — RPR TITER: RPR Titer: 1:16 {titer} — ABNORMAL HIGH

## 2019-06-24 NOTE — Telephone Encounter (Signed)
-----   Message from Randall Hiss, MD sent at 06/23/2019  5:32 PM EDT ----- Lance Craig needs at minimum of 2.4 MU of PCN weekly  x 3 doses

## 2019-06-24 NOTE — Telephone Encounter (Signed)
Patient notified of lab results. Scheduled appointments to receive 2.4 MU x 3. Asked patient to notify partners to get tested and abstain from sexual contact until treatment is completed. Patient verbalized understanding. Asked if he needs to schedule his COVID vaccine until after his treatment; per screening questions, patient cannot have any other injections for roughly 2 weeks before receiving COVID vaccine.   Kolby Myung Loyola Mast, RN

## 2019-06-24 NOTE — Telephone Encounter (Signed)
Attempted to call patient to schedule appointments for PCN. Left HIPAA compliant voicemail requesting a callback.   Enio Hornback Loyola Mast, RN

## 2019-06-25 ENCOUNTER — Ambulatory Visit (INDEPENDENT_AMBULATORY_CARE_PROVIDER_SITE_OTHER): Payer: 59

## 2019-06-25 ENCOUNTER — Other Ambulatory Visit: Payer: Self-pay

## 2019-06-25 DIAGNOSIS — A539 Syphilis, unspecified: Secondary | ICD-10-CM | POA: Diagnosis not present

## 2019-06-25 MED ORDER — PENICILLIN G BENZATHINE 1200000 UNIT/2ML IM SUSP
1.2000 10*6.[IU] | Freq: Once | INTRAMUSCULAR | Status: AC
  Administered 2019-06-25: 1.2 10*6.[IU] via INTRAMUSCULAR

## 2019-07-02 ENCOUNTER — Other Ambulatory Visit: Payer: Self-pay

## 2019-07-02 ENCOUNTER — Ambulatory Visit (INDEPENDENT_AMBULATORY_CARE_PROVIDER_SITE_OTHER): Payer: 59 | Admitting: *Deleted

## 2019-07-02 DIAGNOSIS — A539 Syphilis, unspecified: Secondary | ICD-10-CM | POA: Diagnosis not present

## 2019-07-02 MED ORDER — PENICILLIN G BENZATHINE 1200000 UNIT/2ML IM SUSP
1.2000 10*6.[IU] | Freq: Once | INTRAMUSCULAR | Status: AC
  Administered 2019-07-02: 1.2 10*6.[IU] via INTRAMUSCULAR

## 2019-07-02 NOTE — Progress Notes (Signed)
RN offered condoms, advised patient to remain abstinent for 7-10 days after treatment, and that the health department would be following up with him to confirm treatment. Patient's questions answered to their satisfaction. Shenita Trego M, RN  

## 2019-07-09 ENCOUNTER — Ambulatory Visit (INDEPENDENT_AMBULATORY_CARE_PROVIDER_SITE_OTHER): Payer: 59

## 2019-07-09 ENCOUNTER — Other Ambulatory Visit: Payer: Self-pay

## 2019-07-09 DIAGNOSIS — A539 Syphilis, unspecified: Secondary | ICD-10-CM | POA: Diagnosis not present

## 2019-07-09 MED ORDER — PENICILLIN G BENZATHINE 1200000 UNIT/2ML IM SUSP
1.2000 10*6.[IU] | Freq: Once | INTRAMUSCULAR | Status: AC
  Administered 2019-07-09: 1.2 10*6.[IU] via INTRAMUSCULAR

## 2019-07-15 ENCOUNTER — Ambulatory Visit: Payer: 59

## 2019-07-16 ENCOUNTER — Ambulatory Visit: Payer: 59 | Attending: Internal Medicine

## 2019-07-16 DIAGNOSIS — Z23 Encounter for immunization: Secondary | ICD-10-CM

## 2019-07-16 NOTE — Progress Notes (Signed)
   Covid-19 Vaccination Clinic  Name:  Lance Craig    MRN: [id number] DOB: [birth year]  07/16/2019  [name] was observed post Covid-19 immunization for 15 minutes without incident. He was provided with Vaccine Information Sheet and instruction to access the V-Safe system.   [name] was instructed to call 911 with any severe reactions post vaccine: Marland Kitchen Difficulty breathing  . Swelling of face and throat  . A fast heartbeat  . A bad rash all over body  . Dizziness and weakness   Immunizations Administered    Name Date Dose VIS Date Route   Pfizer COVID-19 Vaccine 07/16/2019  9:26 AM 0.3 mL 05/12/2018 Intramuscular   Manufacturer: [employer], [employer]   Lot: [id number]   NDC: [address]-6

## 2019-08-09 ENCOUNTER — Ambulatory Visit: Payer: 59 | Attending: Internal Medicine

## 2019-08-09 DIAGNOSIS — Z23 Encounter for immunization: Secondary | ICD-10-CM

## 2019-08-09 NOTE — Progress Notes (Signed)
   Covid-19 Vaccination Clinic  Name:  Lance Craig    MRN: [id number] DOB: [birth year]  08/09/2019  Lance Craig was observed post Covid-19 immunization for 15 minutes without incident. He was provided with Vaccine Information Sheet and instruction to access the V-Safe system.   Lance Craig was instructed to call 911 with any severe reactions post vaccine: Marland Kitchen Difficulty breathing  . Swelling of face and throat  . A fast heartbeat  . A bad rash all over body  . Dizziness and weakness   Immunizations Administered    Name Date Dose VIS Date Route   Pfizer COVID-19 Vaccine 08/09/2019 10:23 AM 0.3 mL 05/12/2018 Intramuscular   Manufacturer: [employer], [employer]   Lot: [id number]   NDC: [address]-6

## 2019-10-19 ENCOUNTER — Other Ambulatory Visit: Payer: Self-pay

## 2019-10-19 ENCOUNTER — Other Ambulatory Visit: Payer: 59

## 2019-10-19 DIAGNOSIS — Z20822 Contact with and (suspected) exposure to covid-19: Secondary | ICD-10-CM

## 2019-10-20 LAB — NOVEL CORONAVIRUS, NAA: SARS-CoV-2, NAA: NOT DETECTED

## 2019-10-20 LAB — SARS-COV-2, NAA 2 DAY TAT

## 2019-10-27 ENCOUNTER — Emergency Department (HOSPITAL_BASED_OUTPATIENT_CLINIC_OR_DEPARTMENT_OTHER)
Admit: 2019-10-27 | Discharge: 2019-10-27 | Disposition: A | Discharge status: Home / Self Care | Payer: 59 | Attending: Emergency Medicine | Admitting: Emergency Medicine

## 2019-10-27 ENCOUNTER — Encounter (HOSPITAL_COMMUNITY): Payer: Self-pay | Admitting: Emergency Medicine

## 2019-10-27 ENCOUNTER — Other Ambulatory Visit: Payer: Self-pay

## 2019-10-27 ENCOUNTER — Emergency Department (HOSPITAL_COMMUNITY): Payer: 59

## 2019-10-27 ENCOUNTER — Encounter: Payer: Self-pay | Admitting: Family Medicine

## 2019-10-27 ENCOUNTER — Ambulatory Visit: Payer: 59 | Admitting: Family Medicine

## 2019-10-27 ENCOUNTER — Emergency Department (HOSPITAL_COMMUNITY)
Admission: EM | Admit: 2019-10-27 | Discharge: 2019-10-27 | Disposition: A | Discharge status: Home / Self Care | Payer: 59 | Attending: Emergency Medicine | Admitting: Emergency Medicine

## 2019-10-27 ENCOUNTER — Ambulatory Visit (INDEPENDENT_AMBULATORY_CARE_PROVIDER_SITE_OTHER): Payer: 59 | Admitting: Family Medicine

## 2019-10-27 VITALS — BP 140/80 | HR 102 | Temp 98.7°F

## 2019-10-27 DIAGNOSIS — M25462 Effusion, left knee: Secondary | ICD-10-CM

## 2019-10-27 DIAGNOSIS — Z86711 Personal history of pulmonary embolism: Secondary | ICD-10-CM | POA: Insufficient documentation

## 2019-10-27 DIAGNOSIS — R52 Pain, unspecified: Secondary | ICD-10-CM

## 2019-10-27 DIAGNOSIS — M7989 Other specified soft tissue disorders: Secondary | ICD-10-CM

## 2019-10-27 DIAGNOSIS — M25562 Pain in left knee: Secondary | ICD-10-CM | POA: Diagnosis not present

## 2019-10-27 DIAGNOSIS — Z86718 Personal history of other venous thrombosis and embolism: Secondary | ICD-10-CM | POA: Diagnosis not present

## 2019-10-27 LAB — SYNOVIAL CELL COUNT + DIFF, W/ CRYSTALS
Crystals, Fluid: NONE SEEN
Eosinophils-Synovial: 1 % (ref 0–1)
Lymphocytes-Synovial Fld: 40 % — ABNORMAL HIGH (ref 0–20)
Monocyte-Macrophage-Synovial Fluid: 54 % (ref 50–90)
Neutrophil, Synovial: 5 % (ref 0–25)
WBC, Synovial: 15330 /mm3 — ABNORMAL HIGH (ref 0–200)

## 2019-10-27 MED ORDER — IBUPROFEN 800 MG PO TABS
800.0000 mg | ORAL_TABLET | Freq: Three times a day (TID) | ORAL | 0 refills | Status: DC | PRN
Start: 2019-10-27 — End: 2019-12-13

## 2019-10-27 MED ORDER — KETOROLAC TROMETHAMINE 60 MG/2ML IM SOLN
60.0000 mg | Freq: Once | INTRAMUSCULAR | Status: AC
  Administered 2019-10-27: 60 mg via INTRAMUSCULAR
  Filled 2019-10-27: qty 2

## 2019-10-27 MED ORDER — LIDOCAINE-EPINEPHRINE 2 %-1:100000 IJ SOLN
10.0000 mL | Freq: Once | INTRAMUSCULAR | Status: AC
  Administered 2019-10-27: 10 mL via INTRADERMAL
  Filled 2019-10-27: qty 1

## 2019-10-27 NOTE — Discharge Instructions (Addendum)
I have placed an ambulatory referral to orthopedics for ongoing evaluation and management of your left knee effusion and swelling.  Please use the crutches, as needed, for support.  I prescribed ibuprofen 800 mg 3 times daily to take for pain and swelling.  Your physical exam and lab work is inconsistent with septic arthritis at this time, however please return should you develop any fevers, chills, redness, worsening swelling, or other new or worsening symptoms.

## 2019-10-27 NOTE — Progress Notes (Signed)
   Subjective:    Patient ID: Lance Craig, male    DOB: [birth year], 39 y.o.   MRN: [id number]  HPI Here for several issues. First he fell about one month ago while loading furniture onto a truck, and he struck his left lower leg against the towing hitch. He had some soreness in the leg for a week or so but this resolve. He did not injure the knee apparently. Then 2 days ago the left knee suddenly swelled up and became quite painful. He has had trouble walking on the leg since then and he was barely able to drive himself to our clinic today. He denies any fever or chest pain or SOB. He has been taking Tylenol with no relief. Also today as he was driving here he flet numbness and tingling in both feet and both hands. After he got out of his car and moved around this numbness subsided. He has been receiving Penicillin G injections under the care of Dr. Daiva Eves for a syphilis infection.    Review of Systems  Constitutional: Negative.   Respiratory: Negative.   Cardiovascular: Negative.   Musculoskeletal: Positive for arthralgias, gait problem and joint swelling.  Neurological: Positive for numbness.       Objective:   Physical Exam Constitutional:      Comments: In a wheelchair, quite anxious and in pain   Cardiovascular:     Rate and Rhythm: Normal rate and regular rhythm.     Pulses: Normal pulses.     Heart sounds: Normal heart sounds.  Pulmonary:     Effort: Pulmonary effort is normal.     Breath sounds: Normal breath sounds.  Musculoskeletal:     Comments: The left knee is swollen, warm, and quite tender diffusely. ROM is very limited by pain   Neurological:     Mental Status: He is alert.           Assessment & Plan:  He likely has either his first gout attack in the knee or he has a septic knee. The transient numbness he felt before arriving her may have been due to a combination of anxiety and pain. He will contact a friend to drive him directly to [facility] ER for  further evaluation.  Gershon Crane, MD

## 2019-10-27 NOTE — Progress Notes (Signed)
Orthopedic Tech Progress Note Patient Details:  Lance Craig 1980-11-09 [id number]  Ortho Devices Ortho Device/Splint Location: applied knee sleeve and crutches Ortho Device/Splint Interventions: Ordered, Application, Adjustment   Post Interventions Patient Tolerated: Well Instructions Provided: Care of device   [name] 10/27/2019, 3:42 PM

## 2019-10-27 NOTE — ED Triage Notes (Signed)
Pt reports feel a week ago. Having left knee pains with weight bearing and swelling. Went to PCP for knee but was advised to come to ED.

## 2019-10-27 NOTE — ED Notes (Signed)
Date and time results received: 10/27/19 6:29 PM  (use smartphrase ".now" to insert current time)  Test: Gram stain Critical Value: WBC's present: No organisms observed  Name of Provider Notified: P.A. Gerre Pebbles  Orders Received? Or Actions Taken?:

## 2019-10-27 NOTE — Progress Notes (Signed)
Left lower extremity venous duplex has been completed. Preliminary results can be found in CV Proc through chart review.  Results were given to Dr. Renaye Rakers.  10/27/19 3:07 PM Olen Cordial RVT

## 2019-10-27 NOTE — ED Provider Notes (Signed)
[facility]-EMERGENCY DEPT Provider Note   CSN: [id number] Arrival date & time: 10/27/19  1153     History Chief Complaint  Patient presents with  . Knee Pain    and swelling    Lance Craig is a 39 y.o. male with PMH of DVT, HIV on Triumeq, and syphilis who presents the ED with complaints of left knee swelling and pain, worse with ambulation.  Patient reports that he struck his left shin on a trailer hitch approximately 4 weeks ago, but did not have any immediate complications.  However, yesterday he noted significant swelling and pain involving his left knee.  He states that he was having difficulty curling up into bed last night to go to sleep given his knee swelling.  He denies any significant tenderness to touch, but states that it is painful to ambulate.  He endorses a history of DVT back in 2011 and is not on any blood thinners.  He also is concerned for possible infected joint given his history of HIV on Triumeq.  He reports that he is compliant with his HIV medication and has excellent outpatient follow-up.  He denies any insect bites or other trauma to the knee specifically.  He also denies any IVDA or recent GC infection, fevers or chills, or other evidence of systemic illness.  HPI     Past Medical History:  Diagnosis Date  . Acute renal insufficiency 02/10/2017  . Anal wart 08/07/2016  . Anemia   . Anxiety   . Deep venous thrombosis (HCC)   . Depression   . External hemorrhoid 08/07/2016  . Generalized anxiety disorder 08/10/2014  . HIV disease (HCC) 08/13/2017  . Internal hemorrhoid 08/07/2016  . Panic attack 08/10/2014  . PCP (pneumocystis jiroveci pneumonia) (HCC)   . Syphilis 09/20/2015    Patient Active Problem List   Diagnosis Date Noted  . Influenza due to influenza virus, type B 03/23/2018  . HIV disease (HCC) 08/13/2017  . Acute renal insufficiency 02/10/2017  . Anal wart 08/07/2016  . Internal hemorrhoid 08/07/2016  . External  hemorrhoid 08/07/2016  . Syphilis 09/20/2015  . Panic attack 08/10/2014  . Generalized anxiety disorder 08/10/2014  . PCP (pneumocystis jiroveci pneumonia) (HCC)   . Lumbago 10/14/2012  . Deep venous thrombosis (HCC)   . Weight gain 07/10/2011  . Seasonal allergies 06/28/2011  . Joint pain 02/21/2011  . Folic acid deficiency 07/31/2010  . Long term current use of anticoagulant 04/07/2010  . AIDS (HCC) 01/01/2010  . PNEUMOCYSTIS PNEUMONIA 01/01/2010  . 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 DVT 12/22/2009  . PE 12/16/2009    History reviewed. No pertinent surgical history.     Family History  Problem Relation Age of Onset  . Kidney disease Maternal Grandmother   . Cancer Neg Hx   . Diabetes Neg Hx     Social History   Tobacco Use  . Smoking status: Never Smoker  . Smokeless tobacco: Never Used  Vaping Use  . Vaping Use: Never used  Substance Use Topics  . Alcohol use: Yes    Alcohol/week: 1.0 standard drink    Types: 1 Standard drinks or equivalent per week    Comment: wine  . Drug use: Yes    Types: Marijuana    Home Medications Prior to Admission medications   Medication Sig Start Date End Date Taking? Authorizing Provider  abacavir-dolutegravir-lamiVUDine (TRIUMEQ) 600-50-300 MG tablet Take 1 tablet by mouth daily. 06/22/19   Daiva EvesVan Dam,  Lisette Grinder, MD  ibuprofen (ADVIL) 800 MG tablet Take 1 tablet (800 mg total) by mouth 3 (three) times daily as needed for moderate pain (Swelling). 10/27/19   Lorelee New, PA-C  loratadine (CLARITIN) 10 MG tablet Take 10 mg by mouth daily as needed for allergies.    [provider]    Allergies    Zithromax [azithromycin]  Review of Systems   Review of Systems  Constitutional: Negative for chills and fever.  Musculoskeletal: Positive for arthralgias and gait problem. Negative for back pain.  Skin: Negative for wound.  Neurological: Negative for weakness and numbness.    Physical  Exam Updated Vital Signs BP (!) 151/96   Pulse 87   Temp 99.1 F (37.3 C) (Oral)   Resp 18   SpO2 98%   Physical Exam Vitals and nursing note reviewed. Exam conducted with a chaperone present.  Constitutional:      General: He is not in acute distress.    Appearance: Normal appearance. He is not ill-appearing.  HENT:     Head: Normocephalic and atraumatic.  Eyes:     General: No scleral icterus.    Conjunctiva/sclera: Conjunctivae normal.  Cardiovascular:     Rate and Rhythm: Normal rate and regular rhythm.     Pulses: Normal pulses.     Heart sounds: Normal heart sounds.  Pulmonary:     Effort: Pulmonary effort is normal. No respiratory distress.     Breath sounds: Normal breath sounds.  Musculoskeletal:     Comments: Left knee: Moderate swelling relative right knee.  No significant tenderness to palpation.  Mild warmth, but no erythema or other overlying skin changes.  Patient is able to fully extend at 180 degrees and flex to 90 degrees actively.  No tibial TTP.  Mild proximal tibial area swelling relative to contralateral leg.   Left hip: No tenderness.  ROM and strength fully intact. Left ankle: No tenderness.  ROM and strength fully intact. Pedal pulse and sensation intact.  Skin:    General: Skin is dry.  Neurological:     Mental Status: He is alert.     GCS: GCS eye subscore is 4. GCS verbal subscore is 5. GCS motor subscore is 6.  Psychiatric:        Mood and Affect: Mood normal.        Behavior: Behavior normal.        Thought Content: Thought content normal.     ED Results / Procedures / Treatments   Labs (all labs ordered are listed, but only abnormal results are displayed) Labs Reviewed  SYNOVIAL CELL COUNT + DIFF, W/ CRYSTALS - Abnormal; Notable for the following components:      Result Value   Appearance-Synovial CLOUDY (*)    WBC, Synovial 15,330 (*)    Lymphocytes-Synovial Fld 40 (*)    All other components within normal limits  BODY FLUID CULTURE   GLUCOSE, BODY FLUID OTHER  PROTEIN, BODY FLUID (OTHER)  URIC ACID, BODY FLUID    EKG None  Radiology DG Knee Complete 4 Views Left  Result Date: 10/27/2019 CLINICAL DATA:  Left knee pain and swelling.  Fall a month ago. EXAM: LEFT KNEE - COMPLETE 4+ VIEW COMPARISON:  None. FINDINGS: No acute fracture or dislocation. Small to moderate joint effusion. Joint spaces are preserved. Bone mineralization is normal. Soft tissues are unremarkable. IMPRESSION: 1. Small to moderate joint effusion. No acute osseous abnormality or significant degenerative changes. Electronically Signed   By: Teresita Madura  Derry M.D.   On: 10/27/2019 13:21   VAS [place] LOWER EXTREMITY VENOUS (DVT) (ONLY [facility] & [facility] 7a-7p)  Result Date: 10/27/2019  Lower Venous DVTStudy Indications: Pain, and Swelling.  Risk Factors: None identified. Limitations: Patient pain tolerance. Comparison Study: No prior studies. Performing Technologist: Chanda Busing RVT  Examination Guidelines: A complete evaluation includes B-mode imaging, spectral Doppler, color Doppler, and power Doppler as needed of all accessible portions of each vessel. Bilateral testing is considered an integral part of a complete examination. Limited examinations for reoccurring indications may be performed as noted. The reflux portion of the exam is performed with the patient in reverse Trendelenburg.  +-----+---------------+---------+-----------+----------+--------------+ RIGHTCompressibilityPhasicitySpontaneityPropertiesThrombus Aging +-----+---------------+---------+-----------+----------+--------------+ CFV  Full           Yes      Yes                                 +-----+---------------+---------+-----------+----------+--------------+   +---------+---------------+---------+-----------+----------+--------------+ LEFT     CompressibilityPhasicitySpontaneityPropertiesThrombus Aging  +---------+---------------+---------+-----------+----------+--------------+ CFV      Full           Yes      Yes                                 +---------+---------------+---------+-----------+----------+--------------+ SFJ      Full                                                        +---------+---------------+---------+-----------+----------+--------------+ FV Prox  Full                                                        +---------+---------------+---------+-----------+----------+--------------+ FV Mid   Full                                                        +---------+---------------+---------+-----------+----------+--------------+ FV Distal               Yes      Yes                                 +---------+---------------+---------+-----------+----------+--------------+ PFV      Full                                                        +---------+---------------+---------+-----------+----------+--------------+ POP      Full           Yes      Yes                                 +---------+---------------+---------+-----------+----------+--------------+ PTV  Full                                                        +---------+---------------+---------+-----------+----------+--------------+ PERO     Full                                                        +---------+---------------+---------+-----------+----------+--------------+     Summary: RIGHT: - No evidence of common femoral vein obstruction.  LEFT: - There is no evidence of deep vein thrombosis in the lower extremity.  - No cystic structure found in the popliteal fossa.  *See table(s) above for measurements and observations.    Preliminary     Procedures Procedures (including critical care time)  Medications Ordered in ED Medications  ketorolac (TORADOL) injection 60 mg (60 mg Intramuscular Given 10/27/19 1558)  lidocaine-EPINEPHrine (XYLOCAINE W/EPI) 2  %-1:100000 (with pres) injection 10 mL (10 mLs Intradermal Given 10/27/19 1641)    ED Course  I have reviewed the triage vital signs and the nursing notes.  Pertinent labs & imaging results that were available during my care of the patient were reviewed by me and considered in my medical decision making (see chart for details).    MDM Rules/Calculators/A&P                          Presents to the ED with complaints of atraumatic left knee swelling.  Patient did sustain injury to his left shin approximately 4 weeks ago, but no recent trauma or injury.  He denies any history of IVDA or recent GC infection that would otherwise put patient at risk for septic arthritis via hematogenous spread.  There is no significant erythema or appreciated on my examination and he is able to actively range his knee without too much difficulty.  Mild limitation due to pain and swelling.  Patient denies any fevers or chills or other history concerning for systemic illness.  On my examination, his vital signs are stable and within normal limits, aside from mildly elevated BP.  No tenderness to light touch concerning for inflammatory arthritis such as gout.  Will obtain plain films and DVT ultrasound for further evaluation.  Imaging DG right knee complete is personally reviewed and demonstrates a small to moderate sized joint effusion.  No acute osseous abnormalities noted. DVT ultrasound is negative for DVT.  Patient was personally evaluated by Dr. Estell Harpin and we do not feel as though joint aspiration is necessary at this time as we have lower suspicion for septic arthritis or inflammatory arthritis.  Pedal pulse and sensation intact throughout.  Will provide crutches and referral to orthopedics in addition to strict ED return precautions.  No hx of PUD or CKD.  Will provide Toradol shot here in the ED and dc with ibuprofen 800 mg TID.  RICE therapy.    Given recommendations of primary care provider, performed bedside  arthrocentesis.  I was able to pull 60 cc of yellowish, translucent fluid.  Patient tolerated procedure well and there was no complications.  Interpreted labs and synovial cell count had mild WBC elevation to 15,330.  It was yellow, cloudy.  No crystals however were seen.  This supports the low suspicion for septic arthritis or gouty arthritis.  However, perhaps this is simply the very early stage of a septic arthritis.  Encouraging very strict ED return precautions with patient.  Patient voices understanding and is agreeable to the plan.  On my final reassessment, he felt much improved after the therapeutic arthrocentesis.  All of the evaluation and work-up results were discussed with the patient and any family at bedside.  Patient and/or family were informed that while patient is appropriate for discharge at this time, some medical emergencies may only develop or become detectable after a period of time.  I specifically instructed patient and/or family to return to return to the ED or seek immediate medical attention for any new or worsening symptoms.  They were provided opportunity to ask any additional questions and have none at this time.  Prior to discharge patient is feeling well, agreeable with plan for discharge home.  They have expressed understanding of verbal discharge instructions as well as return precautions and are agreeable to the plan.   Final Clinical Impression(s) / ED Diagnoses Final diagnoses:  Knee effusion, left    Rx / DC Orders ED Discharge Orders         Ordered    Ambulatory referral to Orthopedic Surgery     Discontinue  Reprint     10/27/19 1514    ibuprofen (ADVIL) 800 MG tablet  3 times daily PRN     Discontinue  Reprint     10/27/19 1514           Lorelee New, PA-C 10/27/19 2118    Bethann Berkshire, MD 10/28/19 1406

## 2019-10-28 ENCOUNTER — Telehealth: Payer: Self-pay | Admitting: Family Medicine

## 2019-10-28 NOTE — Telephone Encounter (Signed)
Pt called to make his [facility]osp F/U appointment. Hospital wrote him out of work up until 08/13.  He is needed something to cover him for Monday and Tuesday because of his appointments.  Please call pt at [phone]

## 2019-10-28 NOTE — Telephone Encounter (Signed)
error 

## 2019-10-29 LAB — URIC ACID, BODY FLUID: Uric Acid Body Fluid: 7.7 mg/dL

## 2019-10-29 LAB — GLUCOSE, BODY FLUID OTHER: Glucose, Body Fluid Other: <2 mg/dL

## 2019-10-29 LAB — PROTEIN, BODY FLUID (OTHER): Total Protein, Body Fluid Other: 4 g/dL

## 2019-10-31 LAB — BODY FLUID CULTURE: Culture: NO GROWTH

## 2019-11-01 ENCOUNTER — Ambulatory Visit: Payer: 59 | Admitting: Family Medicine

## 2019-11-01 NOTE — Telephone Encounter (Signed)
Will discuss with pt at his appointment tomorrow. Letter will be provided then.

## 2019-11-01 NOTE — Telephone Encounter (Signed)
Okay for note? Pt is coming in tomorrow.

## 2019-11-02 ENCOUNTER — Encounter: Payer: Self-pay | Admitting: Family

## 2019-11-02 ENCOUNTER — Encounter: Payer: Self-pay | Admitting: Family Medicine

## 2019-11-02 ENCOUNTER — Other Ambulatory Visit: Payer: Self-pay

## 2019-11-02 ENCOUNTER — Ambulatory Visit (INDEPENDENT_AMBULATORY_CARE_PROVIDER_SITE_OTHER): Payer: 59 | Admitting: Family

## 2019-11-02 ENCOUNTER — Ambulatory Visit (INDEPENDENT_AMBULATORY_CARE_PROVIDER_SITE_OTHER): Payer: 59 | Admitting: Family Medicine

## 2019-11-02 VITALS — Ht 69.0 in | Wt 187.0 lb

## 2019-11-02 VITALS — BP 128/80 | HR 96 | Resp 16 | Ht 69.0 in | Wt 187.0 lb

## 2019-11-02 DIAGNOSIS — R200 Anesthesia of skin: Secondary | ICD-10-CM | POA: Diagnosis not present

## 2019-11-02 DIAGNOSIS — M25462 Effusion, left knee: Secondary | ICD-10-CM | POA: Diagnosis not present

## 2019-11-02 DIAGNOSIS — F419 Anxiety disorder, unspecified: Secondary | ICD-10-CM

## 2019-11-02 DIAGNOSIS — M25562 Pain in left knee: Secondary | ICD-10-CM | POA: Diagnosis not present

## 2019-11-02 DIAGNOSIS — R202 Paresthesia of skin: Secondary | ICD-10-CM

## 2019-11-02 DIAGNOSIS — R55 Syncope and collapse: Secondary | ICD-10-CM

## 2019-11-02 MED ORDER — METHYLPREDNISOLONE ACETATE 40 MG/ML IJ SUSP
40.0000 mg | INTRAMUSCULAR | Status: AC | PRN
  Administered 2019-11-02: 40 mg via INTRA_ARTICULAR

## 2019-11-02 MED ORDER — LIDOCAINE HCL 1 % IJ SOLN
5.0000 mL | INTRAMUSCULAR | Status: AC | PRN
  Administered 2019-11-02: 5 mL

## 2019-11-02 MED ORDER — SERTRALINE HCL 25 MG PO TABS: 25.0000 mg | ORAL_TABLET | Freq: Every day | ORAL | 1 refills | Status: DC

## 2019-11-02 NOTE — Patient Instructions (Signed)
A few things to remember from today's visit:   Numbness and tingling sensation of skin - Plan: Basic metabolic panel, TSH, CBC, Vitamin B12  Anxiety disorder, unspecified type  Pre-syncope - Plan: Basic metabolic panel, TSH, CBC  Avoid driving for now. Monitor for new symptoms.  Today we started Sertraline, this type of medications can increase suicidal risk. This is more prevalent among children,adolecents, and young adults with major depression or other psychiatric disorders. It can also make depression worse. Most common side effects are gastrointestinal, self limited after a few weeks: diarrhea, nausea, constipation  Or diarrhea among some.  In general it is well tolerated. We will follow closely.      If you need refills please call your pharmacy. Do not use My Chart to request refills or for acute issues that need immediate attention.    Please be sure medication list is accurate. If a new problem present, please set up appointment sooner than planned today.

## 2019-11-02 NOTE — Progress Notes (Signed)
Chief Complaint  Patient presents with  . Hospitalization Follow-up   HPI: [name] is a 39 y.o. male, who is here today to follow on recent ER visit.  He was evaluated in the ER on 10/27/2019 because of left knee pain and effusion. Anterior knee pain, exacerbated by movements. No prior Hx. No history of trauma.  Left knee X ray:  1. Small to moderate joint effusion. No acute osseous abnormality or significant degenerative changes. This morning he saw ortho and arthrocentesis and steroid injection today. Synovial fluid was sent for analysis.  He is taking Ibuprofen.  On 10/27/19 he was driving when he had an episode of tingling in hands and left foot. He felt like he was going to pass out. He could not open hands and he was "shaking." He is not sure about duration.  No LOC, no urine or bowel incontinence. No hx of seizure. HIV, he has been on same medication since Dx.  He had another one this morning. Alleviated by breathing exercises and and LLE stretching.  He was told it could be anxiety. He is not driving at this time.  Anxiety since 2011 when Dx'ed with HIV, he was on prn medication for anxiety, ?Alprazolam. Negative for depressed mood. Sleeps about 6-7 hours but has not slept well for the past few days due to knee pain.  Depression screen [facility] 2/9 11/02/2019 11/25/2018 02/09/2018 08/13/2017 02/10/2017  Decreased Interest 1 0 0 0 0  Down, Depressed, Hopeless 2 0 0 0 0  PHQ - 2 Score 3 0 0 0 0  Altered sleeping 2 - - - -  Tired, decreased energy 0 - - - -  Change in appetite 1 - - - -  Feeling bad or failure about yourself  0 - - - -  Trouble concentrating 0 - - - -  Moving slowly or fidgety/restless 0 - - - -  Suicidal thoughts 0 - - - -  PHQ-9 Score 6 - - - -  Difficult doing work/chores Very difficult - - - -   GAD 7 : Generalized Anxiety Score 11/02/2019  Nervous, Anxious, on Edge 2  Control/stop worrying 1  Worry too much - different things 1    Trouble relaxing 2  Restless 0  Easily annoyed or irritable 0  Afraid - awful might happen 1  Total GAD 7 Score 7  Anxiety Difficulty Very difficult   Review of Systems  Constitutional: Negative for chills and fever.  Respiratory: Negative for shortness of breath.   Cardiovascular: Negative for chest pain and palpitations.  Skin: Negative for rash and wound.  Psychiatric/Behavioral: Positive for sleep disturbance (due to pain). Negative for confusion and hallucinations. The patient is nervous/anxious.   Rest see pertinent positives and negatives per HPI.  Current Outpatient Medications on File Prior to Visit  Medication Sig Dispense Refill  . abacavir-dolutegravir-lamiVUDine (TRIUMEQ) 600-50-300 MG tablet Take 1 tablet by mouth daily. 30 tablet 11  . ibuprofen (ADVIL) 800 MG tablet Take 1 tablet (800 mg total) by mouth 3 (three) times daily as needed for moderate pain (Swelling). 21 tablet 0  . loratadine (CLARITIN) 10 MG tablet Take 10 mg by mouth daily as needed for allergies.     No current facility-administered medications on file prior to visit.   Past Medical History:  Diagnosis Date  . Acute renal insufficiency 02/10/2017  . Anal wart 08/07/2016  . Anemia   . Anxiety   . Deep venous thrombosis (HCC)   .  Depression   . External hemorrhoid 08/07/2016  . Generalized anxiety disorder 08/10/2014  . HIV disease (HCC) 08/13/2017  . Internal hemorrhoid 08/07/2016  . Panic attack 08/10/2014  . PCP (pneumocystis jiroveci pneumonia) (HCC)   . Syphilis 09/20/2015   Allergies  Allergen Reactions  . Zithromax [Azithromycin]     Developed a DVT and was told likely for this abx--this MAKES NO SENSE to me-- [name]    Social History   Socioeconomic History  . Marital status: Single    Spouse name: Not on file  . Number of children: Not on file  . Years of education: Not on file  . Highest education level: Not on file  Occupational History  . Not on file  Tobacco Use  .  Smoking status: Never Smoker  . Smokeless tobacco: Never Used  Vaping Use  . Vaping Use: Never used  Substance and Sexual Activity  . Alcohol use: Yes    Alcohol/week: 1.0 standard drink    Types: 1 Standard drinks or equivalent per week    Comment: wine  . Drug use: Yes    Types: Marijuana  . Sexual activity: Not Currently    Partners: Male    Comment: pt givne condoms  Other Topics Concern  . Not on file  Social History Narrative  . Not on file   Social Determinants of Health   Financial Resource Strain:   . Difficulty of Paying Living Expenses:   Food Insecurity:   . Worried About Programme researcher, broadcasting/film/video in the Last Year:   . Barista in the Last Year:   Transportation Needs:   . Freight forwarder (Medical):   Marland Kitchen Lack of Transportation (Non-Medical):   Physical Activity:   . Days of Exercise per Week:   . Minutes of Exercise per Session:   Stress:   . Feeling of Stress :   Social Connections:   . Frequency of Communication with Friends and Family:   . Frequency of Social Gatherings with Friends and Family:   . Attends Religious Services:   . Active Member of Clubs or Organizations:   . Attends Banker Meetings:   Marland Kitchen Marital Status:     Vitals:   11/02/19 1525  BP: 128/80  Pulse: 96  Resp: 16  SpO2: 96%   Body mass index is 27.62 kg/m.  Physical Exam Constitutional:      General: He is not in acute distress.    Appearance: He is well-developed.  HENT:     Head: Normocephalic and atraumatic.  Eyes:     Conjunctiva/sclera: Conjunctivae normal.  Cardiovascular:     Rate and Rhythm: Normal rate and regular rhythm.     Heart sounds: No murmur heard.   Pulmonary:     Effort: Pulmonary effort is normal. No respiratory distress.     Breath sounds: Normal breath sounds.  Musculoskeletal:     Left knee: Effusion present. Decreased range of motion. Tenderness present.     Comments: Long knee brace, left. Antalgic gait , using  crutches.   Skin:    General: Skin is warm.     Findings: No erythema.  Neurological:     General: No focal deficit present.     Mental Status: He is alert and oriented to person, place, and time.  Psychiatric:        Mood and Affect: Affect normal. Mood is anxious.        Thought Content: Thought content  does not include suicidal ideation.   ASSESSMENT AND PLAN:  [name] was seen today for hospitalization follow-up.  Diagnoses and all orders for this visit: Orders Placed This Encounter  Procedures  . Basic metabolic panel  . TSH  . CBC  . Vitamin B12   Lab Results  Component Value Date   TSH 0.80 11/02/2019   Lab Results  Component Value Date   CREATININE 1.17 11/02/2019   BUN 12 11/02/2019   NA 137 11/02/2019   K 4.0 11/02/2019   CL 101 11/02/2019   CO2 26 11/02/2019   Lab Results  Component Value Date   WBC 6.9 11/02/2019   HGB 14.1 11/02/2019   HCT 41.0 11/02/2019   MCV 86.0 11/02/2019   PLT 436 (H) 11/02/2019   Lab Results  Component Value Date   VITAMINB12 321 11/02/2019    Numbness and tingling sensation of skin We discussed possible etiologies. Neurologic evaluation negative today.  Further recommendation will be given according to lab results.  Anxiety disorder, unspecified type GAD7 and PHQ-9 completed based on how he feels due to knee pain, did not have some before knee pain. Still he would like to try medication. Some side effects of sertraline discussed. Follow-up in 6 weeks, before if needed.  -     sertraline (ZOLOFT) 25 MG tablet; Take 1 tablet (25 mg total) by mouth daily.  Pre-syncope We discussed possible etiologies. Certainly anxiety is in the differential diagnosis by the other possible causes need to be considered. Adequate hydration. Recommend not to drive for now. If he has another episode, we may need to consider neurology evaluation.  Acute pain of left knee ? Gout. He is already following with ortho.   Lance Craig G.  [place], MD  [facility]. [place] office.  A few things to remember from today's visit:   Numbness and tingling sensation of skin - Plan: Basic metabolic panel, TSH, CBC, Vitamin B12  Anxiety disorder, unspecified type  Pre-syncope - Plan: Basic metabolic panel, TSH, CBC  Avoid driving for now. Monitor for new symptoms.  Today we started Sertraline, this type of medications can increase suicidal risk. This is more prevalent among children,adolecents, and young adults with major depression or other psychiatric disorders. It can also make depression worse. Most common side effects are gastrointestinal, self limited after a few weeks: diarrhea, nausea, constipation  Or diarrhea among some.  In general it is well tolerated. We will follow closely.  If you need refills please call your pharmacy. Do not use My Chart to request refills or for acute issues that need immediate attention.    Please be sure medication list is accurate. If a new problem present, please set up appointment sooner than planned today.

## 2019-11-02 NOTE — Progress Notes (Signed)
Office Visit Note   Patient: Lance Craig           Date of Birth: Apr 27, 1980           MRN: [id number] Visit Date: 11/02/2019              Requested by: Lorelee New, PA-C [address] [place],  [place] [address] PCP: [place], Betty G, MD  Chief Complaint  Patient presents with  . Left Knee - Pain, New Patient (Initial Visit)      HPI: Patient is a 39 year old gentleman who presents today complaining of left knee swelling and pain this is been ongoing for about a month.  Initially he tripped and fell over a step and struck his anterior shin he did have some swelling at the time of the injury this eventually subsided.  He does work at the post office and reports that several days ago he had a long day felt that he was overdoing it at work he had return of swelling to the left knee.  He did have a subsequent emergency department visit for the left knee pain and swelling aspiration was done of the knee at the time.  There were no crystals or hemarthrosis he did have a white blood cell count of 15,000.  He has not been having any fever or chills continues with swelling and stiffness difficulty with flexion of the knee there is no warmth he has been wearing a neoprene knee brace.    Feels unable due to pain  Assessment & Plan: Visit Diagnoses: No diagnosis found.  Plan: Aspiration injection of the knee today.  Patient tolerated well.  He will follow-up in the office in 3 weeks did provide a out of work note for the interim  Follow-Up Instructions: Return in about 20 days (around 11/22/2019).   Left Knee Exam   Muscle Strength  The patient has normal left knee strength.  Tenderness  The patient is experiencing tenderness in the medial joint line and lateral joint line.  Range of Motion  The patient has normal left knee ROM.  Tests  Varus: negative Valgus: negative  Other  Erythema: absent Swelling: moderate Effusion: effusion present  Comments:  No  warmth      Patient is alert, oriented, no adenopathy, well-dressed, normal affect, normal respiratory effort.   Imaging: No results found. No images are attached to the encounter.  Labs: Lab Results  Component Value Date   REPTSTATUS 10/31/2019 FINAL 10/27/2019   GRAMSTAIN  10/27/2019    ABUNDANT WBC PRESENT,BOTH PMN AND MONONUCLEAR NO ORGANISMS SEEN Gram Stain Report Called to,Read Back By and Verified With: T.SMITH AT 1828 ON 10/27/19 BY N.THOMPSON Performed at [facility], [address] [place], [place] [address]    CULT  10/27/2019    NO GROWTH 3 DAYS Performed at [facility], [address], [place], [place] [address]      Lab Results  Component Value Date   ALBUMIN 4.3 07/24/2016   ALBUMIN 4.2 01/10/2016   ALBUMIN 4.0 09/06/2015    No results found for: MG No results found for: VD25OH  No results found for: PREALBUMIN CBC EXTENDED Latest Ref Rng & Units 06/22/2019 11/25/2018 08/12/2018  WBC 3.8 - 10.8 Thousand/uL 4.2 3.9 4.7  RBC 4.20 - 5.80 Million/uL 4.57 4.31 4.57  HGB 13.2 - 17.1 g/dL 48.8 89.1 69.4  HCT 38 - 50 % 40.8 38.3(L) 40.1  PLT 140 - 400 Thousand/uL 268 228  272  NEUTROABS 1,500 - 7,800 cells/uL 1,604 1,459(L) 1,231(L)  LYMPHSABS 850 - 3,900 cells/uL 1,739 1,630 2,684     Body mass index is 27.62 kg/m.  Orders:  No orders of the defined types were placed in this encounter.  No orders of the defined types were placed in this encounter.    Procedures: Large Joint Inj: L knee on 11/02/2019 11:24 AM Indications: pain Details: 18 G 1.5 in needle, superolateral approach Medications: 5 mL lidocaine 1 %; 40 mg methylPREDNISolone acetate 40 MG/ML Aspirate: clear Consent was given by the patient.      Clinical Data: No additional findings.  ROS:  All other systems negative, except as noted in the HPI. Review of Systems  Constitutional: Negative for chills and fever.  Musculoskeletal: Positive for  arthralgias, joint swelling and myalgias.    Objective: Vital Signs: Ht 5\' 9"  (1.753 m)   Wt 187 lb (84.8 kg)   BMI 27.62 kg/m   Specialty Comments:  No specialty comments available.  PMFS History: Patient Active Problem List   Diagnosis Date Noted  . Influenza due to influenza virus, type B 03/23/2018  . HIV disease (HCC) 08/13/2017  . Acute renal insufficiency 02/10/2017  . Anal wart 08/07/2016  . Internal hemorrhoid 08/07/2016  . External hemorrhoid 08/07/2016  . Syphilis 09/20/2015  . Panic attack 08/10/2014  . Generalized anxiety disorder 08/10/2014  . PCP (pneumocystis jiroveci pneumonia) (HCC)   . Lumbago 10/14/2012  . Deep venous thrombosis (HCC)   . Weight gain 07/10/2011  . Seasonal allergies 06/28/2011  . Joint pain 02/21/2011  . Folic acid deficiency 07/31/2010  . Long term current use of anticoagulant 04/07/2010  . AIDS (HCC) 01/01/2010  . PNEUMOCYSTIS PNEUMONIA 01/01/2010  . 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 DVT 12/22/2009  . PE 12/16/2009   Past Medical History:  Diagnosis Date  . Acute renal insufficiency 02/10/2017  . Anal wart 08/07/2016  . Anemia   . Anxiety   . Deep venous thrombosis (HCC)   . Depression   . External hemorrhoid 08/07/2016  . Generalized anxiety disorder 08/10/2014  . HIV disease (HCC) 08/13/2017  . Internal hemorrhoid 08/07/2016  . Panic attack 08/10/2014  . PCP (pneumocystis jiroveci pneumonia) (HCC)   . Syphilis 09/20/2015    Family History  Problem Relation Age of Onset  . Kidney disease Maternal Grandmother   . Cancer Neg Hx   . Diabetes Neg Hx     History reviewed. No pertinent surgical history. Social History   Occupational History  . Not on file  Tobacco Use  . Smoking status: Never Smoker  . Smokeless tobacco: Never Used  Vaping Use  . Vaping Use: Never used  Substance and Sexual Activity  . Alcohol use: Yes    Alcohol/week: 1.0 standard drink    Types: 1 Standard drinks or  equivalent per week    Comment: wine  . Drug use: Yes    Types: Marijuana  . Sexual activity: Not Currently    Partners: Male    Comment: pt givne condoms

## 2019-11-03 LAB — BASIC METABOLIC PANEL
BUN: 12 mg/dL (ref 7–25)
CO2: 26 mmol/L (ref 20–32)
Calcium: 9.6 mg/dL (ref 8.6–10.3)
Chloride: 101 mmol/L (ref 98–110)
Creat: 1.17 mg/dL (ref 0.60–1.35)
Glucose, Bld: 104 mg/dL — ABNORMAL HIGH (ref 65–99)
Potassium: 4 mmol/L (ref 3.5–5.3)
Sodium: 137 mmol/L (ref 135–146)

## 2019-11-03 LAB — CBC
HCT: 41 % (ref 38.5–50.0)
Hemoglobin: 14.1 g/dL (ref 13.2–17.1)
MCH: 29.6 pg (ref 27.0–33.0)
MCHC: 34.4 g/dL (ref 32.0–36.0)
MCV: 86 fL (ref 80.0–100.0)
MPV: 9 fL (ref 7.5–12.5)
Platelets: 436 10*3/uL — ABNORMAL HIGH (ref 140–400)
RBC: 4.77 10*6/uL (ref 4.20–5.80)
RDW: 13 % (ref 11.0–15.0)
WBC: 6.9 10*3/uL (ref 3.8–10.8)

## 2019-11-03 LAB — TSH: TSH: 0.8 mIU/L (ref 0.40–4.50)

## 2019-11-03 LAB — VITAMIN B12: Vitamin B-12: 321 pg/mL (ref 200–1100)

## 2019-11-12 ENCOUNTER — Telehealth: Payer: Self-pay | Admitting: Family

## 2019-11-12 NOTE — Telephone Encounter (Signed)
Called pt and appt has been made for Monday. Ice, rest, elevation and compression OTC pain medication and follow the package instructions. Voiced understanding and comfortable with the plan.

## 2019-11-12 NOTE — Telephone Encounter (Signed)
Patient called requesting a call back. Patient states his left leg/knee is tingling and still swollen. Please call patient about this matter at [phone].

## 2019-11-15 ENCOUNTER — Ambulatory Visit (INDEPENDENT_AMBULATORY_CARE_PROVIDER_SITE_OTHER): Payer: 59 | Admitting: Orthopedic Surgery

## 2019-11-15 ENCOUNTER — Encounter: Payer: Self-pay | Admitting: Physician Assistant

## 2019-11-15 VITALS — Ht 69.0 in | Wt 187.0 lb

## 2019-11-15 DIAGNOSIS — M25462 Effusion, left knee: Secondary | ICD-10-CM | POA: Diagnosis not present

## 2019-11-15 MED ORDER — IBUPROFEN 800 MG PO TABS: 800.0000 mg | ORAL_TABLET | Freq: Three times a day (TID) | ORAL | 0 refills | Status: DC | PRN

## 2019-11-15 MED ORDER — LIDOCAINE HCL (PF) 1 % IJ SOLN
5.0000 mL | INTRAMUSCULAR | Status: AC | PRN
  Administered 2019-11-15: 5 mL

## 2019-11-15 MED ORDER — LIDOCAINE HCL 1 % IJ SOLN
1.0000 mL | INTRAMUSCULAR | Status: AC | PRN
  Administered 2019-11-15: 1 mL

## 2019-11-15 MED ORDER — METHYLPREDNISOLONE ACETATE 40 MG/ML IJ SUSP
40.0000 mg | INTRAMUSCULAR | Status: AC | PRN
  Administered 2019-11-15: 40 mg via INTRA_ARTICULAR

## 2019-11-15 NOTE — Progress Notes (Signed)
Office Visit Note   Patient: Lance Craig           Date of Birth: 1980-11-05           MRN: [id number] Visit Date: 11/15/2019              Requested by: [place], Betty G, MD [address] [place],  [place] [address] PCP: [place], Betty G, MD  Chief Complaint  Patient presents with  . 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  He complains of increasing pain and swelling he is wearing a neoprene sleeve during the day and he states that this does help provide relief during the day he has been using ibuprofen 800 mg with good relief.  Assessment & Plan: Visit Diagnoses:  1. Effusion, left knee     Plan: We will have patient follow-up in a week to evaluate for return to work he currently works for the post office and will need working restrictions most likely maximum standing 2 hours a day with maximum lifting 20 pounds no squatting or stooping.  Follow-Up Instructions: Return in about 1 week (around 11/22/2019).   Ortho Exam  Patient is alert, oriented, no adenopathy, well-dressed, normal affect, normal respiratory effort. Examination patient has a tense effusion of the left knee he has full active extension no extensor mechanism injury collaterals and cruciates are stable medial lateral joint line are minimally tender to palpation.  Patient had 80 cc of clear synovial fluid aspirated no signs of meniscal or ligamentous tear.  Imaging: No results found. No images are attached to the encounter.  Labs: Lab Results  Component Value Date   REPTSTATUS 10/31/2019 FINAL 10/27/2019   GRAMSTAIN  10/27/2019    ABUNDANT WBC PRESENT,BOTH PMN AND MONONUCLEAR NO ORGANISMS SEEN Gram Stain Report Called to,Read Back By and Verified With: T.SMITH AT 1828 ON 10/27/19 BY N.THOMPSON Performed at [facility], [address], [place], [place] [address]    CULT  10/27/2019    NO GROWTH 3 DAYS Performed at [facility], [address] [place], [place] [address]      Lab Results  Component Value Date   ALBUMIN 4.3 07/24/2016   ALBUMIN 4.2 01/10/2016   ALBUMIN 4.0 09/06/2015    No results found for: MG No results found for: VD25OH  No results found for: PREALBUMIN CBC EXTENDED Latest Ref Rng & Units 11/02/2019 06/22/2019 11/25/2018  WBC 3.8 - 10.8 Thousand/uL 6.9 4.2 3.9  RBC 4.20 - 5.80 Million/uL 4.77 4.57 4.31  HGB 13.2 - 17.1 g/dL 16.6 06.3 01.6  HCT 38 - 50 % 41.0 40.8 38.3(L)  PLT 140 - 400 Thousand/uL 436(H) 268 228  NEUTROABS 1,500 - 7,800 cells/uL - 1,604 1,459(L)  LYMPHSABS 850 - 3,900 cells/uL - 1,739 1,630     Body mass index is 27.62 kg/m.  Orders:  No orders of the defined types were placed in this encounter.  Meds ordered this encounter  Medications  . ibuprofen (ADVIL) 800 MG tablet    Sig: Take 1 tablet (800 mg total) by mouth every 8 (eight) hours as needed.    Dispense:  60 tablet    Refill:  0     Procedures: Large Joint Inj: L knee on 11/15/2019 1:15 PM Indications:  pain and diagnostic evaluation Details: 22 G 1.5 in needle, superolateral approach  Arthrogram: No  Medications: 5 mL lidocaine (PF) 1 %; 40 mg methylPREDNISolone acetate 40 MG/ML; 1 mL lidocaine 1 % Aspirate: 80 mL clear Outcome: tolerated well, no immediate complications Procedure, treatment alternatives, risks and benefits explained, specific risks discussed. Consent was given by the patient. Immediately prior to procedure a time out was called to verify the correct patient, procedure, equipment, support staff and site/side marked as required. Patient was prepped and draped in the usual sterile fashion.      Clinical Data: No additional findings.  ROS:  All other systems negative, except as noted in the HPI. Review of Systems  Objective: Vital  Signs: Ht 5\' 9"  (1.753 m)   Wt 187 lb (84.8 kg)   BMI 27.62 kg/m   Specialty Comments:  No specialty comments available.  PMFS History: Patient Active Problem List   Diagnosis Date Noted  . Influenza due to influenza virus, type B 03/23/2018  . HIV disease (HCC) 08/13/2017  . Acute renal insufficiency 02/10/2017  . Anal wart 08/07/2016  . Internal hemorrhoid 08/07/2016  . External hemorrhoid 08/07/2016  . Syphilis 09/20/2015  . Panic attack 08/10/2014  . Generalized anxiety disorder 08/10/2014  . PCP (pneumocystis jiroveci pneumonia) (HCC)   . Lumbago 10/14/2012  . Deep venous thrombosis (HCC)   . Weight gain 07/10/2011  . Seasonal allergies 06/28/2011  . Joint pain 02/21/2011  . Folic acid deficiency 07/31/2010  . Long term current use of anticoagulant 04/07/2010  . AIDS (HCC) 01/01/2010  . PNEUMOCYSTIS PNEUMONIA 01/01/2010  . 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 DVT 12/22/2009  . PE 12/16/2009   Past Medical History:  Diagnosis Date  . Acute renal insufficiency 02/10/2017  . Anal wart 08/07/2016  . Anemia   . Anxiety   . Deep venous thrombosis (HCC)   . Depression   . External hemorrhoid 08/07/2016  . Generalized anxiety disorder 08/10/2014  . HIV disease (HCC) 08/13/2017  . Internal hemorrhoid 08/07/2016  . Panic attack 08/10/2014  . PCP (pneumocystis jiroveci pneumonia) (HCC)   . Syphilis 09/20/2015    Family History  Problem Relation Age of Onset  . Kidney disease Maternal Grandmother   . Cancer Neg Hx   . Diabetes Neg Hx     History reviewed. No pertinent surgical history. Social History   Occupational History  . Not on file  Tobacco Use  . Smoking status: Never Smoker  . Smokeless tobacco: Never Used  Vaping Use  . Vaping Use: Never used  Substance and Sexual Activity  . Alcohol use: Yes    Alcohol/week: 1.0 standard drink    Types: 1 Standard drinks or equivalent per week    Comment: wine  . Drug use: Yes    Types:  Marijuana  . Sexual activity: Not Currently    Partners: Male    Comment: pt givne condoms

## 2019-11-23 ENCOUNTER — Encounter: Payer: Self-pay | Admitting: Family

## 2019-11-23 ENCOUNTER — Other Ambulatory Visit: Payer: Self-pay

## 2019-11-23 ENCOUNTER — Ambulatory Visit (INDEPENDENT_AMBULATORY_CARE_PROVIDER_SITE_OTHER): Payer: 59 | Admitting: Family

## 2019-11-23 VITALS — Ht 69.0 in | Wt 187.0 lb

## 2019-11-23 DIAGNOSIS — M25562 Pain in left knee: Secondary | ICD-10-CM

## 2019-11-23 DIAGNOSIS — M25462 Effusion, left knee: Secondary | ICD-10-CM

## 2019-11-23 NOTE — Patient Instructions (Signed)
Journal for Nurse Practitioners, 15(4), 263-267. Retrieved December 22, 2017 from http://clinicalkey.com/nursing">  Knee Exercises Ask your health care provider which exercises are safe for you. Do exercises exactly as told by your health care provider and adjust them as directed. It is normal to feel mild stretching, pulling, tightness, or discomfort as you do these exercises. Stop right away if you feel sudden pain or your pain gets worse. Do not begin these exercises until told by your health care provider. Stretching and range-of-motion exercises These exercises warm up your muscles and joints and improve the movement and flexibility of your knee. These exercises also help to relieve pain and swelling. Knee extension, prone 1. Lie on your abdomen (prone position) on a bed. 2. Place your left / right knee just beyond the edge of the surface so your knee is not on the bed. You can put a towel under your left / right thigh just above your kneecap for comfort. 3. Relax your leg muscles and allow gravity to straighten your knee (extension). You should feel a stretch behind your left / right knee. 4. Hold this position for __________ seconds. 5. Scoot up so your knee is supported between repetitions. Repeat __________ times. Complete this exercise __________ times a day. Knee flexion, active  1. Lie on your back with both legs straight. If this causes back discomfort, bend your left / right knee so your foot is flat on the floor. 2. Slowly slide your left / right heel back toward your buttocks. Stop when you feel a gentle stretch in the front of your knee or thigh (flexion). 3. Hold this position for __________ seconds. 4. Slowly slide your left / right heel back to the starting position. Repeat __________ times. Complete this exercise __________ times a day. Quadriceps stretch, prone  1. Lie on your abdomen on a firm surface, such as a bed or padded floor. 2. Bend your left / right knee and hold  your ankle. If you cannot reach your ankle or pant leg, loop a belt around your foot and grab the belt instead. 3. Gently pull your heel toward your buttocks. Your knee should not slide out to the side. You should feel a stretch in the front of your thigh and knee (quadriceps). 4. Hold this position for __________ seconds. Repeat __________ times. Complete this exercise __________ times a day. Hamstring, supine 1. Lie on your back (supine position). 2. Loop a belt or towel over the ball of your left / right foot. The ball of your foot is on the walking surface, right under your toes. 3. Straighten your left / right knee and slowly pull on the belt to raise your leg until you feel a gentle stretch behind your knee (hamstring). ? Do not let your knee bend while you do this. ? Keep your other leg flat on the floor. 4. Hold this position for __________ seconds. Repeat __________ times. Complete this exercise __________ times a day. Strengthening exercises These exercises build strength and endurance in your knee. Endurance is the ability to use your muscles for a long time, even after they get tired. Quadriceps, isometric This exercise stretches the muscles in front of your thigh (quadriceps) without moving your knee joint (isometric). 1. Lie on your back with your left / right leg extended and your other knee bent. Put a rolled towel or small pillow under your knee if told by your health care provider. 2. Slowly tense the muscles in the front of your left /   right thigh. You should see your kneecap slide up toward your hip or see increased dimpling just above the knee. This motion will push the back of the knee toward the floor. 3. For __________ seconds, hold the muscle as tight as you can without increasing your pain. 4. Relax the muscles slowly and completely. Repeat __________ times. Complete this exercise __________ times a day. Straight leg raises This exercise stretches the muscles in front  of your thigh (quadriceps) and the muscles that move your hips (hip flexors). 1. Lie on your back with your left / right leg extended and your other knee bent. 2. Tense the muscles in the front of your left / right thigh. You should see your kneecap slide up or see increased dimpling just above the knee. Your thigh may even shake a bit. 3. Keep these muscles tight as you raise your leg 4-6 inches (10-15 cm) off the floor. Do not let your knee bend. 4. Hold this position for __________ seconds. 5. Keep these muscles tense as you lower your leg. 6. Relax your muscles slowly and completely after each repetition. Repeat __________ times. Complete this exercise __________ times a day. Hamstring, isometric 1. Lie on your back on a firm surface. 2. Bend your left / right knee about __________ degrees. 3. Dig your left / right heel into the surface as if you are trying to pull it toward your buttocks. Tighten the muscles in the back of your thighs (hamstring) to "dig" as hard as you can without increasing any pain. 4. Hold this position for __________ seconds. 5. Release the tension gradually and allow your muscles to relax completely for __________ seconds after each repetition. Repeat __________ times. Complete this exercise __________ times a day. Hamstring curls If told by your health care provider, do this exercise while wearing ankle weights. Begin with __________ lb weights. Then increase the weight by 1 lb (0.5 kg) increments. Do not wear ankle weights that are more than __________ lb. 1. Lie on your abdomen with your legs straight. 2. Bend your left / right knee as far as you can without feeling pain. Keep your hips flat against the floor. 3. Hold this position for __________ seconds. 4. Slowly lower your leg to the starting position. Repeat __________ times. Complete this exercise __________ times a day. Squats This exercise strengthens the muscles in front of your thigh and knee  (quadriceps). 1. Stand in front of a table, with your feet and knees pointing straight ahead. You may rest your hands on the table for balance but not for support. 2. Slowly bend your knees and lower your hips like you are going to sit in a chair. ? Keep your weight over your heels, not over your toes. ? Keep your lower legs upright so they are parallel with the table legs. ? Do not let your hips go lower than your knees. ? Do not bend lower than told by your health care provider. ? If your knee pain increases, do not bend as low. 3. Hold the squat position for __________ seconds. 4. Slowly push with your legs to return to standing. Do not use your hands to pull yourself to standing. Repeat __________ times. Complete this exercise __________ times a day. Wall slides This exercise strengthens the muscles in front of your thigh and knee (quadriceps). 1. Lean your back against a smooth wall or door, and walk your feet out 18-24 inches (46-61 cm) from it. 2. Place your feet hip-width apart. 3.   Slowly slide down the wall or door until your knees bend __________ degrees. Keep your knees over your heels, not over your toes. Keep your knees in line with your hips. 4. Hold this position for __________ seconds. Repeat __________ times. Complete this exercise __________ times a day. Straight leg raises This exercise strengthens the muscles that rotate the leg at the hip and move it away from your body (hip abductors). 1. Lie on your side with your left / right leg in the top position. Lie so your head, shoulder, knee, and hip line up. You may bend your bottom knee to help you keep your balance. 2. Roll your hips slightly forward so your hips are stacked directly over each other and your left / right knee is facing forward. 3. Leading with your heel, lift your top leg 4-6 inches (10-15 cm). You should feel the muscles in your outer hip lifting. ? Do not let your foot drift forward. ? Do not let your knee  roll toward the ceiling. 4. Hold this position for __________ seconds. 5. Slowly return your leg to the starting position. 6. Let your muscles relax completely after each repetition. Repeat __________ times. Complete this exercise __________ times a day. Straight leg raises This exercise stretches the muscles that move your hips away from the front of the pelvis (hip extensors). 1. Lie on your abdomen on a firm surface. You can put a pillow under your hips if that is more comfortable. 2. Tense the muscles in your buttocks and lift your left / right leg about 4-6 inches (10-15 cm). Keep your knee straight as you lift your leg. 3. Hold this position for __________ seconds. 4. Slowly lower your leg to the starting position. 5. Let your leg relax completely after each repetition. Repeat __________ times. Complete this exercise __________ times a day. This information is not intended to replace advice given to you by your health care provider. Make sure you discuss any questions you have with your health care provider. Document Revised: 12/23/2017 Document Reviewed: 12/23/2017 Elsevier Patient Education  2020 [employer]  

## 2019-11-24 ENCOUNTER — Encounter: Payer: Self-pay | Admitting: Family

## 2019-11-24 NOTE — Progress Notes (Signed)
Office Visit Note   Patient: Lance Craig           Date of Birth: 22-May-1980           MRN: [id number] Visit Date: 11/23/2019              Requested by: [place], Betty G, MD [address] [place],  [place] [address] PCP: [place], Betty G, MD  Chief Complaint  Patient presents with  . Left Knee - Follow-up      HPI: Patient is a 39 year old gentleman who presents in follow-up status post blunt trauma left knee states he fell in July landing directly over the tibial tubercle he states he struck a [employer].  He is now about 2 months out from his initial injury.  He reports that he feels he is gradually improving.  He still is nervous to bear weight is using crutches for balance.  He reports he is not ready yet returned to work he would not be able to use crutches in the workplace and is not yet even comfortable ambulating in the home.   He complains of continued pain and swelling he is wearing a neoprene sleeve during the day and he states that this does help provide relief during the day he has been using ibuprofen 800 mg with good relief.  Assessment & Plan: Visit Diagnoses:  No diagnosis found.  Plan: Provided knee exercise program.  He would like to begin with some home therapy.  Offered physical therapy patient declined at this time.  Discussed possibility of MRI evaluating for meniscal injury patient would like to hold off at this time.  We will have patient follow-up in 2 weeks to evaluate for return to work he currently works for the [employer] and will need working restrictions most likely maximum standing 2 hours a day with maximum lifting 20 pounds no squatting or stooping.  Follow-Up Instructions: Return in about 2 weeks (around 12/07/2019).   Ortho Exam  Patient is alert, oriented, no adenopathy, well-dressed, normal affect, normal respiratory effort. Examination patient has a tense effusion of the left knee he has full active extension no extensor  mechanism injury collaterals and cruciates are stable medial lateral joint line are minimally tender to palpation.  Patient had 80 cc of clear synovial fluid aspirated no signs of meniscal or ligamentous tear.  Imaging: No results found. No images are attached to the encounter.  Labs: Lab Results  Component Value Date   REPTSTATUS 10/31/2019 FINAL 10/27/2019   GRAMSTAIN  10/27/2019    ABUNDANT WBC PRESENT,BOTH PMN AND MONONUCLEAR NO ORGANISMS SEEN Gram Stain Report Called to,Read Back By and Verified With: T.SMITH AT 1828 ON 10/27/19 BY N.THOMPSON Performed at [facility], [address], [place], [place] [address]    CULT  10/27/2019    NO GROWTH 3 DAYS Performed at [facility], [address], [place], [place] [address]      Lab Results  Component Value Date   ALBUMIN 4.3 07/24/2016   ALBUMIN 4.2 01/10/2016   ALBUMIN 4.0 09/06/2015    No results found for: MG No results found for: VD25OH  No results found for: PREALBUMIN CBC EXTENDED Latest Ref Rng & Units 11/02/2019 06/22/2019 11/25/2018  WBC 3.8 - 10.8 Thousand/uL 6.9 4.2 3.9  RBC 4.20 - 5.80 Million/uL 4.77 4.57 4.31  HGB 13.2 - 17.1 g/dL 00.1 74.9 44.9  HCT 38 - 50 % 41.0 40.8 38.3(L)  PLT 140 - 400  Thousand/uL 436(H) 268 228  NEUTROABS 1,500 - 7,800 cells/uL - 1,604 1,459(L)  LYMPHSABS 850 - 3,900 cells/uL - 1,739 1,630     Body mass index is 27.62 kg/m.  Orders:  No orders of the defined types were placed in this encounter.  No orders of the defined types were placed in this encounter.    Procedures: No procedures performed  Clinical Data: No additional findings.  ROS:  All other systems negative, except as noted in the HPI. Review of Systems  Constitutional: Negative for chills and fever.  Musculoskeletal: Positive for arthralgias and gait problem.    Objective: Vital Signs: Ht 5\' 9"  (1.753 m)   Wt 187 lb (84.8 kg)   BMI 27.62 kg/m   Specialty Comments:  No  specialty comments available.  PMFS History: Patient Active Problem List   Diagnosis Date Noted  . Influenza due to influenza virus, type B 03/23/2018  . HIV disease (HCC) 08/13/2017  . Acute renal insufficiency 02/10/2017  . Anal wart 08/07/2016  . Internal hemorrhoid 08/07/2016  . External hemorrhoid 08/07/2016  . Syphilis 09/20/2015  . Panic attack 08/10/2014  . Generalized anxiety disorder 08/10/2014  . PCP (pneumocystis jiroveci pneumonia) (HCC)   . Lumbago 10/14/2012  . Deep venous thrombosis (HCC)   . Weight gain 07/10/2011  . Seasonal allergies 06/28/2011  . Joint pain 02/21/2011  . Folic acid deficiency 07/31/2010  . Long term current use of anticoagulant 04/07/2010  . AIDS (HCC) 01/01/2010  . PNEUMOCYSTIS PNEUMONIA 01/01/2010  . 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 DVT 12/22/2009  . PE 12/16/2009   Past Medical History:  Diagnosis Date  . Acute renal insufficiency 02/10/2017  . Anal wart 08/07/2016  . Anemia   . Anxiety   . Deep venous thrombosis (HCC)   . Depression   . External hemorrhoid 08/07/2016  . Generalized anxiety disorder 08/10/2014  . HIV disease (HCC) 08/13/2017  . Internal hemorrhoid 08/07/2016  . Panic attack 08/10/2014  . PCP (pneumocystis jiroveci pneumonia) (HCC)   . Syphilis 09/20/2015    Family History  Problem Relation Age of Onset  . Kidney disease Maternal Grandmother   . Cancer Neg Hx   . Diabetes Neg Hx     History reviewed. No pertinent surgical history. Social History   Occupational History  . Not on file  Tobacco Use  . Smoking status: Never Smoker  . Smokeless tobacco: Never Used  Vaping Use  . Vaping Use: Never used  Substance and Sexual Activity  . Alcohol use: Yes    Alcohol/week: 1.0 standard drink    Types: 1 Standard drinks or equivalent per week    Comment: wine  . Drug use: Yes    Types: Marijuana  . Sexual activity: Not Currently    Partners: Male    Comment: pt givne condoms

## 2019-12-07 ENCOUNTER — Ambulatory Visit (INDEPENDENT_AMBULATORY_CARE_PROVIDER_SITE_OTHER): Payer: 59 | Admitting: Physician Assistant

## 2019-12-07 ENCOUNTER — Encounter: Payer: Self-pay | Admitting: Physician Assistant

## 2019-12-07 VITALS — Ht 69.0 in | Wt 187.0 lb

## 2019-12-07 DIAGNOSIS — M25462 Effusion, left knee: Secondary | ICD-10-CM | POA: Diagnosis not present

## 2019-12-07 NOTE — Progress Notes (Signed)
Office Visit Note   Patient: Lance Craig           Date of Birth: February 13, 1981           MRN: [id number] Visit Date: 12/07/2019              Requested by: [place], Betty G, MD [address] [place],  [place] [address] PCP: [place], Betty G, MD  Chief Complaint  Patient presents with  . Left Knee - Follow-up    S/p injection 11/15/19      HPI: Patient is a 39 year old gentleman who was 2 months s/p direct fall onto his left knee.  He had a significant effusion at that time of serous fluid 80 cc.  He is doing somewhat better but still complains of pain in his knee and is using a knee brace.  He would like to go back to work at the [employer] but must do light duty  Assessment & Plan: Visit Diagnoses:  1. Effusion, left knee     Plan: We will allow him for light duty.  He will be following paperwork to [place] as well.  I said no more standing walking and 4 hours/day.  No lifting more than 7 pounds.  No twisting stooping or kneeling.  I do think it has been 2 months and he still has some fluid in his knee I would recommend an MRI to rule out any internal derangement we also discussed use of anti-inflammatories and their appropriate amounts.  Certainly he can substitute Tylenol if he is concerned  Follow-Up Instructions: No follow-ups on file.   Ortho Exam  Patient is alert, oriented, no adenopathy, well-dressed, normal affect, normal respiratory effort. Left knee: No cellulitis.  Does have a mild effusion.  Mildly tender over the joint line.  Some difficulty with full extension.  No evidence of any infective process.  Imaging: No results found. No images are attached to the encounter.  Labs: Lab Results  Component Value Date   REPTSTATUS 10/31/2019 FINAL 10/27/2019   GRAMSTAIN  10/27/2019    ABUNDANT WBC PRESENT,BOTH PMN AND MONONUCLEAR NO ORGANISMS SEEN Gram Stain Report Called to,Read Back By and Verified With: T.SMITH AT 1828 ON 10/27/19 BY N.THOMPSON Performed at  [facility], [address] [place], [place] [address]    CULT  10/27/2019    NO GROWTH 3 DAYS Performed at [facility], [address], [place], [place] [address]      Lab Results  Component Value Date   ALBUMIN 4.3 07/24/2016   ALBUMIN 4.2 01/10/2016   ALBUMIN 4.0 09/06/2015    No results found for: MG No results found for: VD25OH  No results found for: PREALBUMIN CBC EXTENDED Latest Ref Rng & Units 11/02/2019 06/22/2019 11/25/2018  WBC 3.8 - 10.8 Thousand/uL 6.9 4.2 3.9  RBC 4.20 - 5.80 Million/uL 4.77 4.57 4.31  HGB 13.2 - 17.1 g/dL 99.8 33.8 25.0  HCT 38 - 50 % 41.0 40.8 38.3(L)  PLT 140 - 400 Thousand/uL 436(H) 268 228  NEUTROABS 1,500 - 7,800 cells/uL - 1,604 1,459(L)  LYMPHSABS 850 - 3,900 cells/uL - 1,739 1,630     Body mass index is 27.62 kg/m.  Orders:  Orders Placed This Encounter  Procedures  . MR Knee Left w/o contrast   No orders of the defined types were placed in this encounter.    Procedures: No procedures performed  Clinical Data: No additional findings.  ROS:  All other systems negative,  except as noted in the HPI. Review of Systems  Objective: Vital Signs: Ht 5\' 9"  (1.753 m)   Wt 187 lb (84.8 kg)   BMI 27.62 kg/m   Specialty Comments:  No specialty comments available.  PMFS History: Patient Active Problem List   Diagnosis Date Noted  . Influenza due to influenza virus, type B 03/23/2018  . HIV disease (HCC) 08/13/2017  . Acute renal insufficiency 02/10/2017  . Anal wart 08/07/2016  . Internal hemorrhoid 08/07/2016  . External hemorrhoid 08/07/2016  . Syphilis 09/20/2015  . Panic attack 08/10/2014  . Generalized anxiety disorder 08/10/2014  . PCP (pneumocystis jiroveci pneumonia) (HCC)   . Lumbago 10/14/2012  . Deep venous thrombosis (HCC)   . Weight gain 07/10/2011  . Seasonal allergies 06/28/2011  . Joint pain 02/21/2011  . Folic acid deficiency 07/31/2010  . Long term current use of  anticoagulant 04/07/2010  . AIDS (HCC) 01/01/2010  . PNEUMOCYSTIS PNEUMONIA 01/01/2010  . 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 DVT 12/22/2009  . PE 12/16/2009   Past Medical History:  Diagnosis Date  . Acute renal insufficiency 02/10/2017  . Anal wart 08/07/2016  . Anemia   . Anxiety   . Deep venous thrombosis (HCC)   . Depression   . External hemorrhoid 08/07/2016  . Generalized anxiety disorder 08/10/2014  . HIV disease (HCC) 08/13/2017  . Internal hemorrhoid 08/07/2016  . Panic attack 08/10/2014  . PCP (pneumocystis jiroveci pneumonia) (HCC)   . Syphilis 09/20/2015    Family History  Problem Relation Age of Onset  . Kidney disease Maternal Grandmother   . Cancer Neg Hx   . Diabetes Neg Hx     No past surgical history on file. Social History   Occupational History  . Not on file  Tobacco Use  . Smoking status: Never Smoker  . Smokeless tobacco: Never Used  Vaping Use  . Vaping Use: Never used  Substance and Sexual Activity  . Alcohol use: Yes    Alcohol/week: 1.0 standard drink    Types: 1 Standard drinks or equivalent per week    Comment: wine  . Drug use: Yes    Types: Marijuana  . Sexual activity: Not Currently    Partners: Male    Comment: pt givne condoms

## 2019-12-13 ENCOUNTER — Other Ambulatory Visit: Payer: Self-pay

## 2019-12-13 ENCOUNTER — Encounter: Payer: Self-pay | Admitting: Family Medicine

## 2019-12-13 ENCOUNTER — Ambulatory Visit (INDEPENDENT_AMBULATORY_CARE_PROVIDER_SITE_OTHER): Payer: 59 | Admitting: Family Medicine

## 2019-12-13 VITALS — BP 130/80 | HR 79 | Resp 16 | Ht 69.0 in | Wt 177.0 lb

## 2019-12-13 DIAGNOSIS — R202 Paresthesia of skin: Secondary | ICD-10-CM | POA: Diagnosis not present

## 2019-12-13 DIAGNOSIS — M25562 Pain in left knee: Secondary | ICD-10-CM

## 2019-12-13 DIAGNOSIS — R200 Anesthesia of skin: Secondary | ICD-10-CM

## 2019-12-13 DIAGNOSIS — F411 Generalized anxiety disorder: Secondary | ICD-10-CM | POA: Diagnosis not present

## 2019-12-13 NOTE — Assessment & Plan Note (Signed)
Instructed to take Sertraline 25 mg daily. It is a low dose some he can try to increase Sertraline from 25 mg to 50 mg in a few weeks,he will let me know. 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 I will complete FMLA for anxiety to cover 6-8 weeks while med starts to work.

## 2019-12-13 NOTE — Patient Instructions (Addendum)
A few things to remember from today's visit:  Anxiety disorder, unspecified type  Numbness and tingling sensation of skin  If you need refills please call your pharmacy. Do not use My Chart to request refills or for acute issues that need immediate attention.   Start Sertraline 25 mg daily. We will nee to follow in 6 week, it can be virtual. Psychotherapy may help.  If FMLA is needed for knee issues ortho is the most appropriate to complete them. If anxiety starts affecting your job, you need to consider establishing with psychiatrist.  Please be sure medication list is accurate. If a new problem present, please set up appointment sooner than planned today.

## 2019-12-13 NOTE — Progress Notes (Signed)
HPI: Lance Craig is a 39 y.o. male, who is here today to follow on recent OV.  He was seen on 11/02/19,when he was c/o anxiety. Sertraline 25 mg was recommended. He is not taking Sertraline daily but 2-3 times per week. He forgets sometimes and when at home, where he feels more comfortable and relax he has chosen not to take it. He is back to work. Requesting FMLA completed,so he can leave work or being excuse due to anxiety. No depressed mood.  Frustrated because left knee pain. He is following with ortho. Knee pain and edema have improved. He is wearing a brace. Pending left knee MRI ordered by his ortho.  Last visit he was also c/o numbness and tingling,these have resolved.  Review of Systems  Constitutional: Negative for activity change, appetite change and fever.  HENT: Negative for nosebleeds, sore throat and trouble swallowing.   Respiratory: Negative for cough, shortness of breath and wheezing.   Cardiovascular: Negative for chest pain, palpitations and leg swelling.  Gastrointestinal: Negative for abdominal pain, nausea and vomiting.  Musculoskeletal: Positive for arthralgias.  Neurological: Negative for syncope, weakness and headaches.  Psychiatric/Behavioral: Negative for confusion. The patient is nervous/anxious.   Rest see pertinent positives and negatives per HPI.  Current Outpatient Medications on File Prior to Visit  Medication Sig Dispense Refill  . abacavir-dolutegravir-lamiVUDine (TRIUMEQ) 600-50-300 MG tablet Take 1 tablet by mouth daily. 30 tablet 11  . ibuprofen (ADVIL) 800 MG tablet Take 1 tablet (800 mg total) by mouth every 8 (eight) hours as needed. 60 tablet 0  . loratadine (CLARITIN) 10 MG tablet Take 10 mg by mouth daily as needed for allergies.    Marland Kitchen sertraline (ZOLOFT) 25 MG tablet Take 1 tablet (25 mg total) by mouth daily. 30 tablet 1   No current facility-administered medications on file prior to visit.     Past Medical History:    Diagnosis Date  . Acute renal insufficiency 02/10/2017  . Anal wart 08/07/2016  . Anemia   . Anxiety   . Deep venous thrombosis (HCC)   . Depression   . External hemorrhoid 08/07/2016  . Generalized anxiety disorder 08/10/2014  . HIV disease (HCC) 08/13/2017  . Internal hemorrhoid 08/07/2016  . Panic attack 08/10/2014  . PCP (pneumocystis jiroveci pneumonia) (HCC)   . Syphilis 09/20/2015   Allergies  Allergen Reactions  . Zithromax [Azithromycin]     Developed a DVT and was told likely for this abx--this MAKES NO SENSE to me-- [name]    Social History   Socioeconomic History  . Marital status: Single    Spouse name: Not on file  . Number of children: Not on file  . Years of education: Not on file  . Highest education level: Not on file  Occupational History  . Not on file  Tobacco Use  . Smoking status: Never Smoker  . Smokeless tobacco: Never Used  Vaping Use  . Vaping Use: Never used  Substance and Sexual Activity  . Alcohol use: Yes    Alcohol/week: 1.0 standard drink    Types: 1 Standard drinks or equivalent per week    Comment: wine  . Drug use: Yes    Types: Marijuana  . Sexual activity: Not Currently    Partners: Male    Comment: pt givne condoms  Other Topics Concern  . Not on file  Social History Narrative  . Not on file   Social Determinants of Health   Financial Resource  Strain:   . Difficulty of Paying Living Expenses: Not on file  Food Insecurity:   . Worried About Programme researcher, broadcasting/film/video in the Last Year: Not on file  . Ran Out of Food in the Last Year: Not on file  Transportation Needs:   . Lack of Transportation (Medical): Not on file  . Lack of Transportation (Non-Medical): Not on file  Physical Activity:   . Days of Exercise per Week: Not on file  . Minutes of Exercise per Session: Not on file  Stress:   . Feeling of Stress : Not on file  Social Connections:   . Frequency of Communication with Friends and Family: Not on file  .  Frequency of Social Gatherings with Friends and Family: Not on file  . Attends Religious Services: Not on file  . Active Member of Clubs or Organizations: Not on file  . Attends Banker Meetings: Not on file  . Marital Status: Not on file    Vitals:   12/13/19 0903  BP: 130/80  Pulse: 79  Resp: 16  SpO2: 99%   Body mass index is 26.14 kg/m.  Physical Exam Vitals and nursing note reviewed.  Constitutional:      General: He is not in acute distress.    Appearance: He is well-developed.  HENT:     Head: Atraumatic.     Mouth/Throat:     Mouth: Mucous membranes are moist.     Pharynx: Oropharynx is clear.  Eyes:     Conjunctiva/sclera: Conjunctivae normal.  Cardiovascular:     Rate and Rhythm: Normal rate and regular rhythm.     Heart sounds: No murmur heard.   Pulmonary:     Effort: Pulmonary effort is normal. No respiratory distress.     Breath sounds: Normal breath sounds.  Abdominal:     Palpations: Abdomen is soft. There is no hepatomegaly or mass.     Tenderness: There is no abdominal tenderness.  Musculoskeletal:     Comments: Antalgic gait.  Skin:    General: Skin is warm.     Findings: No erythema.  Neurological:     General: No focal deficit present.     Mental Status: He is alert and oriented to person, place, and time.  Psychiatric:        Mood and Affect: Affect normal. Mood is anxious.        Thought Content: Thought content does not include suicidal ideation.    ASSESSMENT AND PLAN:  Lance Craig was seen today for follow-up.  Diagnoses and all orders for this visit:  Left knee pain, unspecified chronicity Improving. If any procedure is needed or pain is interfering with work, he can discussed FMLA with his ortho.  Generalized anxiety disorder Instructed to take Sertraline 25 mg daily. It is a low dose some he can try to increase Sertraline from 25 mg to 50 mg in a few weeks,he will let me know. 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 I will complete FMLA for anxiety to cover 6-8 weeks while med starts to work.   Numbness and tingling sensation of skin Resolved. Continue monitoring for recurrence and/or new symptoms.   Return in about 8 weeks (around 02/07/2020) for Can be virtual.  Haeli Gerlich G. [place], MD  [facility]. [place] [facility].  A few things to remember from today's visit:  Anxiety disorder, unspecified type  Numbness and tingling sensation of skin  If you need refills  please call your pharmacy. Do not use My Chart to request refills or for acute issues that need immediate attention.   Start Sertraline 25 mg daily. We will nee to follow in 6 week, it can be virtual. Psychotherapy may help.  If FMLA is needed for knee issues ortho is the most appropriate to complete them. If anxiety starts affecting your job, you need to consider establishing with psychiatrist.  Please be sure medication list is accurate. If a new problem present, please set up appointment sooner than planned today.

## 2019-12-13 NOTE — Assessment & Plan Note (Signed)
Resolved. Continue monitoring for recurrence and/or new symptoms.

## 2019-12-16 ENCOUNTER — Encounter: Payer: Self-pay | Admitting: Family Medicine

## 2019-12-22 ENCOUNTER — Ambulatory Visit: Payer: 59 | Admitting: Infectious Disease

## 2020-01-01 ENCOUNTER — Other Ambulatory Visit: Payer: Self-pay

## 2020-01-01 ENCOUNTER — Ambulatory Visit
Admission: RE | Admit: 2020-01-01 | Discharge: 2020-01-01 | Disposition: A | Discharge status: Home / Self Care | Payer: 59 | Source: Ambulatory Visit | Attending: Physician Assistant | Admitting: Physician Assistant

## 2020-01-01 DIAGNOSIS — M25462 Effusion, left knee: Secondary | ICD-10-CM

## 2020-01-03 ENCOUNTER — Other Ambulatory Visit: Payer: Self-pay | Admitting: Family Medicine

## 2020-01-03 DIAGNOSIS — F419 Anxiety disorder, unspecified: Secondary | ICD-10-CM

## 2020-01-04 ENCOUNTER — Telehealth: Payer: Self-pay | Admitting: Orthopedic Surgery

## 2020-01-04 NOTE — Telephone Encounter (Signed)
Received vm from pt stating he needs his forms by Friday and would like to have copy at the time of his appt Thursday. IC,lmvm advised forms are just awaiting signature and will likely have ready by Thursday.

## 2020-01-06 ENCOUNTER — Ambulatory Visit (INDEPENDENT_AMBULATORY_CARE_PROVIDER_SITE_OTHER): Payer: 59 | Admitting: Orthopedic Surgery

## 2020-01-06 ENCOUNTER — Encounter: Payer: Self-pay | Admitting: Orthopedic Surgery

## 2020-01-06 VITALS — Ht 69.0 in | Wt 177.0 lb

## 2020-01-06 DIAGNOSIS — M25562 Pain in left knee: Secondary | ICD-10-CM

## 2020-01-06 NOTE — Progress Notes (Signed)
Office Visit Note   Patient: Lance Craig           Date of Birth: 1980/08/16           MRN: [id number] Visit Date: 01/06/2020              Requested by: [place], Betty G, MD [address] [place],  [place] [address] PCP: [place], Betty G, MD  Chief Complaint  Patient presents with  . Left Knee - Follow-up    MRI review     As yet.  HPI: Patient is a 39 year old gentleman who presents in follow-up status post MRI scan left knee status post blunt trauma.  Patient states that he is currently on light duty work for the past 30 days and he states he does not feel like he can return to full active duty  Patient states that he currently does not have any pain in his knee he states he cannot walk as fast as he normally does.  Assessment & Plan: Visit Diagnoses:  1. Acute pain of left knee     Plan: We will complete paperwork stating the patient cannot perform his normal work duties that we would keep him on an additional 60 days of work restriction as per his current restrictions.  Discussed that he does not need surgical intervention and that the symptoms should continue to resolve uneventfully.  Follow-Up Instructions: Return if symptoms worsen or fail to improve.   Ortho Exam  Patient is alert, oriented, no adenopathy, well-dressed, normal affect, normal respiratory effort. Examination patient has a normal gait there is no effusion collaterals and cruciates are stable.  Review of the MRI scan shows no ligamentous or meniscal injury.  He does have a contusion of the medial femoral condyle medial tibial plateau and lateral patella no fractures.  Imaging: No results found. No images are attached to the encounter.  Labs: Lab Results  Component Value Date   REPTSTATUS 10/31/2019 FINAL 10/27/2019   GRAMSTAIN  10/27/2019    ABUNDANT WBC PRESENT,BOTH PMN AND MONONUCLEAR NO ORGANISMS SEEN Gram Stain Report Called to,Read Back By and Verified With: T.SMITH AT 1828 ON  10/27/19 BY N.THOMPSON Performed at [facility], [address] [place], [place] [address]    CULT  10/27/2019    NO GROWTH 3 DAYS Performed at [facility], [address], [place], [place] [address]      Lab Results  Component Value Date   ALBUMIN 4.3 07/24/2016   ALBUMIN 4.2 01/10/2016   ALBUMIN 4.0 09/06/2015    No results found for: MG No results found for: VD25OH  No results found for: PREALBUMIN CBC EXTENDED Latest Ref Rng & Units 11/02/2019 06/22/2019 11/25/2018  WBC 3.8 - 10.8 Thousand/uL 6.9 4.2 3.9  RBC 4.20 - 5.80 Million/uL 4.77 4.57 4.31  HGB 13.2 - 17.1 g/dL 84.1 66.0 63.0  HCT 38 - 50 % 41.0 40.8 38.3(L)  PLT 140 - 400 Thousand/uL 436(H) 268 228  NEUTROABS 1,500 - 7,800 cells/uL - 1,604 1,459(L)  LYMPHSABS 850 - 3,900 cells/uL - 1,739 1,630     Body mass index is 26.14 kg/m.  Orders:  No orders of the defined types were placed in this encounter.  No orders of the defined types were placed in this encounter.    Procedures: No procedures performed  Clinical Data: No additional findings.  ROS:  All other systems negative, except as noted in the HPI. Review of Systems  Objective: Vital Signs:  Ht 5\' 9"  (1.753 m)   Wt 177 lb (80.3 kg)   BMI 26.14 kg/m   Specialty Comments:  No specialty comments available.  PMFS History: Patient Active Problem List   Diagnosis Date Noted  . Numbness and tingling sensation of skin 12/13/2019  . Influenza due to influenza virus, type B 03/23/2018  . HIV disease (HCC) 08/13/2017  . Acute renal insufficiency 02/10/2017  . Anal wart 08/07/2016  . Internal hemorrhoid 08/07/2016  . External hemorrhoid 08/07/2016  . Syphilis 09/20/2015  . Panic attack 08/10/2014  . Generalized anxiety disorder 08/10/2014  . PCP (pneumocystis jiroveci pneumonia) (HCC)   . Lumbago 10/14/2012  . Deep venous thrombosis (HCC)   . Weight gain 07/10/2011  . Seasonal allergies 06/28/2011  . Joint pain  02/21/2011  . Folic acid deficiency 07/31/2010  . Long term current use of anticoagulant 04/07/2010  . AIDS (HCC) 01/01/2010  . PNEUMOCYSTIS PNEUMONIA 01/01/2010  . 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 DVT 12/22/2009  . PE 12/16/2009   Past Medical History:  Diagnosis Date  . Acute renal insufficiency 02/10/2017  . Anal wart 08/07/2016  . Anemia   . Anxiety   . Deep venous thrombosis (HCC)   . Depression   . External hemorrhoid 08/07/2016  . Generalized anxiety disorder 08/10/2014  . HIV disease (HCC) 08/13/2017  . Internal hemorrhoid 08/07/2016  . Panic attack 08/10/2014  . PCP (pneumocystis jiroveci pneumonia) (HCC)   . Syphilis 09/20/2015    Family History  Problem Relation Age of Onset  . Kidney disease Maternal Grandmother   . Cancer Neg Hx   . Diabetes Neg Hx     History reviewed. No pertinent surgical history. Social History   Occupational History  . Not on file  Tobacco Use  . Smoking status: Never Smoker  . Smokeless tobacco: Never Used  Vaping Use  . Vaping Use: Never used  Substance and Sexual Activity  . Alcohol use: Yes    Alcohol/week: 1.0 standard drink    Types: 1 Standard drinks or equivalent per week    Comment: wine  . Drug use: Yes    Types: Marijuana  . Sexual activity: Not Currently    Partners: Male    Comment: pt givne condoms

## 2020-01-18 ENCOUNTER — Encounter: Payer: Self-pay | Admitting: Infectious Disease

## 2020-01-18 ENCOUNTER — Ambulatory Visit (INDEPENDENT_AMBULATORY_CARE_PROVIDER_SITE_OTHER): Payer: 59 | Admitting: Infectious Disease

## 2020-01-18 ENCOUNTER — Other Ambulatory Visit (HOSPITAL_COMMUNITY)
Admission: RE | Admit: 2020-01-18 | Discharge: 2020-01-18 | Disposition: A | Discharge status: Home / Self Care | Payer: 59 | Source: Ambulatory Visit | Attending: Infectious Disease | Admitting: Infectious Disease

## 2020-01-18 ENCOUNTER — Other Ambulatory Visit: Payer: Self-pay

## 2020-01-18 VITALS — BP 138/83 | HR 63 | Wt 182.0 lb

## 2020-01-18 DIAGNOSIS — A539 Syphilis, unspecified: Secondary | ICD-10-CM

## 2020-01-18 DIAGNOSIS — T148XXA Other injury of unspecified body region, initial encounter: Secondary | ICD-10-CM

## 2020-01-18 DIAGNOSIS — S8012XA Contusion of left lower leg, initial encounter: Secondary | ICD-10-CM | POA: Diagnosis not present

## 2020-01-18 DIAGNOSIS — B20 Human immunodeficiency virus [HIV] disease: Secondary | ICD-10-CM

## 2020-01-18 DIAGNOSIS — Z23 Encounter for immunization: Secondary | ICD-10-CM

## 2020-01-18 HISTORY — DX: Other injury of unspecified body region, initial encounter: T14.8XXA

## 2020-01-18 NOTE — Progress Notes (Signed)
Chief complaint: followup for HIV medications  Subjective:    Patient ID: Lance Craig, male    DOB: Apr 30, 1980, 39 y.o.   MRN: [id number]  HPI  Lance Craig is a 39 y.o. male with history of HIV/AIDS w initially treated with Atripla eventually changed over to Triumeq which she is maintained perfect virological suppression.  Is not have hepatitis B coinfection.  We discussed changing him to Dovato to get rid of the abacavir versus changing him to [facility] but he was not eager to switch medications   I again suggested switch to Dovato versus Biktarvy but he still would like to stay on the Triumeq.  He did sustain an injury to his left leg with a contusion is being followed by Dr. Lajoyce Corners.  Past Medical History:  Diagnosis Date  . Acute renal insufficiency 02/10/2017  . Anal wart 08/07/2016  . Anemia   . Anxiety   . Deep venous thrombosis (HCC)   . Depression   . External hemorrhoid 08/07/2016  . Generalized anxiety disorder 08/10/2014  . HIV disease (HCC) 08/13/2017  . Internal hemorrhoid 08/07/2016  . Panic attack 08/10/2014  . PCP (pneumocystis jiroveci pneumonia) (HCC)   . Syphilis 09/20/2015    No past surgical history on file.  Family History  Problem Relation Age of Onset  . Kidney disease Maternal Grandmother   . Cancer Neg Hx   . Diabetes Neg Hx       Social History   Socioeconomic History  . Marital status: Single    Spouse name: Not on file  . Number of children: Not on file  . Years of education: Not on file  . Highest education level: Not on file  Occupational History  . Not on file  Tobacco Use  . Smoking status: Never Smoker  . Smokeless tobacco: Never Used  Vaping Use  . Vaping Use: Never used  Substance and Sexual Activity  . Alcohol use: Yes    Alcohol/week: 1.0 standard drink    Types: 1 Standard drinks or equivalent per week    Comment: wine  . Drug use: Yes    Types: Marijuana  . Sexual activity: Not Currently    Partners: Male    Comment:  pt givne condoms  Other Topics Concern  . Not on file  Social History Narrative  . Not on file   Social Determinants of Health   Financial Resource Strain:   . Difficulty of Paying Living Expenses: Not on file  Food Insecurity:   . Worried About Programme researcher, broadcasting/film/video in the Last Year: Not on file  . Ran Out of Food in the Last Year: Not on file  Transportation Needs:   . Lack of Transportation (Medical): Not on file  . Lack of Transportation (Non-Medical): Not on file  Physical Activity:   . Days of Exercise per Week: Not on file  . Minutes of Exercise per Session: Not on file  Stress:   . Feeling of Stress : Not on file  Social Connections:   . Frequency of Communication with Friends and Family: Not on file  . Frequency of Social Gatherings with Friends and Family: Not on file  . Attends Religious Services: Not on file  . Active Member of Clubs or Organizations: Not on file  . Attends Banker Meetings: Not on file  . Marital Status: Not on file    Allergies  Allergen Reactions  . Zithromax [Azithromycin]     Developed a DVT  and was told likely for this abx--this MAKES NO SENSE to me-- [name]     Current Outpatient Medications:  .  abacavir-dolutegravir-lamiVUDine (TRIUMEQ) 600-50-300 MG tablet, Take 1 tablet by mouth daily., Disp: 30 tablet, Rfl: 11 .  ibuprofen (ADVIL) 800 MG tablet, Take 1 tablet (800 mg total) by mouth every 8 (eight) hours as needed., Disp: 60 tablet, Rfl: 0 .  loratadine (CLARITIN) 10 MG tablet, Take 10 mg by mouth daily as needed for allergies., Disp: , Rfl:  .  sertraline (ZOLOFT) 25 MG tablet, TAKE 1 TABLET(25 MG) BY MOUTH DAILY, Disp: 30 tablet, Rfl: 1         Review of Systems  Constitutional: Negative for activity change, appetite change, chills, diaphoresis, fatigue, fever and unexpected weight change.  HENT: Negative for congestion, rhinorrhea, sinus pressure, sneezing, sore throat and trouble swallowing.   Eyes:  Negative for photophobia and visual disturbance.  Respiratory: Negative for cough, chest tightness, shortness of breath, wheezing and stridor.   Cardiovascular: Negative for chest pain, palpitations and leg swelling.  Gastrointestinal: Negative for abdominal distention, abdominal pain, anal bleeding, blood in stool, constipation, diarrhea, nausea, rectal pain and vomiting.  Genitourinary: Negative for difficulty urinating, dysuria, flank pain and hematuria.  Musculoskeletal: Positive for arthralgias and myalgias. Negative for gait problem and joint swelling.  Skin: Negative for color change, pallor, rash and wound.  Neurological: Negative for dizziness, tremors, seizures, weakness and light-headedness.  Hematological: Negative for adenopathy. Does not bruise/bleed easily.  Psychiatric/Behavioral: Negative for behavioral problems, confusion, decreased concentration, dysphoric mood, sleep disturbance and suicidal ideas. The patient is not nervous/anxious.        Objective:   Physical Exam Vitals and nursing note reviewed.  Constitutional:      General: He is not in acute distress.    Appearance: He is well-developed. He is not diaphoretic.  HENT:     Head: Normocephalic and atraumatic.     Mouth/Throat:     Pharynx: No oropharyngeal exudate.  Eyes:     General: No scleral icterus.    Conjunctiva/sclera: Conjunctivae normal.     Pupils: Pupils are equal, round, and reactive to light.  Neck:     Vascular: No JVD.  Cardiovascular:     Rate and Rhythm: Normal rate and regular rhythm.  Pulmonary:     Effort: Pulmonary effort is normal. No respiratory distress.     Breath sounds: No wheezing.  Abdominal:     General: There is no distension.  Musculoskeletal:        General: No tenderness or deformity.     Cervical back: Normal range of motion and neck supple.  Lymphadenopathy:     Cervical: No cervical adenopathy.  Skin:    General: Skin is warm and dry.     Coloration: Skin is not  pale.     Findings: No erythema.  Neurological:     Mental Status: He is alert and oriented to person, place, and time.     Motor: No abnormal muscle tone.     Coordination: Coordination normal.     Deep Tendon Reflexes: Reflexes are normal and symmetric.  Psychiatric:        Mood and Affect: Mood is not anxious or depressed.        Behavior: Behavior normal.        Thought Content: Thought content normal.        Judgment: Judgment normal.           Assessment &  Plan:    #1 HIV disease with hx of AIDS: Continue TRIUMEQ for now  Return to clinic in 6 months  2.  Syphilis titers are stable  3 Contusion : Following with Dr. Lajoyce Corners

## 2020-01-19 LAB — T-HELPER CELL (CD4) - (RCID CLINIC ONLY)
CD4 % Helper T Cell: 28 % — ABNORMAL LOW (ref 33–65)
CD4 T Cell Abs: 576 /uL (ref 400–1790)

## 2020-01-19 LAB — URINE CYTOLOGY ANCILLARY ONLY
Chlamydia: NEGATIVE
Comment: NEGATIVE
Comment: NORMAL
Neisseria Gonorrhea: NEGATIVE

## 2020-01-20 LAB — LIPID PANEL
Cholesterol: 175 mg/dL (ref <200)
HDL: 63 mg/dL (ref >=40)
LDL Cholesterol (Calc): 87 mg/dL (calc)
Non-HDL Cholesterol (Calc): 112 mg/dL (calc) (ref <130)
Total CHOL/HDL Ratio: 2.8 (calc) (ref <5.0)
Triglycerides: 151 mg/dL — ABNORMAL HIGH (ref <150)

## 2020-01-20 LAB — CBC WITH DIFFERENTIAL/PLATELET
Absolute Monocytes: 651 cells/uL (ref 200–950)
Basophils Absolute: 62 cells/uL (ref 0–200)
Basophils Relative: 1.4 %
Eosinophils Absolute: 312 cells/uL (ref 15–500)
Eosinophils Relative: 7.1 %
HCT: 36.6 % — ABNORMAL LOW (ref 38.5–50.0)
Hemoglobin: 12.5 g/dL — ABNORMAL LOW (ref 13.2–17.1)
Lymphs Abs: 2055 cells/uL (ref 850–3900)
MCH: 30.7 pg (ref 27.0–33.0)
MCHC: 34.2 g/dL (ref 32.0–36.0)
MCV: 89.9 fL (ref 80.0–100.0)
MPV: 9.8 fL (ref 7.5–12.5)
Monocytes Relative: 14.8 %
Neutro Abs: 1320 cells/uL — ABNORMAL LOW (ref 1500–7800)
Neutrophils Relative %: 30 %
Platelets: 241 10*3/uL (ref 140–400)
RBC: 4.07 10*6/uL — ABNORMAL LOW (ref 4.20–5.80)
RDW: 13.6 % (ref 11.0–15.0)
Total Lymphocyte: 46.7 %
WBC: 4.4 10*3/uL (ref 3.8–10.8)

## 2020-01-20 LAB — COMPLETE METABOLIC PANEL WITH GFR
AG Ratio: 1.7 (calc) (ref 1.0–2.5)
ALT: 46 U/L (ref 9–46)
AST: 24 U/L (ref 10–40)
Albumin: 4.3 g/dL (ref 3.6–5.1)
Alkaline phosphatase (APISO): 76 U/L (ref 36–130)
BUN: 11 mg/dL (ref 7–25)
CO2: 27 mmol/L (ref 20–32)
Calcium: 9.4 mg/dL (ref 8.6–10.3)
Chloride: 105 mmol/L (ref 98–110)
Creat: 1.29 mg/dL (ref 0.60–1.35)
GFR, Est African American: 81 mL/min/{1.73_m2} (ref >=60)
GFR, Est Non African American: 70 mL/min/{1.73_m2} (ref >=60)
Globulin: 2.6 g/dL (calc) (ref 1.9–3.7)
Glucose, Bld: 80 mg/dL (ref 65–99)
Potassium: 3.7 mmol/L (ref 3.5–5.3)
Sodium: 141 mmol/L (ref 135–146)
Total Bilirubin: 0.3 mg/dL (ref 0.2–1.2)
Total Protein: 6.9 g/dL (ref 6.1–8.1)

## 2020-01-20 LAB — FLUORESCENT TREPONEMAL AB(FTA)-IGG-BLD: Fluorescent Treponemal ABS: REACTIVE — AB

## 2020-01-20 LAB — RPR: RPR Ser Ql: REACTIVE — AB

## 2020-01-20 LAB — RPR TITER: RPR Titer: 1:2 {titer} — ABNORMAL HIGH

## 2020-01-20 LAB — HIV-1 RNA QUANT-NO REFLEX-BLD
HIV 1 RNA Quant: <20 Copies/mL
HIV-1 RNA Quant, Log: <1.3 Log cps/mL

## 2020-01-24 ENCOUNTER — Telehealth: Payer: 59 | Admitting: Family Medicine

## 2020-02-04 ENCOUNTER — Encounter: Payer: Self-pay | Admitting: Family Medicine

## 2020-02-04 ENCOUNTER — Telehealth (INDEPENDENT_AMBULATORY_CARE_PROVIDER_SITE_OTHER): Payer: 59 | Admitting: Family Medicine

## 2020-02-04 DIAGNOSIS — F419 Anxiety disorder, unspecified: Secondary | ICD-10-CM

## 2020-02-04 MED ORDER — SERTRALINE HCL 25 MG PO TABS: 25.0000 mg | ORAL_TABLET | Freq: Every day | ORAL | 1 refills | Status: DC

## 2020-02-04 NOTE — Progress Notes (Signed)
Virtual Visit via Video Note I connected with Lance Craig on 02/04/20 by a video enabled telemedicine application and verified that I am speaking with the correct person using two identifiers.  Location patient: home Location provider:work office Persons participating in the virtual visit: patient, provider  I discussed the limitations of evaluation and management by telemedicine and the availability of in person appointments. The patient expressed understanding and agreed to proceed.  HPI: Lance Craig is a 39 year old male with history of AIDS, depression, and anxiety following on last visit. He was last seen on 12/13/2019, when he was reporting worsening anxiety. Sertraline 25 mg was started. In general he has tolerated medication well except for mild diarrhea, once daily. He is forgetting to take medication a few times per week, he is usually taking it 3-4 times per week. He is now working third shift.  He feels like medication is helping when he takes it, he does "not worry too much", he feels "calm."  Negative for suicidal thoughts. He is on Triumeq for AIDS, this one he usually takes it as instructed.  ROS: See pertinent positives and negatives per HPI.  Past Medical History:  Diagnosis Date  . Acute renal insufficiency 02/10/2017  . Anal wart 08/07/2016  . Anemia   . Anxiety   . Contusion of bone 01/18/2020  . Deep venous thrombosis (HCC)   . Depression   . External hemorrhoid 08/07/2016  . Generalized anxiety disorder 08/10/2014  . HIV disease (HCC) 08/13/2017  . Internal hemorrhoid 08/07/2016  . Panic attack 08/10/2014  . PCP (pneumocystis jiroveci pneumonia) (HCC)   . Syphilis 09/20/2015   History reviewed. No pertinent surgical history.  Family History  Problem Relation Age of Onset  . Kidney disease Maternal Grandmother   . Cancer Neg Hx   . Diabetes Neg Hx     Social History   Socioeconomic History  . Marital status: Single    Spouse name: Not on file  . Number of  children: Not on file  . Years of education: Not on file  . Highest education level: Not on file  Occupational History  . Not on file  Tobacco Use  . Smoking status: Never Smoker  . Smokeless tobacco: Never Used  Vaping Use  . Vaping Use: Never used  Substance and Sexual Activity  . Alcohol use: Not Currently    Alcohol/week: 1.0 standard drink    Types: 1 Standard drinks or equivalent per week    Comment: wine  . Drug use: Not Currently    Types: Marijuana  . Sexual activity: Not Currently    Partners: Male    Comment: pt givne condoms  Other Topics Concern  . Not on file  Social History Narrative  . Not on file   Social Determinants of Health   Financial Resource Strain:   . Difficulty of Paying Living Expenses: Not on file  Food Insecurity:   . Worried About Programme researcher, broadcasting/film/video in the Last Year: Not on file  . Ran Out of Food in the Last Year: Not on file  Transportation Needs:   . Lack of Transportation (Medical): Not on file  . Lack of Transportation (Non-Medical): Not on file  Physical Activity:   . Days of Exercise per Week: Not on file  . Minutes of Exercise per Session: Not on file  Stress:   . Feeling of Stress : Not on file  Social Connections:   . Frequency of Communication with Friends and Family: Not on  file  . Frequency of Social Gatherings with Friends and Family: Not on file  . Attends Religious Services: Not on file  . Active Member of Clubs or Organizations: Not on file  . Attends Banker Meetings: Not on file  . Marital Status: Not on file  Intimate Partner Violence:   . Fear of Current or Ex-Partner: Not on file  . Emotionally Abused: Not on file  . Physically Abused: Not on file  . Sexually Abused: Not on file    Current Outpatient Medications:  .  abacavir-dolutegravir-lamiVUDine (TRIUMEQ) 600-50-300 MG tablet, Take 1 tablet by mouth daily., Disp: 30 tablet, Rfl: 11 .  loratadine (CLARITIN) 10 MG tablet, Take 10 mg by mouth  daily as needed for allergies., Disp: , Rfl:  .  sertraline (ZOLOFT) 25 MG tablet, Take 1 tablet (25 mg total) by mouth daily., Disp: 90 tablet, Rfl: 1  EXAM:  VITALS per patient if applicable:Ht 5\' 9"  (1.753 m)   BMI 26.88 kg/m   GENERAL: alert, oriented, appears well and in no acute distress  HEENT: atraumatic, conjunctiva clear, no obvious abnormalities on inspection.  NECK: normal movements of the head and neck  LUNGS: on inspection no signs of respiratory distress, breathing rate appears normal, no obvious gross SOB, gasping or wheezing  CV: no obvious cyanosis  PSYCH/NEURO: pleasant and cooperative, no obvious depression or anxiety, speech and thought processing grossly intact  ASSESSMENT AND PLAN: Discussed the following assessment and plan:  Anxiety disorder, unspecified type - Plan: sertraline (ZOLOFT) 25 MG tablet  Problem has improved even though he is not taking sertraline daily. He seems to be compliant with taking his Triumeq, I do not see a contraindication for taking both together but he will also ask infectious disease provided. We discussed some side effects of sertraline, usually GI symptoms resolve after a few weeks. Instructed about warning signs. Follow-up in 3 to 4 months, before if needed.    I discussed the assessment and treatment plan with the patient.  Lance Craig was provided an opportunity to ask questions and all were answered.  He agreed with the plan and demonstrated an understanding of the instructions.  Return in about 4 months (around 06/03/2020) for medication/anxiety.  Tish Begin 06/05/2020, MD

## 2020-02-04 NOTE — Progress Notes (Signed)
Patient is scheduled for 06/05/2020 at 10:30 AM

## 2020-02-15 ENCOUNTER — Telehealth: Payer: Self-pay | Admitting: Orthopedic Surgery

## 2020-02-15 NOTE — Telephone Encounter (Signed)
Patient came in and dropped $25.00 cash for [employer]ML[employer] short term disability payment. Accepted 02/15/2020.

## 2020-03-13 ENCOUNTER — Telehealth: Payer: Self-pay

## 2020-03-13 NOTE — Telephone Encounter (Signed)
Yes that is fine

## 2020-03-13 NOTE — Telephone Encounter (Signed)
Note entered, patient advised. He will come to office to pick up.

## 2020-03-13 NOTE — Telephone Encounter (Signed)
Patient called he stated he needs a note renewed for another 30 days regarding work restrictions. [phone]

## 2020-03-13 NOTE — Telephone Encounter (Signed)
Please advise. OK for note?  

## 2020-04-12 ENCOUNTER — Encounter: Payer: Self-pay | Admitting: Family Medicine

## 2020-06-05 ENCOUNTER — Ambulatory Visit: Payer: 59 | Admitting: Family Medicine

## 2020-06-14 ENCOUNTER — Ambulatory Visit: Payer: 59 | Admitting: Family Medicine

## 2020-07-07 ENCOUNTER — Other Ambulatory Visit: Payer: Self-pay | Admitting: Infectious Disease

## 2020-07-07 DIAGNOSIS — B20 Human immunodeficiency virus [HIV] disease: Secondary | ICD-10-CM

## 2020-07-18 ENCOUNTER — Ambulatory Visit: Payer: 59 | Admitting: Infectious Disease

## 2020-08-10 ENCOUNTER — Other Ambulatory Visit: Payer: Self-pay

## 2020-08-10 DIAGNOSIS — B20 Human immunodeficiency virus [HIV] disease: Secondary | ICD-10-CM

## 2020-08-10 MED ORDER — TRIUMEQ 600-50-300 MG PO TABS: 1.0000 | ORAL_TABLET | Freq: Every day | ORAL | 0 refills | Status: DC

## 2020-08-22 ENCOUNTER — Encounter: Payer: Self-pay | Admitting: Infectious Disease

## 2020-08-22 ENCOUNTER — Other Ambulatory Visit: Payer: Self-pay

## 2020-08-22 ENCOUNTER — Other Ambulatory Visit (HOSPITAL_COMMUNITY)
Admission: RE | Admit: 2020-08-22 | Discharge: 2020-08-22 | Disposition: A | Discharge status: Home / Self Care | Payer: 59 | Source: Ambulatory Visit | Attending: Infectious Disease | Admitting: Infectious Disease

## 2020-08-22 ENCOUNTER — Ambulatory Visit (INDEPENDENT_AMBULATORY_CARE_PROVIDER_SITE_OTHER): Payer: 59 | Admitting: Infectious Disease

## 2020-08-22 VITALS — BP 153/99 | HR 73 | Temp 98.0°F | Wt 192.0 lb

## 2020-08-22 DIAGNOSIS — A539 Syphilis, unspecified: Secondary | ICD-10-CM | POA: Diagnosis not present

## 2020-08-22 DIAGNOSIS — B20 Human immunodeficiency virus [HIV] disease: Secondary | ICD-10-CM

## 2020-08-22 DIAGNOSIS — R635 Abnormal weight gain: Secondary | ICD-10-CM | POA: Diagnosis not present

## 2020-08-22 DIAGNOSIS — I10 Essential (primary) hypertension: Secondary | ICD-10-CM

## 2020-08-22 HISTORY — DX: Essential (primary) hypertension: I10

## 2020-08-22 MED ORDER — DOVATO 50-300 MG PO TABS: 1.0000 | ORAL_TABLET | Freq: Every day | ORAL | 11 refills | Status: DC

## 2020-08-22 NOTE — Progress Notes (Signed)
Chief complaint: followup for HIV medications  Subjective:    Patient ID: Lance Craig, male    DOB: 1981-03-11, 40 y.o.   MRN: [id number]  HPI  Lance Craig is a 40 y.o. male with history of HIV/AIDS w initially treated with Atripla eventually changed over to Triumeq which she is maintained perfect virological suppression.  He does  not have hepatitis B coinfection.    I was able to convince him to switch to Dovato today to get rid of the abacavir with its potential cardiovascular risk.    Past Medical History:  Diagnosis Date  . Acute renal insufficiency 02/10/2017  . Anal wart 08/07/2016  . Anemia   . Anxiety   . Contusion of bone 01/18/2020  . Deep venous thrombosis (HCC)   . Depression   . External hemorrhoid 08/07/2016  . Generalized anxiety disorder 08/10/2014  . HIV disease (HCC) 08/13/2017  . Internal hemorrhoid 08/07/2016  . Panic attack 08/10/2014  . PCP (pneumocystis jiroveci pneumonia) (HCC)   . Syphilis 09/20/2015    No past surgical history on file.  Family History  Problem Relation Age of Onset  . Kidney disease Maternal Grandmother   . Cancer Neg Hx   . Diabetes Neg Hx       Social History   Socioeconomic History  . Marital status: Single    Spouse name: Not on file  . Number of children: Not on file  . Years of education: Not on file  . Highest education level: Not on file  Occupational History  . Not on file  Tobacco Use  . Smoking status: Never Smoker  . Smokeless tobacco: Never Used  Vaping Use  . Vaping Use: Never used  Substance and Sexual Activity  . Alcohol use: Not Currently    Alcohol/week: 1.0 standard drink    Types: 1 Standard drinks or equivalent per week    Comment: wine  . Drug use: Not Currently    Types: Marijuana  . Sexual activity: Not Currently    Partners: Male    Comment: pt givne condoms  Other Topics Concern  . Not on file  Social History Narrative  . Not on file   Social Determinants of Health   Financial  Resource Strain: Not on file  Food Insecurity: Not on file  Transportation Needs: Not on file  Physical Activity: Not on file  Stress: Not on file  Social Connections: Not on file    Allergies  Allergen Reactions  . Zithromax [Azithromycin]     Developed a DVT and was told likely for this abx--this MAKES NO SENSE to me-- [name]     Current Outpatient Medications:  .  abacavir-dolutegravir-lamiVUDine (TRIUMEQ) 600-50-300 MG tablet, Take 1 tablet by mouth daily., Disp: 30 tablet, Rfl: 0 .  loratadine (CLARITIN) 10 MG tablet, Take 10 mg by mouth daily as needed for allergies., Disp: , Rfl:  .  sertraline (ZOLOFT) 25 MG tablet, Take 1 tablet (25 mg total) by mouth daily., Disp: 90 tablet, Rfl: 1         Review of Systems  Constitutional: Negative for activity change, appetite change, chills, diaphoresis, fatigue, fever and unexpected weight change.  HENT: Negative for congestion, rhinorrhea, sinus pressure, sneezing, sore throat and trouble swallowing.   Eyes: Negative for photophobia and visual disturbance.  Respiratory: Negative for cough, chest tightness, shortness of breath, wheezing and stridor.   Cardiovascular: Negative for chest pain, palpitations and leg swelling.  Gastrointestinal: Negative for abdominal  distention, abdominal pain, anal bleeding, blood in stool, constipation, diarrhea, nausea, rectal pain and vomiting.  Genitourinary: Negative for difficulty urinating, dysuria, flank pain and hematuria.  Musculoskeletal: Negative for arthralgias, gait problem, joint swelling and myalgias.  Skin: Negative for color change, pallor, rash and wound.  Neurological: Negative for dizziness, tremors, seizures, weakness and light-headedness.  Hematological: Negative for adenopathy. Does not bruise/bleed easily.  Psychiatric/Behavioral: Negative for behavioral problems, confusion, decreased concentration, dysphoric mood, sleep disturbance and suicidal ideas. The patient is not  nervous/anxious.        Objective:   Physical Exam Vitals and nursing note reviewed.  Constitutional:      General: He is not in acute distress.    Appearance: He is well-developed. He is not diaphoretic.  HENT:     Head: Normocephalic and atraumatic.     Mouth/Throat:     Pharynx: No oropharyngeal exudate.  Eyes:     General: No scleral icterus.    Conjunctiva/sclera: Conjunctivae normal.     Pupils: Pupils are equal, round, and reactive to light.  Neck:     Vascular: No JVD.  Cardiovascular:     Rate and Rhythm: Normal rate and regular rhythm.  Pulmonary:     Effort: Pulmonary effort is normal. No respiratory distress.     Breath sounds: No wheezing.  Abdominal:     General: There is no distension.  Musculoskeletal:        General: No tenderness or deformity.     Cervical back: Normal range of motion and neck supple.  Lymphadenopathy:     Cervical: No cervical adenopathy.  Skin:    General: Skin is warm and dry.     Coloration: Skin is not pale.     Findings: No erythema.  Neurological:     General: No focal deficit present.     Mental Status: He is alert and oriented to person, place, and time.     Motor: No abnormal muscle tone.     Coordination: Coordination normal.     Deep Tendon Reflexes: Reflexes are normal and symmetric.  Psychiatric:        Mood and Affect: Mood normal. Mood is not anxious or depressed.        Behavior: Behavior normal.        Thought Content: Thought content normal.        Judgment: Judgment normal.           Assessment & Plan:    #1 HIV disease with hx of AIDS: Switch to Dovato checking labs today we will check them again when I see him in 1 month's time.   2.  Syphilis titers are stable  3 Contusion : Following with Dr. Lajoyce Corners  4. HTN: a degree of white coat HTN also gained weight recently.  I have asked him to get an ambulatory blood pressure cuff that he can give [place] measurements from home.  He just come off work and was  fairly stressed out coming to clinic today.  5.  COVID prevention he needs a booster vaccine as he only had 2 vaccines he wants to do this through his work where they give him time off from work to get vaccinated.    I spent more than with the patient including greater than 50% of time in face to face counseling of the patient personally reviewing radiographs, along with pertinent laboratory microbiological data review of medical records and in coordination of his care.

## 2020-08-23 LAB — MICROALBUMIN / CREATININE URINE RATIO
Creatinine, Urine: 348 mg/dL — ABNORMAL HIGH (ref 20–320)
Microalb Creat Ratio: 3 mcg/mg creat (ref <30)
Microalb, Ur: 0.9 mg/dL

## 2020-08-23 LAB — T-HELPER CELL (CD4) - (RCID CLINIC ONLY)
CD4 % Helper T Cell: 29 % — ABNORMAL LOW (ref 33–65)
CD4 T Cell Abs: 502 /uL (ref 400–1790)

## 2020-08-24 LAB — CBC WITH DIFFERENTIAL/PLATELET
Absolute Monocytes: 320 cells/uL (ref 200–950)
Basophils Absolute: 51 cells/uL (ref 0–200)
Basophils Relative: 1.5 %
Eosinophils Absolute: 170 cells/uL (ref 15–500)
Eosinophils Relative: 5 %
HCT: 40.7 % (ref 38.5–50.0)
Hemoglobin: 14 g/dL (ref 13.2–17.1)
Lymphs Abs: 1710 cells/uL (ref 850–3900)
MCH: 30 pg (ref 27.0–33.0)
MCHC: 34.4 g/dL (ref 32.0–36.0)
MCV: 87.3 fL (ref 80.0–100.0)
MPV: 9.2 fL (ref 7.5–12.5)
Monocytes Relative: 9.4 %
Neutro Abs: 1149 cells/uL — ABNORMAL LOW (ref 1500–7800)
Neutrophils Relative %: 33.8 %
Platelets: 297 10*3/uL (ref 140–400)
RBC: 4.66 10*6/uL (ref 4.20–5.80)
RDW: 13 % (ref 11.0–15.0)
Total Lymphocyte: 50.3 %
WBC: 3.4 10*3/uL — ABNORMAL LOW (ref 3.8–10.8)

## 2020-08-24 LAB — HIV-1 RNA QUANT-NO REFLEX-BLD
HIV 1 RNA Quant: 21 Copies/mL — ABNORMAL HIGH
HIV-1 RNA Quant, Log: 1.32 Log cps/mL — ABNORMAL HIGH

## 2020-08-24 LAB — COMPLETE METABOLIC PANEL WITH GFR
AG Ratio: 1.7 (calc) (ref 1.0–2.5)
ALT: 16 U/L (ref 9–46)
AST: 20 U/L (ref 10–40)
Albumin: 4.7 g/dL (ref 3.6–5.1)
Alkaline phosphatase (APISO): 76 U/L (ref 36–130)
BUN: 9 mg/dL (ref 7–25)
CO2: 23 mmol/L (ref 20–32)
Calcium: 9.8 mg/dL (ref 8.6–10.3)
Chloride: 105 mmol/L (ref 98–110)
Creat: 1.16 mg/dL (ref 0.60–1.35)
GFR, Est African American: 91 mL/min/{1.73_m2} (ref >=60)
GFR, Est Non African American: 79 mL/min/{1.73_m2} (ref >=60)
Globulin: 2.7 g/dL (calc) (ref 1.9–3.7)
Glucose, Bld: 141 mg/dL — ABNORMAL HIGH (ref 65–99)
Potassium: 3.5 mmol/L (ref 3.5–5.3)
Sodium: 140 mmol/L (ref 135–146)
Total Bilirubin: 0.4 mg/dL (ref 0.2–1.2)
Total Protein: 7.4 g/dL (ref 6.1–8.1)

## 2020-08-24 LAB — HEPATITIS C ANTIBODY
Hepatitis C Ab: NONREACTIVE
SIGNAL TO CUT-OFF: 0.06 (ref <1.00)

## 2020-08-24 LAB — HEMOGLOBIN A1C
Hgb A1c MFr Bld: 5.6 % of total Hgb (ref <5.7)
Mean Plasma Glucose: 114 mg/dL
eAG (mmol/L): 6.3 mmol/L

## 2020-08-24 LAB — RPR: RPR Ser Ql: REACTIVE — AB

## 2020-08-24 LAB — FLUORESCENT TREPONEMAL AB(FTA)-IGG-BLD: Fluorescent Treponemal ABS: REACTIVE — AB

## 2020-08-24 LAB — RPR TITER: RPR Titer: 1:1 {titer} — ABNORMAL HIGH

## 2020-08-28 LAB — URINE CYTOLOGY ANCILLARY ONLY
Chlamydia: NEGATIVE
Comment: NEGATIVE
Comment: NORMAL
Neisseria Gonorrhea: NEGATIVE

## 2020-09-29 ENCOUNTER — Other Ambulatory Visit: Payer: Self-pay

## 2020-09-29 ENCOUNTER — Encounter: Payer: Self-pay | Admitting: Infectious Disease

## 2020-09-29 ENCOUNTER — Ambulatory Visit (INDEPENDENT_AMBULATORY_CARE_PROVIDER_SITE_OTHER): Payer: 59 | Admitting: Infectious Disease

## 2020-09-29 VITALS — BP 149/94 | HR 59 | Temp 98.0°F | Wt 194.0 lb

## 2020-09-29 DIAGNOSIS — B20 Human immunodeficiency virus [HIV] disease: Secondary | ICD-10-CM | POA: Diagnosis not present

## 2020-09-29 DIAGNOSIS — R635 Abnormal weight gain: Secondary | ICD-10-CM

## 2020-09-29 DIAGNOSIS — I10 Essential (primary) hypertension: Secondary | ICD-10-CM

## 2020-09-29 DIAGNOSIS — A539 Syphilis, unspecified: Secondary | ICD-10-CM | POA: Diagnosis not present

## 2020-09-29 MED ORDER — AMLODIPINE BESYLATE 5 MG PO TABS: 5.0000 mg | ORAL_TABLET | Freq: Every day | ORAL | 11 refills | Status: DC

## 2020-09-29 NOTE — Progress Notes (Signed)
Chief complaint: followup for HIV medications after switch to Dovato Subjective:    Patient ID: Lance Craig, male    DOB: 08/08/1980, 40 y.o.   MRN: [id number]  HPI  Lance Craig is a 40 y.o. male with history of HIV/AIDS w initially treated with Atripla eventually changed over to Triumeq which she is maintained perfect virological suppression.  He does  not have hepatitis B coinfection.    I was able to convince him to switch to Dovato she has been on for the last 3 weeks.  Also purchased a blood pressure cuff that he has been using around his wrist but it measures 20-30 points higher than our machine here does.  He still is hypertensive by our measurements and I would like to start him on a low-dose of amlodipine.  Is also curious about risk of monkey pox infection was counseled with regards to this.     Past Medical History:  Diagnosis Date   Acute renal insufficiency 02/10/2017   Anal wart 08/07/2016   Anemia    Anxiety    Contusion of bone 01/18/2020   Deep venous thrombosis (HCC)    Depression    External hemorrhoid 08/07/2016   Generalized anxiety disorder 08/10/2014   HIV disease (HCC) 08/13/2017   Hypertension 08/22/2020   Internal hemorrhoid 08/07/2016   Panic attack 08/10/2014   PCP (pneumocystis jiroveci pneumonia) (HCC)    Syphilis 09/20/2015    No past surgical history on file.  Family History  Problem Relation Age of Onset   Kidney disease Maternal Grandmother    Cancer Neg Hx    Diabetes Neg Hx       Social History   Socioeconomic History   Marital status: Single    Spouse name: Not on file   Number of children: Not on file   Years of education: Not on file   Highest education level: Not on file  Occupational History   Not on file  Tobacco Use   Smoking status: Never   Smokeless tobacco: Never  Vaping Use   Vaping Use: Never used  Substance and Sexual Activity   Alcohol use: Not Currently    Alcohol/week: 1.0 standard drink    Types: 1  Standard drinks or equivalent per week    Comment: wine   Drug use: Not Currently    Types: Marijuana   Sexual activity: Not Currently    Partners: Male    Comment: DECLINED CONDOMS  Other Topics Concern   Not on file  Social History Narrative   Not on file   Social Determinants of Health   Financial Resource Strain: Not on file  Food Insecurity: Not on file  Transportation Needs: Not on file  Physical Activity: Not on file  Stress: Not on file  Social Connections: Not on file    Allergies  Allergen Reactions   Zithromax [Azithromycin]     Developed a DVT and was told likely for this abx--this MAKES NO SENSE to me-- [name]     Current Outpatient Medications:    amLODipine (NORVASC) 5 MG tablet, Take 1 tablet (5 mg total) by mouth daily., Disp: 30 tablet, Rfl: 11   Dolutegravir-lamiVUDine (DOVATO) 50-300 MG TABS, Take 1 tablet by mouth daily., Disp: 30 tablet, Rfl: 11   loratadine (CLARITIN) 10 MG tablet, Take 10 mg by mouth daily as needed for allergies. (Patient not taking: Reported on 09/29/2020), Disp: , Rfl:    sertraline (ZOLOFT) 25 MG tablet, Take 1  tablet (25 mg total) by mouth daily. (Patient not taking: Reported on 09/29/2020), Disp: 90 tablet, Rfl: 1          Review of Systems  Constitutional:  Negative for activity change, appetite change, chills, diaphoresis, fatigue, fever and unexpected weight change.  HENT:  Negative for congestion, rhinorrhea, sinus pressure, sneezing, sore throat and trouble swallowing.   Eyes:  Negative for photophobia and visual disturbance.  Respiratory:  Negative for cough, chest tightness, shortness of breath, wheezing and stridor.   Cardiovascular:  Negative for chest pain, palpitations and leg swelling.  Gastrointestinal:  Negative for abdominal distention, abdominal pain, anal bleeding, blood in stool, constipation, diarrhea, nausea and vomiting.  Genitourinary:  Negative for difficulty urinating, dysuria, flank pain and  hematuria.  Musculoskeletal:  Negative for arthralgias, back pain, gait problem, joint swelling and myalgias.  Skin:  Negative for color change, pallor, rash and wound.  Neurological:  Negative for dizziness, tremors, weakness and light-headedness.  Hematological:  Negative for adenopathy. Does not bruise/bleed easily.  Psychiatric/Behavioral:  Negative for agitation, behavioral problems, confusion, decreased concentration, dysphoric mood and sleep disturbance.       Objective:   Physical Exam Constitutional:      Appearance: He is well-developed.  HENT:     Head: Normocephalic and atraumatic.  Eyes:     Extraocular Movements: Extraocular movements intact.     Conjunctiva/sclera: Conjunctivae normal.  Cardiovascular:     Rate and Rhythm: Normal rate and regular rhythm.  Pulmonary:     Effort: Pulmonary effort is normal. No respiratory distress.     Breath sounds: No wheezing.  Abdominal:     General: There is no distension.     Palpations: Abdomen is soft.  Musculoskeletal:        General: No tenderness. Normal range of motion.     Cervical back: Normal range of motion and neck supple.  Skin:    General: Skin is warm and dry.     Coloration: Skin is not pale.     Findings: No erythema or rash.  Neurological:     General: No focal deficit present.     Mental Status: He is alert and oriented to person, place, and time.  Psychiatric:        Mood and Affect: Mood normal.        Behavior: Behavior normal.        Thought Content: Thought content normal.        Judgment: Judgment normal.          Assessment & Plan:    #1 HIV disease with hx of AIDS: switched to Dovato and will check labs today  2.  Syphilis titers are stable  3 HTN: add amlodipine   5.  COVID prevention ensure he is up to date  6 STI concern and for Monkeypox.  Informed that currently we do not yet have plans in place for preventing monkeypox in our clinic but that there would be more news tod come  int he future likely   I spent more than 30 minutes with the patient including face to face counseling of the patient personally reviewing radiographs, along with pertinent laboratory microbiological, virological data review of medical records before and during the visit and in coordination of his care.

## 2020-10-02 LAB — T-HELPER CELLS (CD4) COUNT (NOT AT ARMC)
Absolute CD4: 635 cells/uL (ref 490–1740)
CD4 T Helper %: 27 % — ABNORMAL LOW (ref 30–61)
Total lymphocyte count: 2392 cells/uL (ref 850–3900)

## 2020-10-02 LAB — HIV-1 RNA QUANT-NO REFLEX-BLD
HIV 1 RNA Quant: NOT DETECTED Copies/mL
HIV-1 RNA Quant, Log: NOT DETECTED Log cps/mL

## 2020-11-28 ENCOUNTER — Telehealth: Payer: Self-pay | Admitting: Orthopedic Surgery

## 2020-11-28 NOTE — Telephone Encounter (Signed)
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 Pt states its just 1 page and wanted to know if he dropped it off could it have it filled out w/o going through [employer]?  [phone]

## 2020-11-28 NOTE — Telephone Encounter (Signed)
This pt has not been evaluated in the office since 12/2019. He was only to have continued restriction x 60 days per the last office visit note. Can you please make appt for eval and can discuss with provider at visit?

## 2020-11-29 NOTE — Telephone Encounter (Signed)
Made an appt on 12/01/20 @ 9:15 with Lance Craig; is that ok?

## 2020-11-29 NOTE — Telephone Encounter (Signed)
That's great thank you!

## 2020-12-01 ENCOUNTER — Encounter: Payer: Self-pay | Admitting: Family

## 2020-12-01 ENCOUNTER — Ambulatory Visit (INDEPENDENT_AMBULATORY_CARE_PROVIDER_SITE_OTHER): Payer: 59 | Admitting: Family

## 2020-12-01 ENCOUNTER — Other Ambulatory Visit: Payer: Self-pay

## 2020-12-01 DIAGNOSIS — M25562 Pain in left knee: Secondary | ICD-10-CM | POA: Diagnosis not present

## 2020-12-01 DIAGNOSIS — G8929 Other chronic pain: Secondary | ICD-10-CM | POA: Diagnosis not present

## 2020-12-01 NOTE — Progress Notes (Signed)
Office Visit Note   Patient: Lance Craig           Date of Birth: 1980/09/28           MRN: [id number] Visit Date: 12/01/2020              Requested by: [place], Betty G, MD [address] [place],  [place] [address] PCP: [place], Betty G, MD  Chief Complaint  Patient presents with   Left Knee - Follow-up      HPI: The patient is a 40 year old gentleman seen today for reevaluation of left knee.  Reevaluation of his work status.  He is currently been light duty at his job for about the last year.  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  Continues with some waxing and waning swelling of the left knee this swells worse with increased weightbearing use especially bending flexion stooping.  He has fullness and pain with flexion left every day  Assessment & Plan: Visit Diagnoses: No diagnosis found.  Plan: He will continue with his supportive knee brace.  Will remain light duty work note and paperwork filled out for his employer today.  He requested reevaluation in 3 months  Follow-Up Instructions: No follow-ups on file.   Left Knee Exam   Muscle Strength  The patient has normal left knee strength.  Tenderness  The patient is experiencing no tenderness.   Range of Motion  The patient has normal left knee ROM.  Tests  Varus: negative Valgus: negative  Other  Erythema: absent Swelling: mild Effusion: no effusion present     Patient is alert, oriented, no adenopathy, well-dressed, normal affect, normal respiratory effort.   Imaging: No results found. No images are attached to the encounter.  Labs: Lab Results  Component Value Date   HGBA1C 5.6 08/22/2020   REPTSTATUS 10/31/2019 FINAL 10/27/2019   GRAMSTAIN  10/27/2019    ABUNDANT WBC PRESENT,BOTH PMN AND MONONUCLEAR NO ORGANISMS SEEN Gram Stain Report Called to,Read Back By and Verified With: T.SMITH AT 1828  ON 10/27/19 BY N.THOMPSON Performed at [facility], [address] [place], [place] [address]    CULT  10/27/2019    NO GROWTH 3 DAYS Performed at [facility], [address], [place], [place] [address]      Lab Results  Component Value Date   ALBUMIN 4.3 07/24/2016   ALBUMIN 4.2 01/10/2016   ALBUMIN 4.0 09/06/2015    No results found for: MG No results found for: VD25OH  No results found for: PREALBUMIN CBC EXTENDED Latest Ref Rng & Units 08/22/2020 01/18/2020 11/02/2019  WBC 3.8 - 10.8 Thousand/uL 3.4(L) 4.4 6.9  RBC 4.20 - 5.80 Million/uL 4.66 4.07(L) 4.77  HGB 13.2 - 17.1 g/dL 76.7 12.5(L) 14.1  HCT 38.5 - 50.0 % 40.7 36.6(L) 41.0  PLT 140 - 400 Thousand/uL 297 241 436(H)  NEUTROABS 1,500 - 7,800 cells/uL 1,149(L) 1,320(L) -  LYMPHSABS 850 - 3,900 cells/uL 1,710 2,055 -     There is no height or weight on file to calculate BMI.  Orders:  No orders of the defined types were placed in this encounter.  No orders of the defined types were placed in this encounter.    Procedures: No procedures performed  Clinical Data: No additional findings.  ROS:  All other systems negative, except as noted in the HPI. Review of Systems  Constitutional:  Negative for chills and fever.  Cardiovascular:  Negative for leg swelling.  Musculoskeletal:  Positive for arthralgias, joint swelling and myalgias.   Objective: Vital Signs: There were no vitals taken for this visit.  Specialty Comments:  No specialty comments available.  PMFS History: Patient Active Problem List   Diagnosis Date Noted   HTN (hypertension) 08/22/2020   Contusion of bone 01/18/2020   Numbness and tingling sensation of skin 12/13/2019   Influenza due to influenza virus, type B 03/23/2018   HIV disease (HCC) 08/13/2017   Acute renal insufficiency 02/10/2017   Anal wart 08/07/2016   Internal hemorrhoid 08/07/2016   External hemorrhoid 08/07/2016   Syphilis 09/20/2015    Panic attack 08/10/2014   Generalized anxiety disorder 08/10/2014   PCP (pneumocystis jiroveci pneumonia) (HCC)    Lumbago 10/14/2012   Deep venous thrombosis (HCC)    Weight gain 07/10/2011   Seasonal allergies 06/28/2011   Joint pain 02/21/2011   Folic acid deficiency 07/31/2010   Long term current use of anticoagulant 04/07/2010   AIDS (HCC) 01/01/2010   PNEUMOCYSTIS PNEUMONIA 01/01/2010   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   DVT 12/22/2009   PE 12/16/2009   Past Medical History:  Diagnosis Date   Acute renal insufficiency 02/10/2017   Anal wart 08/07/2016   Anemia    Anxiety    Contusion of bone 01/18/2020   Deep venous thrombosis (HCC)    Depression    External hemorrhoid 08/07/2016   Generalized anxiety disorder 08/10/2014   HIV disease (HCC) 08/13/2017   Hypertension 08/22/2020   Internal hemorrhoid 08/07/2016   Panic attack 08/10/2014   PCP (pneumocystis jiroveci pneumonia) (HCC)    Syphilis 09/20/2015    Family History  Problem Relation Age of Onset   Kidney disease Maternal Grandmother    Cancer Neg Hx    Diabetes Neg Hx     History reviewed. No pertinent surgical history. Social History   Occupational History   Not on file  Tobacco Use   Smoking status: Never   Smokeless tobacco: Never  Vaping Use   Vaping Use: Never used  Substance and Sexual Activity   Alcohol use: Not Currently    Alcohol/week: 1.0 standard drink    Types: 1 Standard drinks or equivalent per week    Comment: wine   Drug use: Not Currently    Types: Marijuana   Sexual activity: Not Currently    Partners: Male    Comment: DECLINED CONDOMS

## 2021-01-02 ENCOUNTER — Encounter: Payer: Self-pay | Admitting: Infectious Disease

## 2021-01-02 ENCOUNTER — Ambulatory Visit (INDEPENDENT_AMBULATORY_CARE_PROVIDER_SITE_OTHER): Payer: 59 | Admitting: Infectious Disease

## 2021-01-02 ENCOUNTER — Other Ambulatory Visit: Payer: Self-pay

## 2021-01-02 VITALS — BP 148/90 | HR 66 | Temp 97.4°F | Ht 69.0 in | Wt 197.0 lb

## 2021-01-02 DIAGNOSIS — M170 Bilateral primary osteoarthritis of knee: Secondary | ICD-10-CM

## 2021-01-02 DIAGNOSIS — A539 Syphilis, unspecified: Secondary | ICD-10-CM | POA: Diagnosis not present

## 2021-01-02 DIAGNOSIS — I10 Essential (primary) hypertension: Secondary | ICD-10-CM

## 2021-01-02 DIAGNOSIS — Z23 Encounter for immunization: Secondary | ICD-10-CM | POA: Diagnosis not present

## 2021-01-02 DIAGNOSIS — M1990 Unspecified osteoarthritis, unspecified site: Secondary | ICD-10-CM

## 2021-01-02 DIAGNOSIS — B20 Human immunodeficiency virus [HIV] disease: Secondary | ICD-10-CM | POA: Diagnosis not present

## 2021-01-02 DIAGNOSIS — Z7185 Encounter for immunization safety counseling: Secondary | ICD-10-CM

## 2021-01-02 HISTORY — DX: Encounter for immunization safety counseling: Z71.85

## 2021-01-02 HISTORY — DX: Unspecified osteoarthritis, unspecified site: M19.90

## 2021-01-02 MED ORDER — AMLODIPINE BESYLATE 5 MG PO TABS: 5.0000 mg | ORAL_TABLET | Freq: Every day | ORAL | 11 refills | Status: DC

## 2021-01-02 MED ORDER — DOVATO 50-300 MG PO TABS: 1.0000 | ORAL_TABLET | Freq: Every day | ORAL | 11 refills | Status: DC

## 2021-01-02 NOTE — Progress Notes (Signed)
Chief complaint: followup for HIV on  medications and for HTNvato Subjective:    Patient ID: Lance Craig, male    DOB: [birth year], 40 y.o.   MRN: [id number]  HPI  Lance Craig is a 40 y.o. male with history of HIV/AIDS w initially treated with Atripla eventually changed over to Triumeq which she is maintained perfect virological suppression.  He does  not have hepatitis B coinfection.    I was able to convince him to switch to Dovato and he has maintained perfect suppression.  I added amlodipine 5mg  at last visit for BP control but he has failed to start this yet.    Past Medical History:  Diagnosis Date   Acute renal insufficiency 02/10/2017   Anal wart 08/07/2016   Anemia    Anxiety    Contusion of bone 01/18/2020   Deep venous thrombosis (HCC)    Depression    External hemorrhoid 08/07/2016   Generalized anxiety disorder 08/10/2014   HIV disease (HCC) 08/13/2017   Hypertension 08/22/2020   Internal hemorrhoid 08/07/2016   Panic attack 08/10/2014   PCP (pneumocystis jiroveci pneumonia) (HCC)    Syphilis 09/20/2015    No past surgical history on file.  Family History  Problem Relation Age of Onset   Kidney disease Maternal Grandmother    Cancer Neg Hx    Diabetes Neg Hx       Social History   Socioeconomic History   Marital status: Single    Spouse name: Not on file   Number of children: Not on file   Years of education: Not on file   Highest education level: Not on file  Occupational History   Not on file  Tobacco Use   Smoking status: Never   Smokeless tobacco: Never  Vaping Use   Vaping Use: Never used  Substance and Sexual Activity   Alcohol use: Not Currently    Alcohol/week: 1.0 standard drink    Types: 1 Standard drinks or equivalent per week    Comment: wine   Drug use: Not Currently    Types: Marijuana   Sexual activity: Not Currently    Partners: Male    Comment: accepted condoms  Other Topics Concern   Not on file  Social History Narrative    Not on file   Social Determinants of Health   Financial Resource Strain: Not on file  Food Insecurity: Not on file  Transportation Needs: Not on file  Physical Activity: Not on file  Stress: Not on file  Social Connections: Not on file    Allergies  Allergen Reactions   Zithromax [Azithromycin]     Developed a DVT and was told likely for this abx--this MAKES NO SENSE to me-- 11/21/2015 Dam     Current Outpatient Medications:    amLODipine (NORVASC) 5 MG tablet, Take 1 tablet (5 mg total) by mouth daily., Disp: 30 tablet, Rfl: 11   Dolutegravir-lamiVUDine (DOVATO) 50-300 MG TABS, Take 1 tablet by mouth daily., Disp: 30 tablet, Rfl: 11   loratadine (CLARITIN) 10 MG tablet, Take 10 mg by mouth daily as needed for allergies. (Patient not taking: Reported on 09/29/2020), Disp: , Rfl:    sertraline (ZOLOFT) 25 MG tablet, Take 1 tablet (25 mg total) by mouth daily. (Patient not taking: Reported on 09/29/2020), Disp: 90 tablet, Rfl: 1          Review of Systems  Constitutional:  Negative for activity change, appetite change, chills, diaphoresis, fatigue, fever and unexpected weight  change.  HENT:  Negative for congestion, rhinorrhea, sinus pressure, sneezing, sore throat and trouble swallowing.   Eyes:  Negative for photophobia and visual disturbance.  Respiratory:  Negative for cough, chest tightness, shortness of breath, wheezing and stridor.   Cardiovascular:  Negative for chest pain, palpitations and leg swelling.  Gastrointestinal:  Negative for abdominal distention, abdominal pain, anal bleeding, blood in stool, constipation, diarrhea, nausea and vomiting.  Genitourinary:  Negative for difficulty urinating, dysuria, flank pain and hematuria.  Musculoskeletal:  Negative for arthralgias, back pain, gait problem, joint swelling and myalgias.  Skin:  Negative for color change, pallor, rash and wound.  Neurological:  Negative for dizziness, tremors, weakness and light-headedness.   Hematological:  Negative for adenopathy. Does not bruise/bleed easily.  Psychiatric/Behavioral:  Negative for agitation, behavioral problems, confusion, decreased concentration, dysphoric mood and sleep disturbance.       Objective:   Physical Exam Constitutional:      Appearance: He is well-developed.  HENT:     Head: Normocephalic and atraumatic.  Eyes:     Conjunctiva/sclera: Conjunctivae normal.  Cardiovascular:     Rate and Rhythm: Normal rate and regular rhythm.  Pulmonary:     Effort: Pulmonary effort is normal. No respiratory distress.     Breath sounds: No wheezing.  Abdominal:     General: There is no distension.     Palpations: Abdomen is soft.  Musculoskeletal:        General: No tenderness. Normal range of motion.     Cervical back: Normal range of motion and neck supple.  Skin:    General: Skin is warm and dry.     Coloration: Skin is not pale.     Findings: No erythema or rash.  Neurological:     General: No focal deficit present.     Mental Status: He is alert and oriented to person, place, and time.  Psychiatric:        Mood and Affect: Mood normal.        Behavior: Behavior normal.        Thought Content: Thought content normal.        Judgment: Judgment normal.          Assessment & Plan:    HIV disease: reviewed his VL from 7/15 which was not detected   Lab Results  Component Value Date   HIV1RNAQUANT Not Detected 09/29/2020   Reviewed CD4 count which was 502 Lab Results  Component Value Date   CD4TABS 502 08/22/2020   CD4TABS 576 01/18/2020   CD4TABS 455 06/22/2019    I am continuing his Dovato  Osteoarthritis: he is wearing brace from orthopedics  Syphilis: serofast  Hypertension: would like him to start amlodipine and see PCP  Vaccine counselling: he needs bivalent booster but did not want to take today. He was agreeable to flu  shot

## 2021-02-22 ENCOUNTER — Encounter: Payer: Self-pay | Admitting: Family Medicine

## 2021-02-22 ENCOUNTER — Ambulatory Visit (INDEPENDENT_AMBULATORY_CARE_PROVIDER_SITE_OTHER): Payer: 59 | Admitting: Family Medicine

## 2021-02-22 VITALS — BP 142/90 | HR 80 | Temp 98.5°F | Wt 194.8 lb

## 2021-02-22 DIAGNOSIS — I10 Essential (primary) hypertension: Secondary | ICD-10-CM | POA: Diagnosis not present

## 2021-02-22 DIAGNOSIS — H66003 Acute suppurative otitis media without spontaneous rupture of ear drum, bilateral: Secondary | ICD-10-CM

## 2021-02-22 MED ORDER — AMOXICILLIN-POT CLAVULANATE 500-125 MG PO TABS: 1.0000 | ORAL_TABLET | Freq: Two times a day (BID) | ORAL | 0 refills | Status: AC

## 2021-02-22 NOTE — Progress Notes (Signed)
Subjective:    Patient ID: Lance Craig, male    DOB: 1980/04/18, 40 y.o.   MRN: [id number]  Chief Complaint  Patient presents with   Ear Drainage    Used sweet oil to help with pain, pain is gone but still feels full    HPI Patient was seen today for acute concern.  Patient endorses ear pain x2 days.Tried sweet oil in ear, but notes drainage.  Felt like might be getting sick late Sunday night as developed a mild sore throat.  Took tylenol.    Past Medical History:  Diagnosis Date   Acute renal insufficiency 02/10/2017   Anal wart 08/07/2016   Anemia    Anxiety    Contusion of bone 01/18/2020   Deep venous thrombosis (HCC)    Depression    External hemorrhoid 08/07/2016   Generalized anxiety disorder 08/10/2014   HIV disease (HCC) 08/13/2017   Hypertension 08/22/2020   Internal hemorrhoid 08/07/2016   Osteoarthritis 01/02/2021   Panic attack 08/10/2014   PCP (pneumocystis jiroveci pneumonia) (HCC)    Syphilis 09/20/2015   Vaccine counseling 01/02/2021    Allergies  Allergen Reactions   Zithromax [Azithromycin]     Developed a DVT and was told likely for this abx--this MAKES NO SENSE to me-- [name]    ROS General: Denies fever, chills, night sweats, changes in weight, changes in appetite HEENT: Denies headaches, ear pain, changes in vision, rhinorrhea, sore throat +ear pain/drainage CV: Denies CP, palpitations, SOB, orthopnea Pulm: Denies SOB, cough, wheezing GI: Denies abdominal pain, nausea, vomiting, diarrhea, constipation GU: Denies dysuria, hematuria, frequency Msk: Denies muscle cramps, joint pains Neuro: Denies weakness, numbness, tingling Skin: Denies rashes, bruising Psych: Denies depression, anxiety, hallucinations     Objective:    Blood pressure (!) 142/90, pulse 80, temperature 98.5 F (36.9 C), temperature source Oral, weight 194 lb 12.8 oz (88.4 kg), SpO2 98 %.  Gen. Pleasant, well-nourished, in no distress, normal affect   HEENT: [place]/AT, face  symmetric, conjunctiva clear, no scleral icterus, PERRLA, EOMI, nares patent without drainage, pharynx without erythema or exudate.  Open blister at opening of canal in left ear.  Whitish appearing loose wax in canal partially occluding TM.  TM full with suppurative fluid and erythema.  Pustule at preauricular area of right ear.  Right TM full with mild erythema and speckled with white purulent fluid. Lungs: no accessory muscle use Cardiovascular: RRR, no peripheral edema Musculoskeletal: No deformities, no cyanosis or clubbing, normal tone Neuro:  A&Ox3, CN II-XII intact, normal gait Skin:  Warm, no lesions/ rash   Wt Readings from Last 3 Encounters:  01/02/21 197 lb (89.4 kg)  09/29/20 194 lb (88 kg)  08/22/20 192 lb (87.1 kg)    Lab Results  Component Value Date   WBC 3.4 (L) 08/22/2020   HGB 14.0 08/22/2020   HCT 40.7 08/22/2020   PLT 297 08/22/2020   GLUCOSE 141 (H) 08/22/2020   CHOL 175 01/18/2020   TRIG 151 (H) 01/18/2020   HDL 63 01/18/2020   LDLCALC 87 01/18/2020   ALT 16 08/22/2020   AST 20 08/22/2020   NA 140 08/22/2020   K 3.5 08/22/2020   CL 105 08/22/2020   CREATININE 1.16 08/22/2020   BUN 9 08/22/2020   CO2 23 08/22/2020   TSH 0.80 11/02/2019   INR 2.6 07/16/2010   HGBA1C 5.6 08/22/2020   MICROALBUR 0.9 08/22/2020    Assessment/Plan:  Acute suppurative otitis media of both ears without spontaneous rupture  of tympanic membranes, recurrence not specified -Continue supportive care -We will start Augmentin -Given precautions  - Plan: amoxicillin-clavulanate (AUGMENTIN) 500-125 MG tablet  Essential hypertension -Elevated -Continue Norvasc 5 mg daily -Lifestyle modification strongly encouraged  F/u as needed with PCP  Grier Mitts, MD

## 2021-03-02 ENCOUNTER — Ambulatory Visit (INDEPENDENT_AMBULATORY_CARE_PROVIDER_SITE_OTHER): Payer: 59 | Admitting: Family Medicine

## 2021-03-02 ENCOUNTER — Telehealth: Payer: Self-pay

## 2021-03-02 ENCOUNTER — Encounter: Payer: Self-pay | Admitting: Family Medicine

## 2021-03-02 VITALS — BP 128/82 | HR 65 | Temp 98.1°F | Resp 16 | Ht 69.0 in | Wt 193.4 lb

## 2021-03-02 DIAGNOSIS — H9191 Unspecified hearing loss, right ear: Secondary | ICD-10-CM

## 2021-03-02 DIAGNOSIS — H66003 Acute suppurative otitis media without spontaneous rupture of ear drum, bilateral: Secondary | ICD-10-CM

## 2021-03-02 NOTE — Progress Notes (Signed)
Chief Complaint  Patient presents with   Ear Fullness    Completed antibiotic yesterday. Started in the left ear, has moved over to the right ear. Feels like he can't hear out of it.    HPI: Mr.Vaun MOUHAMAD TEED is a 40 y.o. male, who is here today complaining of right ear "clogged" sensation for about a week as described above. Ear problems during childhood.  Ear Fullness  There is pain in the right ear. The current episode started in the past 7 days. The problem has been unchanged. There has been no fever. The patient is experiencing no pain. Associated symptoms include hearing loss and rhinorrhea. Pertinent negatives include no abdominal pain, coughing, diarrhea, ear discharge, headaches, neck pain, rash, sore throat or vomiting. He has tried antibiotics for the symptoms. There is no history of a chronic ear infection.  Problem is constant. He was recently treated for acute suppurative OM, bilateral.He did not have hearing problem before recent OM. He is not longer having earache or ear drainage. No hx of trauma or recent trauma.  Review of Systems  Constitutional:  Negative for appetite change, chills and fever.  HENT:  Positive for hearing loss and rhinorrhea. Negative for ear discharge and sore throat.   Respiratory:  Negative for cough and wheezing.   Gastrointestinal:  Negative for abdominal pain, diarrhea and vomiting.  Musculoskeletal:  Negative for neck pain.  Skin:  Negative for rash.  Neurological:  Negative for dizziness, syncope and headaches.  Psychiatric/Behavioral:  Negative for confusion. The patient is nervous/anxious.   Rest see pertinent positives and negatives per HPI.  Current Outpatient Medications on File Prior to Visit  Medication Sig Dispense Refill   amLODipine (NORVASC) 5 MG tablet Take 1 tablet (5 mg total) by mouth daily. 30 tablet 11   dolutegravir-lamiVUDine (DOVATO) 50-300 MG tablet Take 1 tablet by mouth daily. 30 tablet 11   loratadine (CLARITIN) 10  MG tablet Take 10 mg by mouth daily as needed for allergies.     sertraline (ZOLOFT) 25 MG tablet Take 1 tablet (25 mg total) by mouth daily. 90 tablet 1   No current facility-administered medications on file prior to visit.   Past Medical History:  Diagnosis Date   Acute renal insufficiency 02/10/2017   Anal wart 08/07/2016   Anemia    Anxiety    Contusion of bone 01/18/2020   Deep venous thrombosis (HCC)    Depression    External hemorrhoid 08/07/2016   Generalized anxiety disorder 08/10/2014   HIV disease (HCC) 08/13/2017   Hypertension 08/22/2020   Internal hemorrhoid 08/07/2016   Osteoarthritis 01/02/2021   Panic attack 08/10/2014   PCP (pneumocystis jiroveci pneumonia) (HCC)    Syphilis 09/20/2015   Vaccine counseling 01/02/2021   Allergies  Allergen Reactions   Zithromax [Azithromycin]     Developed a DVT and was told likely for this abx--this MAKES NO SENSE to me-- [name]   Social History   Socioeconomic History   Marital status: Single    Spouse name: Not on file   Number of children: Not on file   Years of education: Not on file   Highest education level: Not on file  Occupational History   Not on file  Tobacco Use   Smoking status: Never   Smokeless tobacco: Never  Vaping Use   Vaping Use: Never used  Substance and Sexual Activity   Alcohol use: Not Currently    Alcohol/week: 1.0 standard drink    Types: 1  Standard drinks or equivalent per week    Comment: wine   Drug use: Not Currently    Types: Marijuana   Sexual activity: Not Currently    Partners: Male    Comment: accepted condoms  Other Topics Concern   Not on file  Social History Narrative   Not on file   Social Determinants of Health   Financial Resource Strain: Not on file  Food Insecurity: Not on file  Transportation Needs: Not on file  Physical Activity: Not on file  Stress: Not on file  Social Connections: Not on file   Vitals:   03/02/21 1555  BP: 128/82  Pulse: 65  Resp: 16   Temp: 98.1 F (36.7 C)  SpO2: 98%   Body mass index is 28.56 kg/m.  Physical Exam Vitals and nursing note reviewed.  Constitutional:      General: He is not in acute distress.    Appearance: He is well-developed. He is not ill-appearing.  HENT:     Head: Normocephalic and atraumatic.     Right Ear: Ear canal and external ear normal. Tympanic membrane is bulging.     Left Ear: Tympanic membrane, ear canal and external ear normal.     Nose: Rhinorrhea present.     Right Sinus: No maxillary sinus tenderness or frontal sinus tenderness.     Left Sinus: No maxillary sinus tenderness or frontal sinus tenderness.     Mouth/Throat:     Mouth: Mucous membranes are moist.     Pharynx: Oropharynx is clear.  Eyes:     Conjunctiva/sclera: Conjunctivae normal.  Cardiovascular:     Rate and Rhythm: Normal rate and regular rhythm.     Heart sounds: No murmur heard. Pulmonary:     Effort: Pulmonary effort is normal. No respiratory distress.     Breath sounds: Normal breath sounds. No stridor.  Lymphadenopathy:     Head:     Right side of head: No submandibular adenopathy.     Left side of head: No submandibular adenopathy.     Cervical: No cervical adenopathy.  Skin:    General: Skin is warm.     Findings: No erythema or rash.  Neurological:     General: No focal deficit present.     Mental Status: He is alert and oriented to person, place, and time.     Cranial Nerves: No cranial nerve deficit.     Gait: Gait normal.  Psychiatric:        Mood and Affect: Mood is anxious.     Comments: Well groomed, good eye contact.   ASSESSMENT AND PLAN:  Mr. Alvah was seen today for ear fullness.  Diagnoses and all orders for this visit:  Hearing loss of right ear, unspecified hearing loss type We discussed possible etiologies. Hx and examination are more suggestive of conductive hearing loss. Recommend OTC Sudafed 12 hours once daily for 10-14 days, side effects  discussed. Autoinflation maneuvers a few times during the day may also help. ENT referral placed. Clearly instructed about warning signs.  Hearing Screening   500Hz  1000Hz  2000Hz  4000Hz   Right ear Fail Fail Pass Fail  Left ear Pass Pass Pass Pass    Acute suppurative otitis media of both ears without spontaneous rupture of tympanic membranes, recurrence not specified Symptoms have resolved. Completed abx treatment. Problem has resolved.  Return if symptoms worsen or fail to improve.  Khai Arrona G. , MD  [facility]. [place] office.

## 2021-03-02 NOTE — Patient Instructions (Addendum)
A few things to remember from today's visit:   Hearing loss of right ear, unspecified hearing loss type - Plan: Ambulatory referral to ENT  If you need refills please call your pharmacy. Do not use My Chart to request refills or for acute issues that need immediate attention.   I think hearing loss is because fluid behind eardrum. Try to pop your ears a few times throughout the day, gently. Over the counter sudafed 12 hours once daily in the morning for 10-14 days may also help.  Please be sure medication list is accurate. If a new problem present, please set up appointment sooner than planned today.   Hearing Loss Hearing loss is a partial or total loss of the ability to hear. This can be temporary or permanent, and it can happen in one or both ears. Medical care is necessary to treat hearing loss properly and to prevent the condition from getting worse. Your hearing may partially or completely come back, depending on what caused your hearing loss and how severe it is. In some cases, hearing loss is permanent. What are the causes? Common causes of hearing loss include: Too much wax in the ear canal. Infection of the ear canal or middle ear. Fluid in the middle ear. Injury to the ear or surrounding area. An object stuck in the ear. A history of prolonged exposure to loud sounds, such as music. Less common causes of hearing loss include: Tumors in the ear. Viral or bacterial infections, such as meningitis. A hole in the eardrum (perforated eardrum). Problems with the hearing nerve that sends signals between the brain and the ear. Certain medicines. What are the signs or symptoms? Symptoms of this condition may include: Difficulty telling the difference between sounds. Difficulty following a conversation when there is background noise. Lack of response to sounds in your environment. This may be most noticeable when you do not respond to startling sounds. Needing to turn up the  volume on the television, radio, or other devices. Ringing in the ears. Dizziness. How is this diagnosed? This condition is diagnosed based on: A physical exam. A hearing test (audiometry). The audiometry test will be performed by a hearing specialist (audiologist). You may also be referred to an ear, nose, and throat (ENT) specialist (otolaryngologist). How is this treated? Treatment for hearing loss may include: Ear wax removal. Medicines to treat or prevent infection (antibiotics). Medicines to reduce inflammation (corticosteroids). Hearing aids for hearing loss related to nerve damage. Follow these instructions at home: If you were prescribed an antibiotic medicine, take it as told by your health care provider. Do not stop taking the antibiotic even if you start to feel better. Take over-the-counter and prescription medicines only as told by your health care provider. Avoid loud noises. Return to your normal activities as told by your health care provider. Ask your health care provider what activities are safe for you. Keep all follow-up visits as told by your health care provider. This is important. Contact a health care provider if: You feel dizzy. You develop new symptoms. You vomit or feel nauseous. You have a fever. Get help right away if: You develop sudden changes in your vision. You have severe ear pain. You have new or increased weakness. You have a severe headache. Summary Hearing loss is a decreased ability to hear sounds around you. It can be temporary or permanent. Treatment will depend on the cause of your hearing loss. It may include ear wax removal, medicines, or a hearing  aid. Your hearing may partially or completely come back, depending on what caused your hearing loss and how severe it is. Keep all follow-up visits as told by your health care provider. This is important. This information is not intended to replace advice given to you by your health care  provider. Make sure you discuss any questions you have with your health care provider. Document Revised: 12/02/2017 Document Reviewed: 12/02/2017 Elsevier Patient Education  2022 [employer].

## 2021-03-02 NOTE — Telephone Encounter (Signed)
Patient called stating he is having an earache and doesn't know what he should do and would like a call back I informed patient he should schedule.   A call was made to patient to schedule appt 12/16

## 2021-03-05 ENCOUNTER — Ambulatory Visit: Payer: 59 | Admitting: Family Medicine

## 2021-04-17 NOTE — Progress Notes (Signed)
Chief complaint: follow-up for HIV disease on medications namely Dovato Subjective:    Patient ID: Lance Craig, male    DOB: 1980-08-22, 41 y.o.   MRN: [id number]  HPI  Lance Craig is a 41 y.o. male with history of HIV/AIDS w initially treated with Atripla eventually changed over to Triumeq which she is maintained perfect virological suppression.  He does  not have hepatitis B coinfection.    I was able to convince him to switch to Dovato and he has maintained perfect suppression.  I added amlodipine 5mg  at last visit for BP control.      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  Family History  Problem Relation Age of Onset   Kidney disease Maternal Grandmother    Cancer Neg Hx    Diabetes Neg Hx       Social History   Socioeconomic History   Marital status: Single    Spouse name: Not on file   Number of children: Not on file   Years of education: Not on file   Highest education level: Not on file  Occupational History   Not on file  Tobacco Use   Smoking status: Never   Smokeless tobacco: Never  Vaping Use   Vaping Use: Never used  Substance and Sexual Activity   Alcohol use: Not Currently    Alcohol/week: 1.0 standard drink    Types: 1 Standard drinks or equivalent per week    Comment: wine   Drug use: Not Currently    Types: Marijuana   Sexual activity: Not Currently    Partners: Male    Comment: accepted condoms  Other Topics Concern    Not on file  Social History Narrative   Not on file   Social Determinants of Health   Financial Resource Strain: Not on file  Food Insecurity: Not on file  Transportation Needs: Not on file  Physical Activity: Not on file  Stress: Not on file  Social Connections: Not on file    Allergies  Allergen Reactions   Zithromax [Azithromycin]     Developed a DVT and was told likely for this abx--this MAKES NO SENSE to me-- Lance Craig     Current Outpatient Medications:    amLODipine (NORVASC) 5 MG tablet, Take 1 tablet (5 mg total) by mouth daily., Disp: 30 tablet, Rfl: 11   dolutegravir-lamiVUDine (DOVATO) 50-300 MG tablet, Take 1 tablet by mouth daily., Disp: 30 tablet, Rfl: 11   loratadine (CLARITIN) 10 MG tablet, Take 10 mg by mouth daily as needed for allergies., Disp: , Rfl:    sertraline (ZOLOFT) 25 MG tablet, Take 1 tablet (25 mg total) by mouth daily., Disp: 90 tablet, Rfl: 1          Review of Systems  Constitutional:  Negative for activity change, appetite change, chills, diaphoresis, fatigue, fever and unexpected weight change.  HENT:  Negative for congestion, rhinorrhea, sinus pressure,  sneezing, sore throat and trouble swallowing.   Eyes:  Negative for photophobia and visual disturbance.  Respiratory:  Negative for cough, chest tightness, shortness of breath, wheezing and stridor.   Cardiovascular:  Negative for chest pain, palpitations and leg swelling.  Gastrointestinal:  Negative for abdominal distention, abdominal pain, anal bleeding, blood in stool, constipation, diarrhea, nausea and vomiting.  Genitourinary:  Negative for difficulty urinating, dysuria, flank pain and hematuria.  Musculoskeletal:  Negative for arthralgias, back pain, gait problem, joint swelling and myalgias.  Skin:  Negative for color change, pallor, rash and wound.  Neurological:  Negative for dizziness, tremors, weakness and light-headedness.  Hematological:  Negative for adenopathy. Does  not bruise/bleed easily.  Psychiatric/Behavioral:  Negative for agitation, behavioral problems, confusion, decreased concentration, dysphoric mood and sleep disturbance.       Objective:   Physical Exam Constitutional:      Appearance: He is well-developed.  HENT:     Head: Normocephalic and atraumatic.  Eyes:     General:        Right eye: No discharge.        Left eye: No discharge.     Conjunctiva/sclera: Conjunctivae normal.  Cardiovascular:     Rate and Rhythm: Normal rate and regular rhythm.  Pulmonary:     Effort: Pulmonary effort is normal. No respiratory distress.     Breath sounds: No wheezing.  Abdominal:     General: There is no distension.     Palpations: Abdomen is soft.  Musculoskeletal:        General: No tenderness. Normal range of motion.     Cervical back: Normal range of motion and neck supple.  Skin:    General: Skin is warm and dry.     Coloration: Skin is not pale.     Findings: No erythema or rash.  Neurological:     General: No focal deficit present.     Mental Status: He is alert and oriented to person, place, and time.  Psychiatric:        Mood and Affect: Mood normal.        Behavior: Behavior normal.        Thought Content: Thought content normal.        Judgment: Judgment normal.          Assessment & Plan:   HIV disease:  I we will recheck HIV viral load CD4 CBC CMP RPR gonorrhea chlamydia  I am continue his Dovato prescription  Lab Results  Component Value Date   HIV1RNAQUANT Not Detected 09/29/2020   Reviewed CD4 count which was 502 Lab Results  Component Value Date   CD4TABS 502 08/22/2020   CD4TABS 576 01/18/2020   CD4TABS 455 06/22/2019    Hypertension:  Continue amlodipine and establish primary care which she has done  Syphilis check RPR Hypertension  There were no vitals filed for this visit. There is no height or weight on file to calculate BMI.  Anal wart HPV positivity needs annual Paps and high  resolution anoscopy referred him to [facility] anchor study to see if he is a candidate if he is not I will refer him locally to [facility] and [facility] Ruff  Vaccine counselling: I have recommended Prevnar 20

## 2021-04-18 ENCOUNTER — Ambulatory Visit (INDEPENDENT_AMBULATORY_CARE_PROVIDER_SITE_OTHER): Payer: 59 | Admitting: Infectious Disease

## 2021-04-18 ENCOUNTER — Encounter: Payer: Self-pay | Admitting: Infectious Disease

## 2021-04-18 ENCOUNTER — Other Ambulatory Visit: Payer: Self-pay

## 2021-04-18 VITALS — BP 136/89 | HR 79 | Resp 16 | Ht 69.0 in | Wt 198.0 lb

## 2021-04-18 DIAGNOSIS — A630 Anogenital (venereal) warts: Secondary | ICD-10-CM | POA: Diagnosis not present

## 2021-04-18 DIAGNOSIS — I10 Essential (primary) hypertension: Secondary | ICD-10-CM

## 2021-04-18 DIAGNOSIS — B20 Human immunodeficiency virus [HIV] disease: Secondary | ICD-10-CM

## 2021-04-18 DIAGNOSIS — A539 Syphilis, unspecified: Secondary | ICD-10-CM

## 2021-04-18 DIAGNOSIS — Z23 Encounter for immunization: Secondary | ICD-10-CM

## 2021-04-18 DIAGNOSIS — Z7185 Encounter for immunization safety counseling: Secondary | ICD-10-CM

## 2021-04-18 DIAGNOSIS — R635 Abnormal weight gain: Secondary | ICD-10-CM

## 2021-04-18 DIAGNOSIS — Z113 Encounter for screening for infections with a predominantly sexual mode of transmission: Secondary | ICD-10-CM

## 2021-04-18 MED ORDER — AMLODIPINE BESYLATE 5 MG PO TABS: 5.0000 mg | ORAL_TABLET | Freq: Every day | ORAL | 11 refills | Status: DC

## 2021-04-18 MED ORDER — DOVATO 50-300 MG PO TABS: 1.0000 | ORAL_TABLET | Freq: Every day | ORAL | 11 refills | Status: DC

## 2021-04-18 NOTE — Addendum Note (Signed)
Addended by: Clayborne Artist A on: 04/18/2021 02:30 PM   Modules accepted: Orders

## 2021-04-21 LAB — C. TRACHOMATIS/N. GONORRHOEAE RNA
C. trachomatis RNA, TMA: NOT DETECTED
N. gonorrhoeae RNA, TMA: NOT DETECTED

## 2021-04-21 LAB — COMPLETE METABOLIC PANEL WITH GFR
AG Ratio: 1.4 (calc) (ref 1.0–2.5)
ALT: 16 U/L (ref 9–46)
AST: 22 U/L (ref 10–40)
Albumin: 4.5 g/dL (ref 3.6–5.1)
Alkaline phosphatase (APISO): 67 U/L (ref 36–130)
BUN: 15 mg/dL (ref 7–25)
CO2: 29 mmol/L (ref 20–32)
Calcium: 9.1 mg/dL (ref 8.6–10.3)
Chloride: 102 mmol/L (ref 98–110)
Creat: 1.27 mg/dL (ref 0.60–1.29)
Globulin: 3.2 g/dL (calc) (ref 1.9–3.7)
Glucose, Bld: 73 mg/dL (ref 65–99)
Potassium: 3.7 mmol/L (ref 3.5–5.3)
Sodium: 139 mmol/L (ref 135–146)
Total Bilirubin: 0.4 mg/dL (ref 0.2–1.2)
Total Protein: 7.7 g/dL (ref 6.1–8.1)
eGFR: 73 mL/min/{1.73_m2} (ref >=60)

## 2021-04-21 LAB — RPR TITER: RPR Titer: 1:8 {titer} — ABNORMAL HIGH

## 2021-04-21 LAB — LIPID PANEL
Cholesterol: 183 mg/dL (ref <200)
HDL: 51 mg/dL (ref >=40)
LDL Cholesterol (Calc): 111 mg/dL (calc) — ABNORMAL HIGH
Non-HDL Cholesterol (Calc): 132 mg/dL (calc) — ABNORMAL HIGH (ref <130)
Total CHOL/HDL Ratio: 3.6 (calc) (ref <5.0)
Triglycerides: 103 mg/dL (ref <150)

## 2021-04-21 LAB — CBC WITH DIFFERENTIAL/PLATELET
Absolute Monocytes: 549 cells/uL (ref 200–950)
Basophils Absolute: 40 cells/uL (ref 0–200)
Basophils Relative: 1.3 %
Eosinophils Absolute: 121 cells/uL (ref 15–500)
Eosinophils Relative: 3.9 %
HCT: 42.5 % (ref 38.5–50.0)
Hemoglobin: 14.5 g/dL (ref 13.2–17.1)
Lymphs Abs: 1327 cells/uL (ref 850–3900)
MCH: 29.8 pg (ref 27.0–33.0)
MCHC: 34.1 g/dL (ref 32.0–36.0)
MCV: 87.4 fL (ref 80.0–100.0)
MPV: 9.7 fL (ref 7.5–12.5)
Monocytes Relative: 17.7 %
Neutro Abs: 1063 cells/uL — ABNORMAL LOW (ref 1500–7800)
Neutrophils Relative %: 34.3 %
Platelets: 248 10*3/uL (ref 140–400)
RBC: 4.86 10*6/uL (ref 4.20–5.80)
RDW: 13.4 % (ref 11.0–15.0)
Total Lymphocyte: 42.8 %
WBC: 3.1 10*3/uL — ABNORMAL LOW (ref 3.8–10.8)

## 2021-04-21 LAB — GC/CHLAMYDIA PROBE, AMP (THROAT)
Chlamydia trachomatis RNA: NOT DETECTED
Neisseria gonorrhoeae RNA: NOT DETECTED

## 2021-04-21 LAB — FLUORESCENT TREPONEMAL AB(FTA)-IGG-BLD: Fluorescent Treponemal ABS: REACTIVE — AB

## 2021-04-21 LAB — T-HELPER CELLS (CD4) COUNT (NOT AT ARMC)
Absolute CD4: 351 cells/uL — ABNORMAL LOW (ref 490–1740)
CD4 T Helper %: 25 % — ABNORMAL LOW (ref 30–61)
Total lymphocyte count: 1385 cells/uL (ref 850–3900)

## 2021-04-21 LAB — CT/NG RNA, TMA RECTAL
Chlamydia Trachomatis RNA: NOT DETECTED
Neisseria Gonorrhoeae RNA: NOT DETECTED

## 2021-04-21 LAB — RPR: RPR Ser Ql: REACTIVE — AB

## 2021-04-21 LAB — HIV-1 RNA QUANT-NO REFLEX-BLD
HIV 1 RNA Quant: NOT DETECTED Copies/mL
HIV-1 RNA Quant, Log: NOT DETECTED Log cps/mL

## 2021-04-23 ENCOUNTER — Telehealth: Payer: Self-pay

## 2021-04-23 NOTE — Telephone Encounter (Signed)
-----   Message from Randall Hiss, MD sent at 04/22/2021  8:44 PM EST ----- Misha's RPR titer back up more than 4 fold. He will need 2.4 MU weekly x 3 weeks ----- Message ----- From: Interface, [facility] Lab Results In Sent: 04/18/2021  11:04 PM EST To: Randall Hiss, MD

## 2021-04-23 NOTE — Telephone Encounter (Signed)
Attempted to call patient regarding lab results and to schedule nurse visit for bicillin inj x3. Left voicemail requesting patient call office back to go over results. Leatrice Jewels, RMA

## 2021-04-24 NOTE — Telephone Encounter (Signed)
Left voicemail asking patient to return my call.   Shebra Muldrow P Gari Trovato, CMA  

## 2021-04-25 ENCOUNTER — Ambulatory Visit (INDEPENDENT_AMBULATORY_CARE_PROVIDER_SITE_OTHER): Payer: 59

## 2021-04-25 ENCOUNTER — Other Ambulatory Visit: Payer: Self-pay

## 2021-04-25 DIAGNOSIS — A539 Syphilis, unspecified: Secondary | ICD-10-CM | POA: Diagnosis not present

## 2021-04-25 MED ORDER — PENICILLIN G BENZATHINE 1200000 UNIT/2ML IM SUSY
1.2000 10*6.[IU] | PREFILLED_SYRINGE | Freq: Once | INTRAMUSCULAR | Status: AC
  Administered 2021-04-25: 1.2 10*6.[IU] via INTRAMUSCULAR

## 2021-04-25 NOTE — Telephone Encounter (Signed)
Left voicemail asking patient to return my call. Also sent patient a my chart message.   Eldrige Pitkin Lesli Albee, CMA

## 2021-05-02 ENCOUNTER — Other Ambulatory Visit: Payer: Self-pay

## 2021-05-02 ENCOUNTER — Ambulatory Visit (INDEPENDENT_AMBULATORY_CARE_PROVIDER_SITE_OTHER): Payer: 59

## 2021-05-02 DIAGNOSIS — A539 Syphilis, unspecified: Secondary | ICD-10-CM | POA: Diagnosis not present

## 2021-05-02 MED ORDER — PENICILLIN G BENZATHINE 1200000 UNIT/2ML IM SUSY
1.2000 10*6.[IU] | PREFILLED_SYRINGE | Freq: Once | INTRAMUSCULAR | Status: AC
  Administered 2021-05-02: 1.2 10*6.[IU] via INTRAMUSCULAR

## 2021-05-09 ENCOUNTER — Other Ambulatory Visit: Payer: Self-pay

## 2021-05-09 ENCOUNTER — Ambulatory Visit (INDEPENDENT_AMBULATORY_CARE_PROVIDER_SITE_OTHER): Payer: 59

## 2021-05-09 DIAGNOSIS — A539 Syphilis, unspecified: Secondary | ICD-10-CM

## 2021-05-09 MED ORDER — PENICILLIN G BENZATHINE 1200000 UNIT/2ML IM SUSY
1.2000 10*6.[IU] | PREFILLED_SYRINGE | Freq: Once | INTRAMUSCULAR | Status: AC
  Administered 2021-05-09: 1.2 10*6.[IU] via INTRAMUSCULAR

## 2021-09-10 ENCOUNTER — Telehealth: Payer: Self-pay

## 2021-09-11 ENCOUNTER — Encounter: Payer: Self-pay | Admitting: Infectious Diseases

## 2021-09-11 ENCOUNTER — Other Ambulatory Visit: Payer: Self-pay

## 2021-09-11 ENCOUNTER — Ambulatory Visit (INDEPENDENT_AMBULATORY_CARE_PROVIDER_SITE_OTHER): Payer: 59 | Admitting: Infectious Diseases

## 2021-09-11 DIAGNOSIS — K649 Unspecified hemorrhoids: Secondary | ICD-10-CM

## 2021-09-11 MED ORDER — HYDROCORTISONE ACETATE 25 MG RE SUPP: 25.0000 mg | Freq: Two times a day (BID) | RECTAL | 0 refills | Status: DC

## 2021-09-11 NOTE — Assessment & Plan Note (Addendum)
Exam consistent with acute external hemorrhoid. Does not appear to be thrombosed at present but quite tender and swollen.   Recommendations:  Start anusol supp BID (medical lubricant provided) Topical hydrocortisone 2.5% cream BID  Miralax for BM 1-2 doses daily titrated for effect  Avoid prolonged sitting  Note written for work to remain out until June 29th.  Sitz baths  I did caution him that this is large enough that it may require drainage. If pain / size does not improve recommend he reach out to his PCP to see if they can arrange drainage in the office.

## 2021-09-11 NOTE — Progress Notes (Addendum)
      Subjective:   Chief Complaint  Patient presents with   Follow-up    New hemorrhoid - noticed it Friday and then Saturday had trouble passing BMs.       JERAMI TAMMEN is a 41 y.o. male patient here for unscheduled visit for care related to hemorrhoid.   First noticed some itching on Friday.  Started using Preparation H to try to help the pain. Saturday/Sunday it progressed to more pain.  Did have a sexual encounter recently where he was receptive partner and insufficient lubricant.  Usually uses the bathroom to pass BM at least once a day but Sunday had trouble passing one most of the day due to discomfort. Last BM was Monday that was pretty normal but painful. No blood with wiping.    Review of Systems: Review of Systems  Genitourinary:  Negative for difficulty urinating and genital sores.       +hemorrhoid and pain with defecation      Sexual History:  Prefers male partners, oropharyngeal, rectal and penile sites exposed Condom use - inconsistent     Past Medical History:  Diagnosis Date   Acute renal insufficiency 02/10/2017   Anal wart 08/07/2016   Anemia    Anxiety    Contusion of bone 01/18/2020   Deep venous thrombosis (HCC)    Depression    External hemorrhoid 08/07/2016   Generalized anxiety disorder 08/10/2014   HIV disease (HCC) 08/13/2017   Hypertension 08/22/2020   Internal hemorrhoid 08/07/2016   Osteoarthritis 01/02/2021   Panic attack 08/10/2014   PCP (pneumocystis jiroveci pneumonia) (HCC)    Syphilis 09/20/2015   Vaccine counseling 01/02/2021    Outpatient Medications Prior to Visit  Medication Sig Dispense Refill   dolutegravir-lamiVUDine (DOVATO) 50-300 MG tablet Take 1 tablet by mouth daily. 30 tablet 11   amLODipine (NORVASC) 5 MG tablet Take 1 tablet (5 mg total) by mouth daily. (Patient not taking: Reported on 09/11/2021) 30 tablet 11   No facility-administered medications prior to visit.    No Known Allergies  Family History   Problem Relation Age of Onset   Kidney disease Maternal Grandmother    Cancer Neg Hx    Diabetes Neg Hx         Objective:  Objective  Vitals:   09/11/21 1444  BP: (!) 157/89  Pulse: 67  Temp: 98.1 F (36.7 C)   Body mass index is 28.21 kg/m.   Physical Exam  Physical Exam       Assessment & Plan:    Problem List Items Addressed This Visit       Unprioritized   Acute hemorrhoid    Exam consistent with acute external hemorrhoid. Does not appear to be thrombosed at present but quite tender and swollen.   Recommendations:  Start anusol supp BID (medical lubricant provided) Topical hydrocortisone 2.5% cream BID  Miralax for BM 1-2 doses daily titrated for effect  Avoid prolonged sitting  Note written for work to remain out until June 29th.  Sitz baths  I did caution him that this is large enough that it may require drainage. If pain / size does not improve recommend he reach out to his PCP to see if they can arrange drainage in the office.         Rexene Alberts, MSN, NP-C [facility] Office: [phone] Pager: [phone]

## 2021-09-12 MED ORDER — HYDROCORTISONE (PERIANAL) 2.5 % EX CREA: 1.0000 | TOPICAL_CREAM | Freq: Two times a day (BID) | CUTANEOUS | 0 refills | Status: DC

## 2021-09-12 NOTE — Addendum Note (Signed)
Addended by: Blanchard Kelch on: 09/12/2021 09:55 AM   Modules accepted: Orders

## 2021-09-13 MED ORDER — ACETAMINOPHEN-CODEINE 300-30 MG PO TABS
1.0000 | ORAL_TABLET | ORAL | 0 refills | Status: AC | PRN
Start: 2021-09-13 — End: 2021-09-18

## 2021-09-13 NOTE — Addendum Note (Signed)
Addended by: Blanchard Kelch on: 09/13/2021 11:45 AM   Modules accepted: Orders

## 2021-09-13 NOTE — Progress Notes (Signed)
Patient called to let us know the pain is still quite bad and requesting pain medication stronger than tylenol. Currently he is taking 8 extra strength tylenol daily with mild improvement.   Will give him #20 tylenol#3, no refills to help with pain. Precautions discussed to not drive, eat with medication and stop any other tylenol use while he is on this pain medication. Can take ibuprofen short term if needed as well to help with inflammation.   Will extend work note out to return Monday July 3rd.   Communicated via telephone call 09/13/2021  11:43 AM    Rexene Alberts, MSN, NP-C [facility]  [email or web address] Pager: [phone] Office: [phone] [facility] Main Line: [phone] *Secure Chat Communication Welcome

## 2021-09-17 NOTE — Progress Notes (Unsigned)
ACUTE VISIT Chief Complaint  Patient presents with   Hemorrhoids   HPI: [name] is a 41 y.o. male with hx of hypertension, anxiety,anal wart, and HIV on antiviral treatment here today complaining of a week of hematochezia and dyschezia. No prior hx. He denies any constipation, he usually has daily bowel movements but states that before above problem started he had not bowel movement in 2 days.  He has had rectal bleed w/o defecation, 2 days ago he noted blood on underwear.  Rectal Bleeding  The current episode started 5 to 7 days ago. The onset was gradual. The problem occurs occasionally. The problem has been gradually improving. The pain is moderate. Prior successful therapies include laxatives. Associated symptoms include hemorrhoids and rectal pain. Pertinent negatives include no fever, no abdominal pain, no diarrhea, no hematemesis, no nausea, no vomiting, no hematuria, no chest pain, no headaches, no coughing, no difficulty breathing and no rash.  Evaluated on 09/11/21 for acute hemorrhoids, Anusol was prescribed. According the patient, he was told that he may need hemorrhoids to be "drained." He has been taking Miralax.  FHx negative for colon cancer.  Review of Systems  Constitutional:  Negative for appetite change, chills and fever.  Respiratory:  Negative for cough, shortness of breath and wheezing.   Cardiovascular:  Negative for chest pain and leg swelling.  Gastrointestinal:  Positive for hematochezia, hemorrhoids and rectal pain. Negative for abdominal pain, diarrhea, hematemesis, nausea and vomiting.  Genitourinary:  Negative for dysuria and hematuria.  Skin:  Negative for rash.  Neurological:  Negative for syncope and headaches.  Hematological:  Negative for adenopathy. Does not bruise/bleed easily.  Rest see pertinent positives and negatives per HPI.  Current Outpatient Medications on File Prior to Visit  Medication Sig Dispense Refill   amLODipine  (NORVASC) 5 MG tablet Take 1 tablet (5 mg total) by mouth daily. 30 tablet 11   dolutegravir-lamiVUDine (DOVATO) 50-300 MG tablet Take 1 tablet by mouth daily. 30 tablet 11   No current facility-administered medications on file prior to visit.   Past Medical History:  Diagnosis Date   Acute renal insufficiency 02/10/2017   Anal wart 08/07/2016   Anemia    Anxiety    Contusion of bone 01/18/2020   Deep venous thrombosis (HCC)    Depression    External hemorrhoid 08/07/2016   Generalized anxiety disorder 08/10/2014   HIV disease (HCC) 08/13/2017   Hypertension 08/22/2020   Internal hemorrhoid 08/07/2016   Osteoarthritis 01/02/2021   Panic attack 08/10/2014   PCP (pneumocystis jiroveci pneumonia) (HCC)    Syphilis 09/20/2015   Vaccine counseling 01/02/2021   No Known Allergies  Social History   Socioeconomic History   Marital status: Single    Spouse name: Not on file   Number of children: Not on file   Years of education: Not on file   Highest education level: Not on file  Occupational History   Not on file  Tobacco Use   Smoking status: Never   Smokeless tobacco: Never  Vaping Use   Vaping Use: Never used  Substance and Sexual Activity   Alcohol use: Not Currently    Alcohol/week: 1.0 standard drink of alcohol    Types: 1 Standard drinks or equivalent per week    Comment: wine   Drug use: Not Currently    Types: Marijuana   Sexual activity: Not Currently    Partners: Male    Comment: declined condoms  Other Topics Concern   Not  on file  Social History Narrative   Not on file   Social Determinants of Health   Financial Resource Strain: Not on file  Food Insecurity: Not on file  Transportation Needs: Not on file  Physical Activity: Not on file  Stress: Not on file  Social Connections: Not on file   Vitals:   09/19/21 0704  BP: 128/80  Pulse: 88  Resp: 12  SpO2: 98%   Body mass index is 28.83 kg/m.  Physical Exam Vitals and nursing note reviewed. Exam  conducted with a chaperone present.  Constitutional:      General: He is not in acute distress.    Appearance: He is well-developed.  HENT:     Head: Normocephalic and atraumatic.  Eyes:     Conjunctiva/sclera: Conjunctivae normal.  Cardiovascular:     Rate and Rhythm: Normal rate and regular rhythm.     Heart sounds: No murmur heard. Pulmonary:     Effort: Pulmonary effort is normal. No respiratory distress.     Breath sounds: Normal breath sounds.  Abdominal:     Palpations: Abdomen is soft. There is no mass.     Tenderness: There is no abdominal tenderness.  Genitourinary:    Rectum: Tenderness and external hemorrhoid present. No anal fissure.     Comments: Edematous/thrombosed around 3 O'clock(lying on left side) with small ulcer. No active bleeding, very tender upon light touch. Digital rectal exam was not done due to pain. Lymphadenopathy:     Cervical: No cervical adenopathy.  Skin:    General: Skin is warm.     Findings: No erythema or rash.  Neurological:     Mental Status: He is alert and oriented to person, place, and time.     Cranial Nerves: No cranial nerve deficit.     Gait: Gait normal.  Psychiatric:        Mood and Affect: Mood is anxious.     Comments: Well groomed, good eye contact.   ASSESSMENT AND PLAN:  [name] was seen today for hemorrhoids.  Diagnoses and all orders for this visit: Orders Placed This Encounter  Procedures   Ambulatory referral to Gastroenterology   External hemorrhoid, thrombosed Educated about diagnosis, prognosis, and treatment options. Problem has mildly improved. Continue hydrocortisone 2.5% suppositories twice daily as needed. Start sitz bath with warm water 1-2 per day for 10-15 min. Topical Lidocaine 3% for pain management. Avoid straining or constipation. Because of comorbidities, I think it is appropriate to have a GI evaluation to determine the need for rectosigmoidoscopy/colonoscopy.  -     hydrocortisone  (ANUSOL-HC) 25 MG suppository; Place 1 suppository (25 mg total) rectally 2 (two) times daily. -     Lidocaine 3 % CREA; Apply 1 Application topically 3 (three) times daily as needed for up to 10 days.  Constipation, unspecified constipation type It seems like he has short episode of constipation that could have caused above problem. Adequate fiber and fluid that recommended. Continue MiraLAX daily as needed.  I spent a total of 35 minutes in both face to face and non face to face activities for this visit on the date of this encounter. During this time history was obtained and documented, examination was performed, and assessment/plan discussed.  Return if symptoms worsen or fail to improve.  Ziah Turvey G. [place], MD  [facility]. [place] office.

## 2021-09-19 ENCOUNTER — Encounter: Payer: Self-pay | Admitting: Family Medicine

## 2021-09-19 ENCOUNTER — Ambulatory Visit (INDEPENDENT_AMBULATORY_CARE_PROVIDER_SITE_OTHER): Payer: 59 | Admitting: Family Medicine

## 2021-09-19 VITALS — BP 128/80 | HR 88 | Resp 12 | Ht 69.0 in | Wt 195.2 lb

## 2021-09-19 DIAGNOSIS — K645 Perianal venous thrombosis: Secondary | ICD-10-CM | POA: Diagnosis not present

## 2021-09-19 DIAGNOSIS — K5900 Constipation, unspecified: Secondary | ICD-10-CM | POA: Diagnosis not present

## 2021-09-19 MED ORDER — LIDOCAINE 3 % EX CREA: 1.0000 | TOPICAL_CREAM | Freq: Three times a day (TID) | CUTANEOUS | 0 refills | Status: AC | PRN

## 2021-09-19 MED ORDER — HYDROCORTISONE ACETATE 25 MG RE SUPP: 25.0000 mg | Freq: Two times a day (BID) | RECTAL | 0 refills | Status: DC

## 2021-09-19 NOTE — Patient Instructions (Addendum)
A few things to remember from today's visit:  External hemorrhoid, thrombosed - Plan: Ambulatory referral to Gastroenterology, Lidocaine 3 % CREA  Constipation, unspecified constipation type  If you need refills please call your pharmacy. Do not use My Chart to request refills or for acute issues that need immediate attention.   Sitz baths 2 times daily with warm water. Continue miralax to avoid constipation. Continue either cream or suppositories of hydrocortisone. Lidocaine cream can be used for pain. Appointment with gastroenterology will be arranged.  Please be sure medication list is accurate. If a new problem present, please set up appointment sooner than planned today.

## 2021-10-17 ENCOUNTER — Ambulatory Visit: Payer: 59 | Admitting: Infectious Disease

## 2021-11-08 ENCOUNTER — Encounter: Payer: Self-pay | Admitting: Infectious Disease

## 2021-11-08 ENCOUNTER — Ambulatory Visit (INDEPENDENT_AMBULATORY_CARE_PROVIDER_SITE_OTHER): Payer: 59 | Admitting: Infectious Disease

## 2021-11-08 ENCOUNTER — Other Ambulatory Visit (HOSPITAL_COMMUNITY)
Admission: RE | Admit: 2021-11-08 | Discharge: 2021-11-08 | Disposition: A | Discharge status: Home / Self Care | Payer: 59 | Source: Ambulatory Visit | Attending: Infectious Disease | Admitting: Infectious Disease

## 2021-11-08 ENCOUNTER — Other Ambulatory Visit: Payer: Self-pay

## 2021-11-08 VITALS — BP 148/91 | HR 61 | Temp 97.3°F | Ht 69.0 in | Wt 198.0 lb

## 2021-11-08 DIAGNOSIS — B20 Human immunodeficiency virus [HIV] disease: Secondary | ICD-10-CM | POA: Diagnosis present

## 2021-11-08 DIAGNOSIS — E785 Hyperlipidemia, unspecified: Secondary | ICD-10-CM

## 2021-11-08 DIAGNOSIS — I10 Essential (primary) hypertension: Secondary | ICD-10-CM | POA: Diagnosis not present

## 2021-11-08 DIAGNOSIS — K649 Unspecified hemorrhoids: Secondary | ICD-10-CM

## 2021-11-08 DIAGNOSIS — Z113 Encounter for screening for infections with a predominantly sexual mode of transmission: Secondary | ICD-10-CM

## 2021-11-08 DIAGNOSIS — Z7185 Encounter for immunization safety counseling: Secondary | ICD-10-CM

## 2021-11-08 DIAGNOSIS — A630 Anogenital (venereal) warts: Secondary | ICD-10-CM

## 2021-11-08 MED ORDER — AMLODIPINE BESYLATE 5 MG PO TABS
5.0000 mg | ORAL_TABLET | Freq: Every day | ORAL | 11 refills | Status: DC
Start: 2021-11-08 — End: 2022-06-19

## 2021-11-08 MED ORDER — ROSUVASTATIN CALCIUM 20 MG PO TABS: 20.0000 mg | ORAL_TABLET | Freq: Every day | ORAL | 11 refills | Status: DC

## 2021-11-08 MED ORDER — DOVATO 50-300 MG PO TABS: 1.0000 | ORAL_TABLET | Freq: Every day | ORAL | 11 refills | Status: DC

## 2021-11-08 NOTE — Progress Notes (Signed)
Chief complaint: follow-up for HIV disease on medications namely Dovato Subjective:    Patient ID: Lance Craig, male    DOB: 04-20-1980, 41 y.o.   MRN: [id number]  HPI  Lance Craig is a 41 y.o. male with history of HIV/AIDS w initially treated with Atripla eventually changed over to Triumeq which she is maintained perfect virological suppression.  He does  not have hepatitis B coinfection.    I was able to convince him to switch to Dovato and he has maintained perfect suppression.  I added amlodipine 5mg  at last visit for BP control.  However he would never began taking this medication as he was trying to find other ways to lower his blood pressure.  He did suffer thrombosed significant external hemorrhoid for which he saw and then his primary care physician Betty Rexene Alberts.  He says that that is improved now and seems to not nearly be as painful anymore.  I do think would be prudent given his history of anal warts that he would be plugged in to general surgery and have regular high resolution anoscopy to screen for rectal cancer.  Additionally if his hemorrhoid might require surgery it  would be quite helpful to have them following him.        Past Medical History:  Diagnosis Date   Acute renal insufficiency 02/10/2017   Anal wart 08/07/2016   Anemia    Anxiety    Contusion of bone 01/18/2020   Deep venous thrombosis (HCC)    Depression    External hemorrhoid 08/07/2016   Generalized anxiety disorder 08/10/2014   HIV disease (HCC) 08/13/2017   Hypertension 08/22/2020   Internal hemorrhoid 08/07/2016   Osteoarthritis 01/02/2021   Panic attack 08/10/2014   PCP (pneumocystis jiroveci pneumonia) (HCC)    Syphilis 09/20/2015   Vaccine counseling 01/02/2021    No past surgical history on file.  Family History  Problem Relation Age of Onset   Kidney disease Maternal Grandmother    Cancer Neg Hx    Diabetes Neg Hx       Social History   Socioeconomic  History   Marital status: Single    Spouse name: Not on file   Number of children: Not on file   Years of education: Not on file   Highest education level: Not on file  Occupational History   Not on file  Tobacco Use   Smoking status: Never   Smokeless tobacco: Never  Vaping Use   Vaping Use: Never used  Substance and Sexual Activity   Alcohol use: Not Currently    Alcohol/week: 1.0 standard drink of alcohol    Types: 1 Standard drinks or equivalent per week    Comment: wine   Drug use: Not Currently    Types: Marijuana   Sexual activity: Not Currently    Partners: Male    Comment: declined condoms  Other Topics Concern   Not on file  Social History Narrative   Not on file   Social Determinants of Health   Financial Resource Strain: Not on file  Food Insecurity: Not on file  Transportation Needs: Not on file  Physical Activity: Not on file  Stress: Not on file  Social Connections: Not on file    No Known Allergies    Current Outpatient Medications:    amLODipine (NORVASC) 5 MG tablet, Take 1 tablet (5 mg total) by mouth daily., Disp: 30 tablet, Rfl: 11   dolutegravir-lamiVUDine (DOVATO) 50-300 MG tablet, Take 1  tablet by mouth daily., Disp: 30 tablet, Rfl: 11   hydrocortisone (ANUSOL-HC) 25 MG suppository, Place 1 suppository (25 mg total) rectally 2 (two) times daily., Disp: 12 suppository, Rfl: 0          Review of Systems  Constitutional:  Negative for activity change, appetite change, chills, diaphoresis, fatigue, fever and unexpected weight change.  HENT:  Negative for congestion, rhinorrhea, sinus pressure, sneezing, sore throat and trouble swallowing.   Eyes:  Negative for photophobia and visual disturbance.  Respiratory:  Negative for cough, chest tightness, shortness of breath, wheezing and stridor.   Cardiovascular:  Negative for chest pain, palpitations and leg swelling.  Gastrointestinal:  Negative for abdominal distention, abdominal pain, anal  bleeding, blood in stool, constipation, diarrhea, nausea and vomiting.  Genitourinary:  Negative for difficulty urinating, dysuria, flank pain and hematuria.  Musculoskeletal:  Negative for arthralgias, back pain, gait problem, joint swelling and myalgias.  Skin:  Negative for color change, pallor, rash and wound.  Neurological:  Negative for dizziness, tremors, weakness and light-headedness.  Hematological:  Negative for adenopathy. Does not bruise/bleed easily.  Psychiatric/Behavioral:  Negative for agitation, behavioral problems, confusion, decreased concentration, dysphoric mood and sleep disturbance.        Objective:   Physical Exam Constitutional:      Appearance: He is well-developed.  HENT:     Head: Normocephalic and atraumatic.  Eyes:     Conjunctiva/sclera: Conjunctivae normal.  Cardiovascular:     Rate and Rhythm: Normal rate and regular rhythm.  Pulmonary:     Effort: Pulmonary effort is normal. No respiratory distress.     Breath sounds: No wheezing.  Abdominal:     General: There is no distension.     Palpations: Abdomen is soft.  Musculoskeletal:        General: No tenderness. Normal range of motion.     Cervical back: Normal range of motion and neck supple.  Skin:    General: Skin is warm and dry.     Coloration: Skin is not pale.     Findings: No erythema or rash.  Neurological:     Mental Status: He is alert and oriented to person, place, and time.  Psychiatric:        Mood and Affect: Mood normal.        Behavior: Behavior normal.        Thought Content: Thought content normal.        Judgment: Judgment normal.           Assessment & Plan:   HIV disease:  I we will recheck HIV viral load CD4 CBC CMP RPR gonorrhea chlamydia  I am continue his Dovato prescription  Lab Results  Component Value Date   HIV1RNAQUANT Not Detected 04/18/2021   Reviewed CD4 count which was 502 Lab Results  Component Value Date   CD4TABS 502 08/22/2020    CD4TABS 576 01/18/2020   CD4TABS 455 06/22/2019    Hypertension:  Have sent a prescription for amlodipine have counseled him about the risks of untreated hypertension and how this pathology can unfold over time.   History of anal warts: I have put a referral into [facility] surgery with Dr. Maisie Fus.  My last note indicated that I had also done so so I hope that he sees her I have told him that this fortunately has insurance and that we can have him establish care with [facility] since we do not yet do high resolution anoscopy in our clinic  since Dr. Ninetta Lights moved out.  Vaccine counseling I recommended to get updated flu shot and that he get a COVID-19 vaccination though he has never had 1.  Cardiovascular prevention and hyperlipidemia: I did review the results of the reprieve study and I will initiate a statin for him pitavastatin was not on formularly so I started crestor  I spent 41 minutes with the patient including than 50% of the time in face to face counseling of the patient regarding need for control of blood pressure preventative strategies to prevent cardiovascular disease and to screen for rectal cancer  along with review of medical records in preparation for the visit and during the visit and in coordination of his care.

## 2021-11-09 LAB — T-HELPER CELLS (CD4) COUNT (NOT AT ARMC)
CD4 % Helper T Cell: 27 % — ABNORMAL LOW (ref 33–65)
CD4 T Cell Abs: 511 /uL (ref 400–1790)

## 2021-11-09 LAB — URINE CYTOLOGY ANCILLARY ONLY
Chlamydia: NEGATIVE
Comment: NEGATIVE
Comment: NORMAL
Neisseria Gonorrhea: NEGATIVE

## 2021-11-13 LAB — COMPLETE METABOLIC PANEL WITH GFR
AG Ratio: 1.7 (calc) (ref 1.0–2.5)
ALT: 18 U/L (ref 9–46)
AST: 22 U/L (ref 10–40)
Albumin: 4.7 g/dL (ref 3.6–5.1)
Alkaline phosphatase (APISO): 78 U/L (ref 36–130)
BUN/Creatinine Ratio: 10 (calc) (ref 6–22)
BUN: 14 mg/dL (ref 7–25)
CO2: 23 mmol/L (ref 20–32)
Calcium: 9.4 mg/dL (ref 8.6–10.3)
Chloride: 104 mmol/L (ref 98–110)
Creat: 1.36 mg/dL — ABNORMAL HIGH (ref 0.60–1.29)
Globulin: 2.8 g/dL (calc) (ref 1.9–3.7)
Glucose, Bld: 84 mg/dL (ref 65–99)
Potassium: 3.8 mmol/L (ref 3.5–5.3)
Sodium: 139 mmol/L (ref 135–146)
Total Bilirubin: 0.4 mg/dL (ref 0.2–1.2)
Total Protein: 7.5 g/dL (ref 6.1–8.1)
eGFR: 67 mL/min/{1.73_m2} (ref >=60)

## 2021-11-13 LAB — CBC WITH DIFFERENTIAL/PLATELET
Absolute Monocytes: 573 cells/uL (ref 200–950)
Basophils Absolute: 49 cells/uL (ref 0–200)
Basophils Relative: 1 %
Eosinophils Absolute: 250 cells/uL (ref 15–500)
Eosinophils Relative: 5.1 %
HCT: 41.2 % (ref 38.5–50.0)
Hemoglobin: 14.2 g/dL (ref 13.2–17.1)
Lymphs Abs: 2092 cells/uL (ref 850–3900)
MCH: 30.4 pg (ref 27.0–33.0)
MCHC: 34.5 g/dL (ref 32.0–36.0)
MCV: 88.2 fL (ref 80.0–100.0)
MPV: 9.7 fL (ref 7.5–12.5)
Monocytes Relative: 11.7 %
Neutro Abs: 1936 cells/uL (ref 1500–7800)
Neutrophils Relative %: 39.5 %
Platelets: 271 10*3/uL (ref 140–400)
RBC: 4.67 10*6/uL (ref 4.20–5.80)
RDW: 13.5 % (ref 11.0–15.0)
Total Lymphocyte: 42.7 %
WBC: 4.9 10*3/uL (ref 3.8–10.8)

## 2021-11-13 LAB — HIV-1 RNA QUANT-NO REFLEX-BLD
HIV 1 RNA Quant: NOT DETECTED Copies/mL
HIV-1 RNA Quant, Log: NOT DETECTED Log cps/mL

## 2021-11-13 LAB — FLUORESCENT TREPONEMAL AB(FTA)-IGG-BLD: Fluorescent Treponemal ABS: REACTIVE — AB

## 2021-11-13 LAB — LIPID PANEL
Cholesterol: 197 mg/dL (ref <200)
HDL: 58 mg/dL (ref >=40)
LDL Cholesterol (Calc): 114 mg/dL (calc) — ABNORMAL HIGH
Non-HDL Cholesterol (Calc): 139 mg/dL (calc) — ABNORMAL HIGH (ref <130)
Total CHOL/HDL Ratio: 3.4 (calc) (ref <5.0)
Triglycerides: 133 mg/dL (ref <150)

## 2021-11-13 LAB — RPR: RPR Ser Ql: REACTIVE — AB

## 2021-11-13 LAB — RPR TITER: RPR Titer: 1:2 {titer} — ABNORMAL HIGH

## 2022-03-23 IMAGING — CR DG KNEE COMPLETE 4+V*L*
4 series · 4 of 4 positions shown · non-contrast
Comparison: None.

CLINICAL DATA: Left knee pain and swelling.  Fall a month ago.

EXAM:
LEFT KNEE - COMPLETE 4+ VIEW

[t knee ap left]
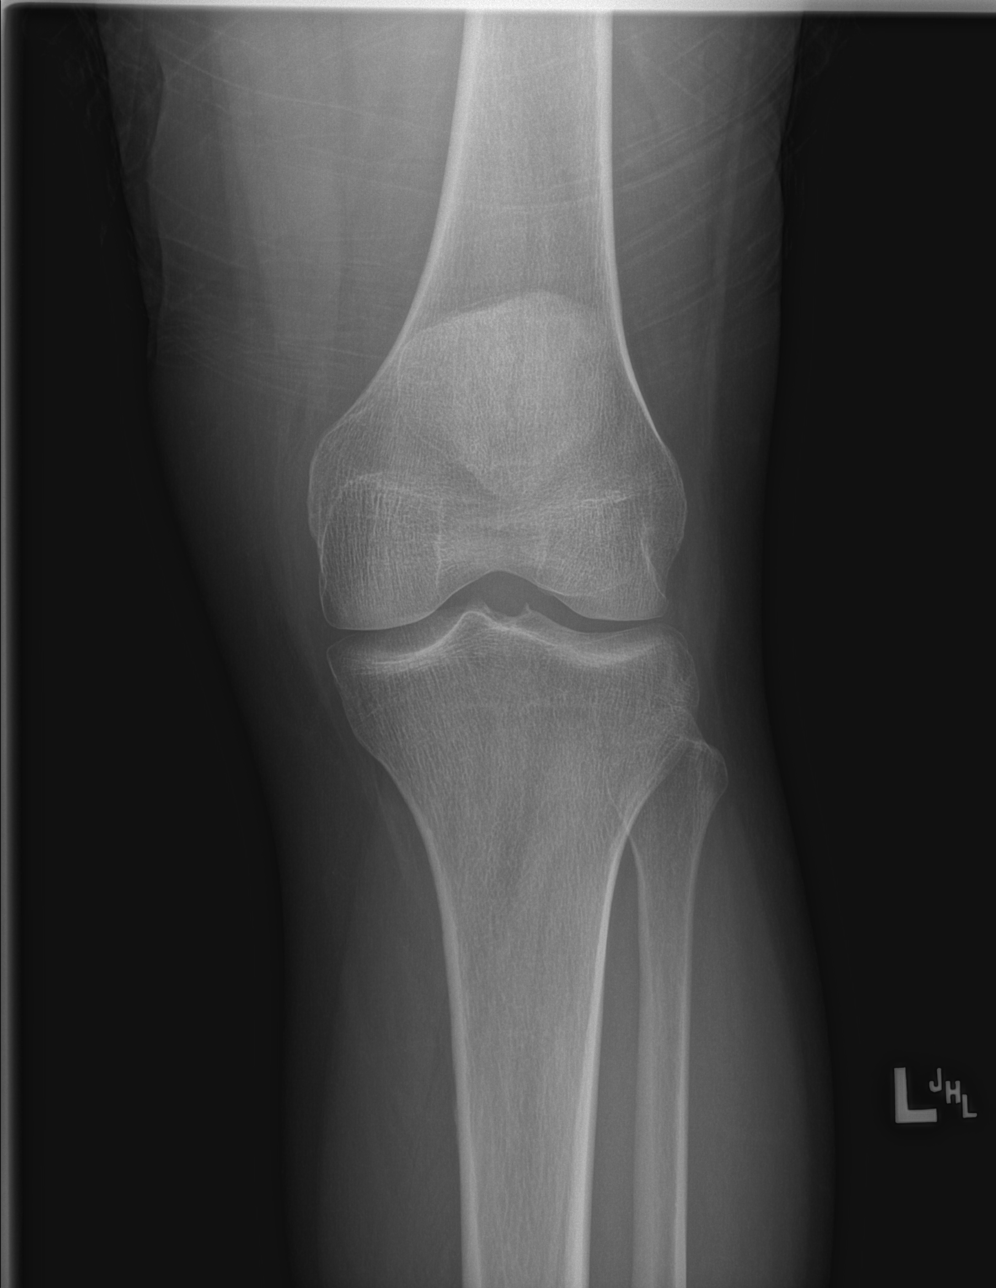

[t knee obl left (1 of 2)]
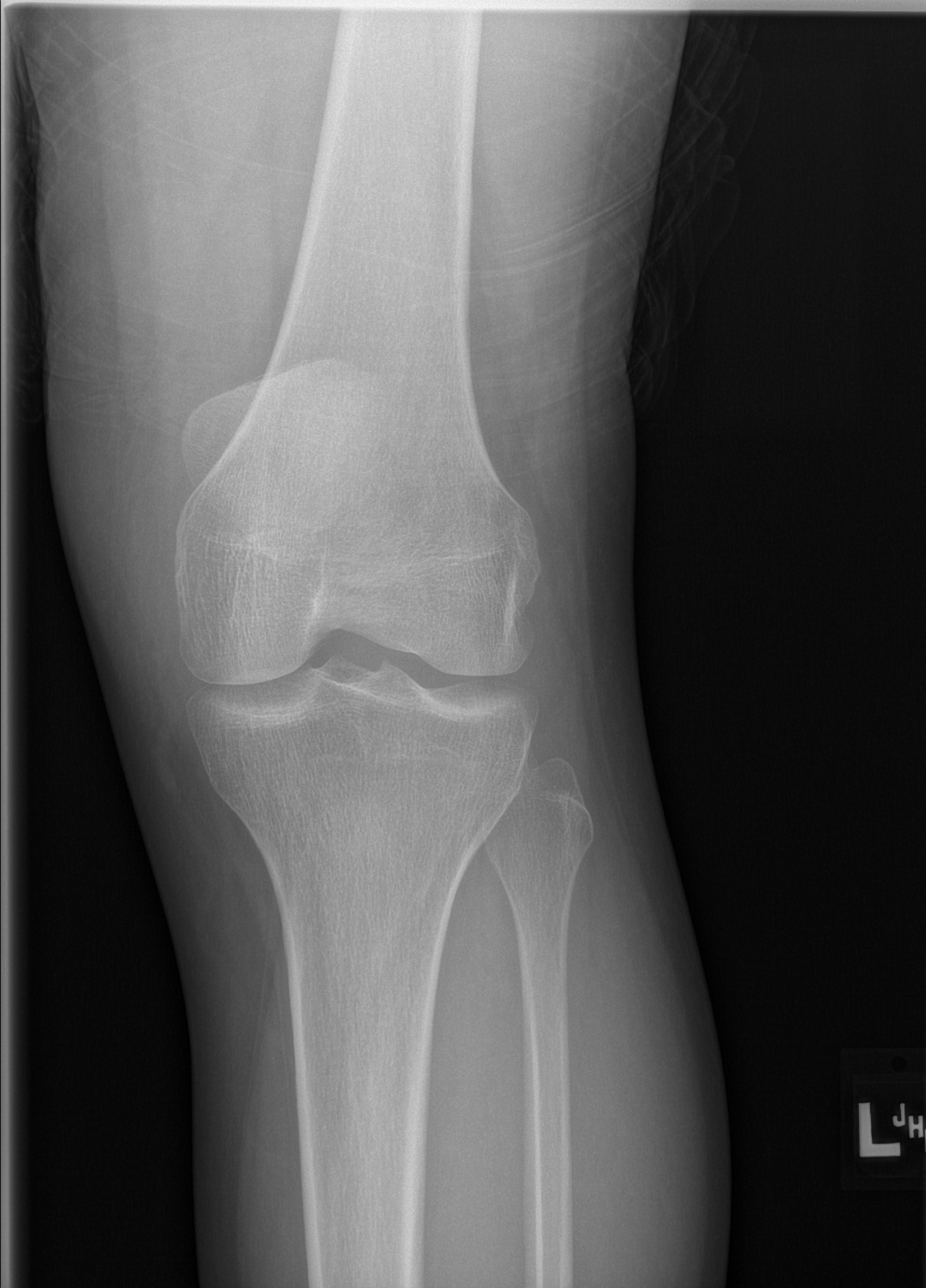

[t knee obl left (2 of 2)]
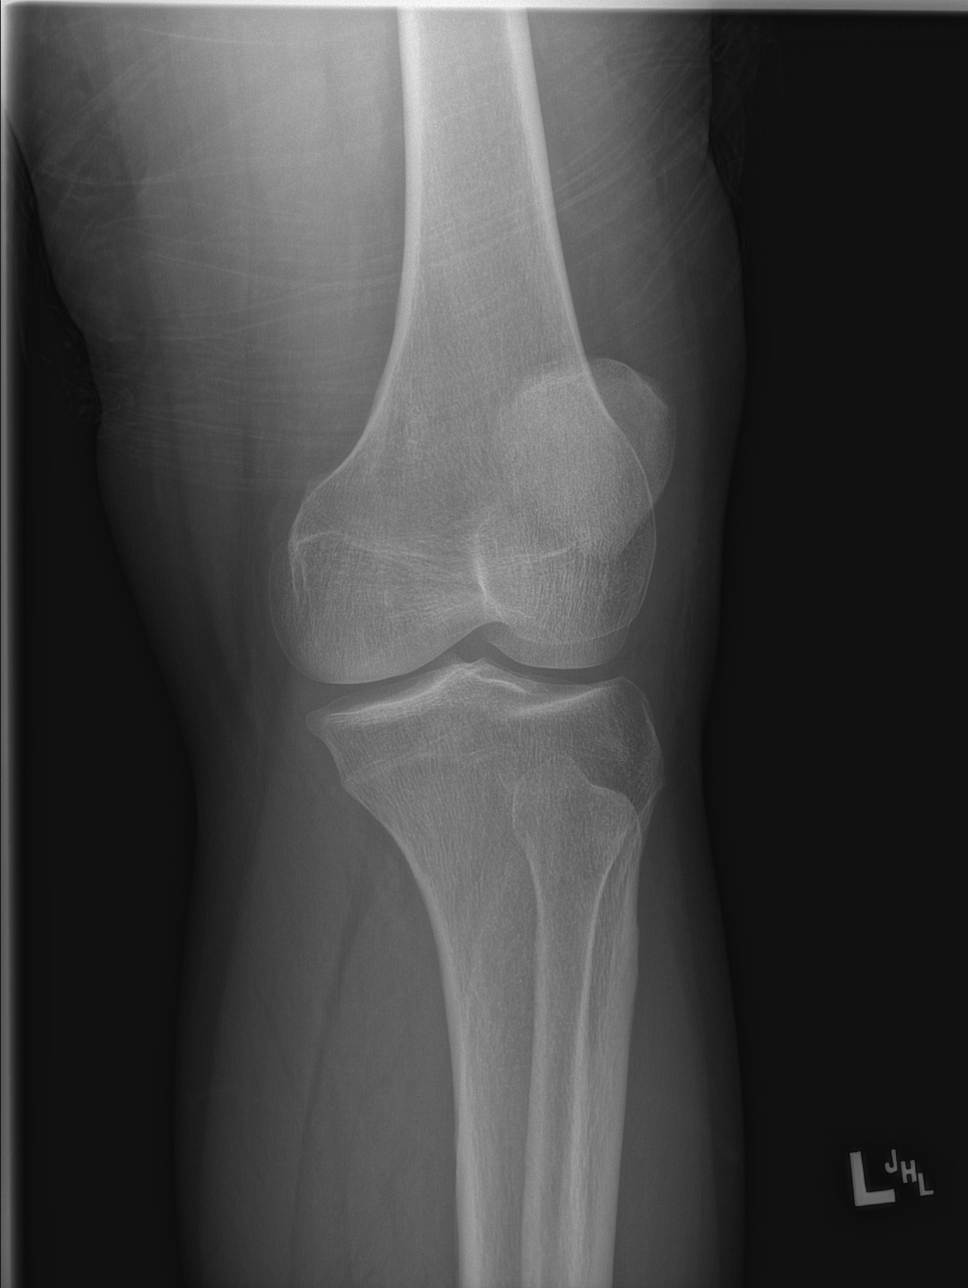

[t knee lat left]
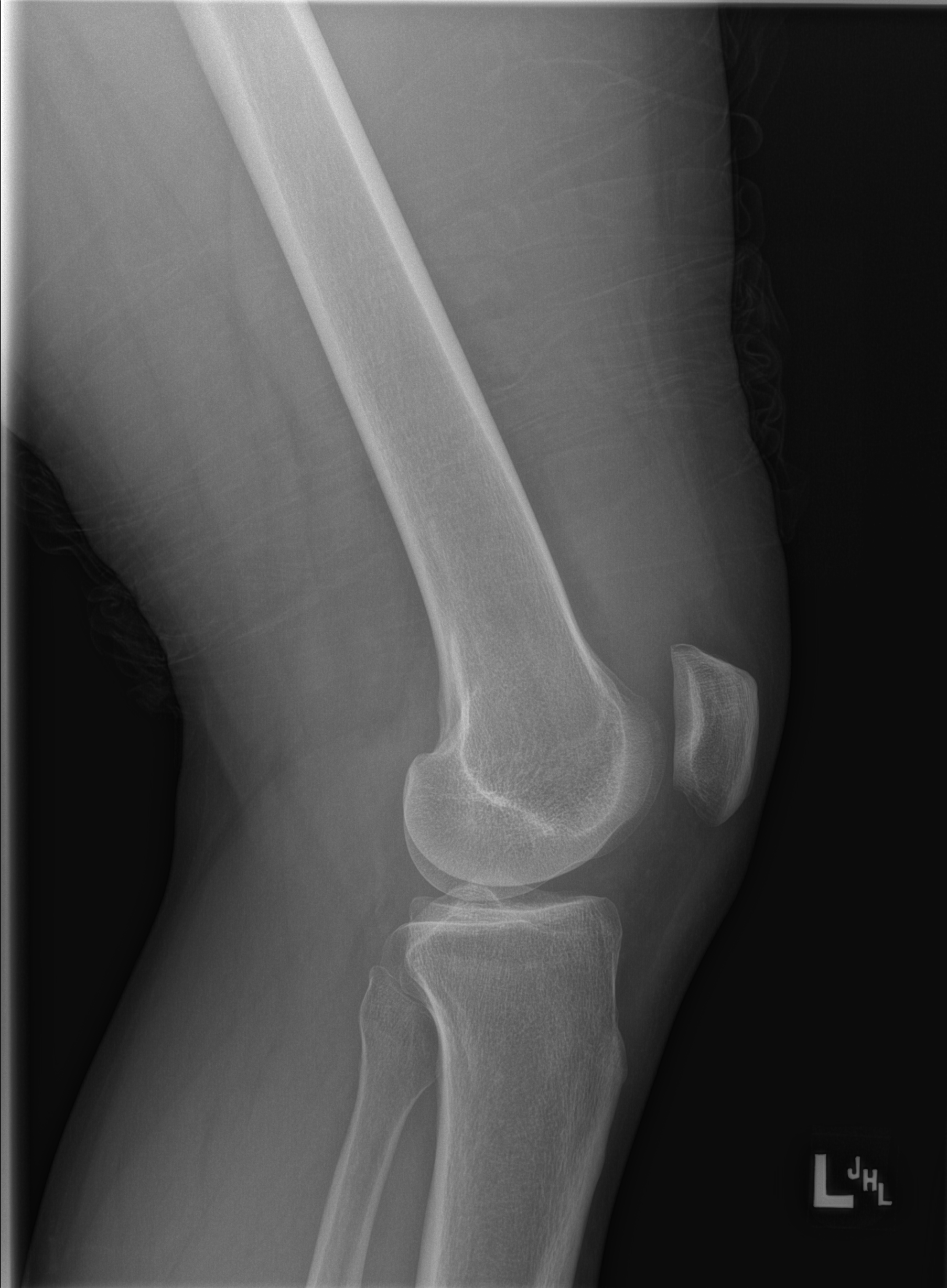

[4 of 4 positions shown; findings below may reference images not displayed]

FINDINGS: No acute fracture or dislocation. Small to moderate joint effusion.
Joint spaces are preserved. Bone mineralization is normal. Soft
tissues are unremarkable.
IMPRESSION: 1. Small to moderate joint effusion. No acute osseous abnormality or
significant degenerative changes.

## 2022-05-13 ENCOUNTER — Ambulatory Visit: Payer: 59 | Admitting: Infectious Disease

## 2022-06-19 ENCOUNTER — Ambulatory Visit (INDEPENDENT_AMBULATORY_CARE_PROVIDER_SITE_OTHER): Payer: 59 | Admitting: Infectious Disease

## 2022-06-19 ENCOUNTER — Encounter: Payer: Self-pay | Admitting: Infectious Disease

## 2022-06-19 ENCOUNTER — Other Ambulatory Visit (HOSPITAL_COMMUNITY)
Admission: RE | Admit: 2022-06-19 | Discharge: 2022-06-19 | Disposition: A | Discharge status: Home / Self Care | Payer: 59 | Source: Ambulatory Visit | Attending: Infectious Disease | Admitting: Infectious Disease

## 2022-06-19 ENCOUNTER — Other Ambulatory Visit: Payer: Self-pay

## 2022-06-19 VITALS — BP 127/85 | HR 71 | Temp 97.3°F | Wt 197.0 lb

## 2022-06-19 DIAGNOSIS — A630 Anogenital (venereal) warts: Secondary | ICD-10-CM | POA: Insufficient documentation

## 2022-06-19 DIAGNOSIS — Z7185 Encounter for immunization safety counseling: Secondary | ICD-10-CM | POA: Insufficient documentation

## 2022-06-19 DIAGNOSIS — B20 Human immunodeficiency virus [HIV] disease: Secondary | ICD-10-CM | POA: Insufficient documentation

## 2022-06-19 DIAGNOSIS — I10 Essential (primary) hypertension: Secondary | ICD-10-CM | POA: Diagnosis not present

## 2022-06-19 DIAGNOSIS — Z23 Encounter for immunization: Secondary | ICD-10-CM | POA: Diagnosis not present

## 2022-06-19 DIAGNOSIS — E785 Hyperlipidemia, unspecified: Secondary | ICD-10-CM | POA: Diagnosis not present

## 2022-06-19 HISTORY — DX: Hyperlipidemia, unspecified: E78.5

## 2022-06-19 MED ORDER — AMLODIPINE BESYLATE 5 MG PO TABS: 5.0000 mg | ORAL_TABLET | Freq: Every day | ORAL | 11 refills | Status: DC

## 2022-06-19 MED ORDER — ATORVASTATIN CALCIUM 20 MG PO TABS: 20.0000 mg | ORAL_TABLET | Freq: Every day | ORAL | 11 refills | Status: DC

## 2022-06-19 MED ORDER — DOVATO 50-300 MG PO TABS: 1.0000 | ORAL_TABLET | Freq: Every day | ORAL | 11 refills | Status: DC

## 2022-06-19 NOTE — Progress Notes (Signed)
Chief complaint: Follow-up for HIV disease on medications   Patient ID: Lance Craig, male    DOB: 09/22/1980, 42 y.o.   MRN: [id number]  HPI  Lance Craig is a 42 y.o. male with history of HIV/AIDS w initially treated with Atripla eventually changed over to Triumeq which she is maintained perfect virological suppression.  He does  not have hepatitis B coinfection.    I was able to convince him to switch to Dovato and he has maintained perfect suppression.  I added amlodipine 5mg  at last visit for BP control.  However he would never began taking this medication as he was trying to find other ways to lower his blood pressure.  He did suffer thrombosed significant external hemorrhoid for which he saw Lance Craig and then his primary care physician Lance Craig.  He saidthat that is improved now and seems to not nearly be as painful anymore.  I do think would be prudent given his history of anal warts that he would be plugged in to general surgery and have regular high resolution anoscopy to screen for rectal cancer.  Additionally if his hemorrhoid might require surgery it  would be quite helpful to have them following him.   It sounds as if they did try to call him for an appointment but called his landline when he is out of town rather than his mobile phone.  I will make another referral to them.  He does have Pharmacist, community and could fill his medications at [facility] but for now wants to continue to receive them at [facility] on [address].  He tried to fill his Crestor though at the time he tried to fill it he was presented with a $50 co-pay.  He is not sure if this is due to to the Crestor itself or due to him feeling all medications at the same time I inquired if he is paying anything for his Dovato as he should not be with the help of the connects.   He was interested in [place] and we discussed pros and cons of this long-acting medication    Past Medical  History:  Diagnosis Date   Acute renal insufficiency 02/10/2017   Anal wart 08/07/2016   Anemia    Anxiety    Contusion of bone 01/18/2020   Deep venous thrombosis (HCC)    Depression    External hemorrhoid 08/07/2016   Generalized anxiety disorder 08/10/2014   HIV disease ([place]) 08/13/2017   Hypertension 08/22/2020   Internal hemorrhoid 08/07/2016   Osteoarthritis 01/02/2021   Panic attack 08/10/2014   PCP (pneumocystis jiroveci pneumonia) ([place])    Syphilis 09/20/2015   Vaccine counseling 01/02/2021    No past surgical history on file.  Family History  Problem Relation Age of Onset   Kidney disease Maternal Grandmother    Cancer Neg Hx    Diabetes Neg Hx       Social History   Socioeconomic History   Marital status: Single    Spouse name: Not on file   Number of children: Not on file   Years of education: Not on file   Highest education level: Not on file  Occupational History   Not on file  Tobacco Use   Smoking status: Never   Smokeless tobacco: Never  Vaping Use   Vaping Use: Never used  Substance and Sexual Activity   Alcohol use: Yes    Alcohol/week: 1.0 standard drink of alcohol    Types: 1 Standard  drinks or equivalent per week    Comment: socially   Drug use: Yes    Types: Marijuana    Comment: smokes socially   Sexual activity: Not Currently    Partners: Male    Comment: declined condoms  Other Topics Concern   Not on file  Social History Narrative   Not on file   Social Determinants of Health   Financial Resource Strain: Not on file  Food Insecurity: Not on file  Transportation Needs: Not on file  Physical Activity: Not on file  Stress: Not on file  Social Connections: Not on file    No Known Allergies    Current Outpatient Medications:    amLODipine (NORVASC) 5 MG tablet, Take 1 tablet (5 mg total) by mouth daily., Disp: 30 tablet, Rfl: 11   dolutegravir-lamiVUDine (DOVATO) 50-300 MG tablet, Take 1 tablet by mouth daily., Disp: 30 tablet,  Rfl: 11   hydrocortisone (ANUSOL-HC) 25 MG suppository, Place 1 suppository (25 mg total) rectally 2 (two) times daily., Disp: 12 suppository, Rfl: 0   rosuvastatin (CRESTOR) 20 MG tablet, Take 1 tablet (20 mg total) by mouth daily., Disp: 30 tablet, Rfl: 11          Review of Systems  Constitutional:  Negative for activity change, appetite change, chills, diaphoresis, fatigue, fever and unexpected weight change.  HENT:  Negative for congestion, rhinorrhea, sinus pressure, sneezing, sore throat and trouble swallowing.   Eyes:  Negative for photophobia and visual disturbance.  Respiratory:  Negative for cough, chest tightness, shortness of breath, wheezing and stridor.   Cardiovascular:  Negative for chest pain, palpitations and leg swelling.  Gastrointestinal:  Negative for abdominal distention, abdominal pain, anal bleeding, blood in stool, constipation, diarrhea, nausea and vomiting.  Genitourinary:  Negative for difficulty urinating, dysuria, flank pain and hematuria.  Musculoskeletal:  Negative for arthralgias, back pain, gait problem, joint swelling and myalgias.  Skin:  Negative for color change, pallor, rash and wound.  Neurological:  Negative for dizziness, tremors, weakness and light-headedness.  Hematological:  Negative for adenopathy. Does not bruise/bleed easily.  Psychiatric/Behavioral:  Negative for agitation, behavioral problems, confusion, decreased concentration, dysphoric mood and sleep disturbance.        Objective:   Physical Exam Constitutional:      Appearance: He is well-developed.  HENT:     Head: Normocephalic and atraumatic.  Eyes:     Conjunctiva/sclera: Conjunctivae normal.  Cardiovascular:     Rate and Rhythm: Normal rate and regular rhythm.  Pulmonary:     Effort: Pulmonary effort is normal. No respiratory distress.     Breath sounds: No wheezing.  Abdominal:     General: There is no distension.     Palpations: Abdomen is soft.  Musculoskeletal:         General: No tenderness. Normal range of motion.     Cervical back: Normal range of motion and neck supple.  Skin:    General: Skin is warm and dry.     Coloration: Skin is not pale.     Findings: No erythema or rash.  Neurological:     General: No focal deficit present.     Mental Status: He is alert and oriented to person, place, and time.  Psychiatric:        Mood and Affect: Mood normal.        Behavior: Behavior normal.        Thought Content: Thought content normal.        Judgment: Judgment  normal.           Assessment & Plan:   HIV disease:  I will add order HIV viral load CD4 count CBC with differential CMP, RPR GC and chlamydia and I will continue  Morene Crocker Lechtenberg's  Dovato prescription  He is interested in [place] and will weigh the pros and cons.  I specifically went over the 1% virological failure rate with on-time injections which is really the biggest issue I think our patients need to consider weaning considering this medication that along with making sure that they come to appointments within target date range  Hyperlipidemia: I we will see if atorvastatin is cheaper for him  Hypertension I am renewing his amlodipine that he is started  Hypertension continue  Anal warts: [facility] to [facility].  Vaccine counseling recommended updated COVID-19 shot which she refused and recommended tetanus shot which he received

## 2022-06-20 LAB — CYTOLOGY, (ORAL, ANAL, URETHRAL) ANCILLARY ONLY
Chlamydia: NEGATIVE
Chlamydia: POSITIVE — AB
Comment: NEGATIVE
Comment: NEGATIVE
Comment: NORMAL
Comment: NORMAL
Neisseria Gonorrhea: POSITIVE — AB
Neisseria Gonorrhea: POSITIVE — AB

## 2022-06-20 LAB — URINE CYTOLOGY ANCILLARY ONLY
Chlamydia: NEGATIVE
Comment: NEGATIVE
Comment: NORMAL
Neisseria Gonorrhea: NEGATIVE

## 2022-06-20 LAB — T-HELPER CELLS (CD4) COUNT (NOT AT ARMC)
CD4 % Helper T Cell: 28 % — ABNORMAL LOW (ref 33–65)
CD4 T Cell Abs: 391 /uL — ABNORMAL LOW (ref 400–1790)

## 2022-06-21 ENCOUNTER — Telehealth: Payer: Self-pay

## 2022-06-21 LAB — LIPID PANEL
Cholesterol: 187 mg/dL (ref <200)
HDL: 56 mg/dL (ref >=40)
LDL Cholesterol (Calc): 110 mg/dL (calc) — ABNORMAL HIGH
Non-HDL Cholesterol (Calc): 131 mg/dL (calc) — ABNORMAL HIGH (ref <130)
Total CHOL/HDL Ratio: 3.3 (calc) (ref <5.0)
Triglycerides: 104 mg/dL (ref <150)

## 2022-06-21 LAB — COMPLETE METABOLIC PANEL WITH GFR
AG Ratio: 1.7 (calc) (ref 1.0–2.5)
ALT: 16 U/L (ref 9–46)
AST: 24 U/L (ref 10–40)
Albumin: 4.7 g/dL (ref 3.6–5.1)
Alkaline phosphatase (APISO): 79 U/L (ref 36–130)
BUN: 11 mg/dL (ref 7–25)
CO2: 27 mmol/L (ref 20–32)
Calcium: 9.8 mg/dL (ref 8.6–10.3)
Chloride: 103 mmol/L (ref 98–110)
Creat: 1.22 mg/dL (ref 0.60–1.29)
Globulin: 2.7 g/dL (calc) (ref 1.9–3.7)
Glucose, Bld: 74 mg/dL (ref 65–99)
Potassium: 3.8 mmol/L (ref 3.5–5.3)
Sodium: 141 mmol/L (ref 135–146)
Total Bilirubin: 0.5 mg/dL (ref 0.2–1.2)
Total Protein: 7.4 g/dL (ref 6.1–8.1)
eGFR: 76 mL/min/{1.73_m2} (ref >=60)

## 2022-06-21 LAB — CBC WITH DIFFERENTIAL/PLATELET
Absolute Monocytes: 425 cells/uL (ref 200–950)
Basophils Absolute: 59 cells/uL (ref 0–200)
Basophils Relative: 1.5 %
Eosinophils Absolute: 250 cells/uL (ref 15–500)
Eosinophils Relative: 6.4 %
HCT: 43.7 % (ref 38.5–50.0)
Hemoglobin: 14.5 g/dL (ref 13.2–17.1)
Lymphs Abs: 1478.1 cells/uL (ref 850–3900)
MCH: 29.1 pg (ref 27.0–33.0)
MCHC: 33.2 g/dL (ref 32.0–36.0)
MCV: 87.6 fL (ref 80.0–100.0)
MPV: 9.4 fL (ref 7.5–12.5)
Monocytes Relative: 10.9 %
Neutro Abs: 1689 cells/uL (ref 1500–7800)
Neutrophils Relative %: 43.3 %
Platelets: 284 10*3/uL (ref 140–400)
RBC: 4.99 10*6/uL (ref 4.20–5.80)
RDW: 13.4 % (ref 11.0–15.0)
Total Lymphocyte: 37.9 %
WBC: 3.9 10*3/uL (ref 3.8–10.8)

## 2022-06-21 LAB — RPR: RPR Ser Ql: REACTIVE — AB

## 2022-06-21 LAB — RPR TITER: RPR Titer: 1:1 {titer} — ABNORMAL HIGH

## 2022-06-21 LAB — HIV-1 RNA QUANT-NO REFLEX-BLD
HIV 1 RNA Quant: NOT DETECTED Copies/mL
HIV-1 RNA Quant, Log: NOT DETECTED Log cps/mL

## 2022-06-21 LAB — T PALLIDUM AB: T Pallidum Abs: POSITIVE — AB

## 2022-06-21 NOTE — Telephone Encounter (Signed)
Called [facility] to relay results and schedule treatment, no answer. Left HIPAA compliant voicemail requesting callback.   Rectal swab positive for chlamydia so he will need 21 days of doxycycline.   Sandie Ano, RN

## 2022-06-21 NOTE — Telephone Encounter (Signed)
-----   Message from Randall Hiss, MD sent at 06/21/2022  9:10 AM EDT ----- Regarding: GC and chlamydia  He needs 500 mg Rocephin IM and oral doxy 100 mg bid x 7 days (21 if it is rectal chlamydia ) his partners need to be treated and tested. He shoudl consider doxy pep and we should probably see him q6 months at least to check for stis ----- Message ----- From: Interface, [facility] Lab Results In Sent: 06/19/2022   3:29 PM EDT To: Randall Hiss, MD

## 2022-06-24 ENCOUNTER — Other Ambulatory Visit: Payer: Self-pay

## 2022-06-24 MED ORDER — DOXYCYCLINE HYCLATE 100 MG PO TABS: 100.0000 mg | ORAL_TABLET | Freq: Two times a day (BID) | ORAL | 0 refills | Status: AC

## 2022-06-24 MED ORDER — DOXYCYCLINE HYCLATE 100 MG PO TABS: 100.0000 mg | ORAL_TABLET | Freq: Two times a day (BID) | ORAL | 0 refills | Status: DC

## 2022-06-24 NOTE — Telephone Encounter (Addendum)
I spoke to the patient advised him of STI test. Patient verbalized understanding and would like to think on starting doxy pep. Patient also advised to let partner know so they can be tested and treated. Patient also advised no sex for 7-10 day after completion of treatment. RX sent for doxy for rectal chlamydia treatment  (21 days). [place] Rollyson T Pricilla Loveless

## 2022-06-26 ENCOUNTER — Other Ambulatory Visit: Payer: Self-pay

## 2022-06-26 ENCOUNTER — Ambulatory Visit (INDEPENDENT_AMBULATORY_CARE_PROVIDER_SITE_OTHER): Payer: 59

## 2022-06-26 DIAGNOSIS — A549 Gonococcal infection, unspecified: Secondary | ICD-10-CM

## 2022-06-26 MED ORDER — CEFTRIAXONE SODIUM 500 MG IJ SOLR
500.0000 mg | Freq: Once | INTRAMUSCULAR | Status: AC
  Administered 2022-06-26: 500 mg via INTRAMUSCULAR

## 2022-12-06 ENCOUNTER — Ambulatory Visit (INDEPENDENT_AMBULATORY_CARE_PROVIDER_SITE_OTHER): Payer: 59 | Admitting: Family Medicine

## 2022-12-06 ENCOUNTER — Encounter: Payer: Self-pay | Admitting: Family Medicine

## 2022-12-06 ENCOUNTER — Ambulatory Visit (INDEPENDENT_AMBULATORY_CARE_PROVIDER_SITE_OTHER): Payer: 59

## 2022-12-06 VITALS — BP 120/80 | HR 64 | Temp 97.6°F | Resp 12 | Ht 69.0 in | Wt 199.0 lb

## 2022-12-06 DIAGNOSIS — Z23 Encounter for immunization: Secondary | ICD-10-CM | POA: Diagnosis not present

## 2022-12-06 DIAGNOSIS — S199XXA Unspecified injury of neck, initial encounter: Secondary | ICD-10-CM

## 2022-12-06 DIAGNOSIS — E785 Hyperlipidemia, unspecified: Secondary | ICD-10-CM

## 2022-12-06 DIAGNOSIS — I10 Essential (primary) hypertension: Secondary | ICD-10-CM

## 2022-12-06 DIAGNOSIS — R200 Anesthesia of skin: Secondary | ICD-10-CM

## 2022-12-06 LAB — TSH: TSH: 1.87 u[IU]/mL (ref 0.35–5.50)

## 2022-12-06 LAB — VITAMIN B12: Vitamin B-12: 240 pg/mL (ref 211–911)

## 2022-12-06 MED ORDER — PREDNISONE 20 MG PO TABS
ORAL_TABLET | ORAL | 0 refills | Status: AC
Start: 2022-12-06 — End: 2022-12-14

## 2022-12-06 NOTE — Assessment & Plan Note (Signed)
Currently on atorvastatin 20 mg daily. Last LDL 110 in 06/2022. This problem is being followed by ID.

## 2022-12-06 NOTE — Patient Instructions (Addendum)
A few things to remember from today's visit:  Hyperlipidemia, unspecified hyperlipidemia type  Primary hypertension  Right upper extremity numbness - Plan: Vitamin B12, TSH, predniSONE (DELTASONE) 20 MG tablet  Neck injury, initial encounter - Plan: DG Cervical Spine Complete  Take prednisone with breakfast. If numbness is not resolved in 4-6 week, we will need to consider a cervical MRI and/or sport med refer.  If you need refills for medications you take chronically, please call your pharmacy. Do not use My Chart to request refills or for acute issues that need immediate attention. If you send a my chart message, it may take a few days to be addressed, specially if I am not in the office.  Please be sure medication list is accurate. If a new problem present, please set up appointment sooner than planned today.

## 2022-12-06 NOTE — Assessment & Plan Note (Signed)
BP adequately controlled. Currently on amlodipine 5 mg daily. Reporting BP's at home 120's/70's. Following with ID.

## 2022-12-06 NOTE — Progress Notes (Signed)
ACUTE VISIT Chief Complaint  Patient presents with   Fall    Had a fall 3-4 weeks ago, tingling in the right arm started about a week ago. Pt stated it feels like it travels up the arm.    HPI: Lance Craig is a 42 y.o. male with a PMHx significant for HTN, HLD, HIV, and anxiety, who is here today complaining of paresthesia in the right arm after a recent fall as described above.  He was last seen on 09/19/2021. Since his last visit he has seen ID.   Fall/paresthesia:  [place] about 3-4 weeks ago. He broke his fall with his arm. He got up immediately and had no problems walking.  He reports some soreness in his right-sided neck about 2-3 days later, which dissipated after he did some exercises at home.  Last week, he began experiencing intermittent numbness and tingling in his right arm. He states it is mostly in the upper arm but occasionally in his forearm as well.  He reports the sensation lasts for a few seconds, and occurs when he bends over or leans against his arm. It is relieved by standing up or straightening out his arm.  He denies any neck pain.  He has taken no OTC medications.   Hypertension: He is checking his BP regularly at home. They have regularly been in the 120s/70's.  He is taking amlodipine 5 mg daily.  Lab Results  Component Value Date   NA 141 06/19/2022   CL 103 06/19/2022   K 3.8 06/19/2022   CO2 27 06/19/2022   BUN 11 06/19/2022   CREATININE 1.22 06/19/2022   EGFR 76 06/19/2022   CALCIUM 9.8 06/19/2022   PHOS 3.5 11/24/2013   ALBUMIN 4.3 07/24/2016   GLUCOSE 74 06/19/2022   Hyperlipidemia: He is taking atorvastatin 20 mg daily.  Lab Results  Component Value Date   CHOL 187 06/19/2022   HDL 56 06/19/2022   LDLCALC 110 (H) 06/19/2022   TRIG 104 06/19/2022   CHOLHDL 3.3 06/19/2022   Review of Systems  Constitutional:  Negative for activity change, appetite change, chills and fever.  Respiratory:  Negative for  cough and wheezing.   Cardiovascular:  Negative for chest pain and palpitations.  Gastrointestinal:  Negative for abdominal pain, nausea and vomiting.  Genitourinary:  Negative for decreased urine volume and hematuria.  Musculoskeletal:  Negative for gait problem and joint swelling.  Skin:  Negative for rash.  Neurological:  Negative for syncope and weakness.  See other pertinent positives and negatives in HPI.  Current Outpatient Medications on File Prior to Visit  Medication Sig Dispense Refill   amLODipine (NORVASC) 5 MG tablet Take 1 tablet (5 mg total) by mouth daily. 30 tablet 11   atorvastatin (LIPITOR) 20 MG tablet Take 1 tablet (20 mg total) by mouth daily. 30 tablet 11   dolutegravir-lamiVUDine (DOVATO) 50-300 MG tablet Take 1 tablet by mouth daily. 30 tablet 11   hydrocortisone (ANUSOL-HC) 25 MG suppository Place 1 suppository (25 mg total) rectally 2 (two) times daily. 12 suppository 0   No current facility-administered medications on file prior to visit.   Past Medical History:  Diagnosis Date   Acute renal insufficiency 02/10/2017   Anal wart 08/07/2016   Anemia    Anxiety    Contusion of bone 01/18/2020   Deep venous thrombosis (HCC)    Depression    External hemorrhoid 08/07/2016   Generalized anxiety disorder 08/10/2014  HIV disease (HCC) 08/13/2017   Hyperlipemia 06/19/2022   Hypertension 08/22/2020   Internal hemorrhoid 08/07/2016   Osteoarthritis 01/02/2021   Panic attack 08/10/2014   PCP (pneumocystis jiroveci pneumonia) (HCC)    Syphilis 09/20/2015   Vaccine counseling 01/02/2021   No Known Allergies  Social History   Socioeconomic History   Marital status: Single    Spouse name: Not on file   Number of children: Not on file   Years of education: Not on file   Highest education level: Not on file  Occupational History   Not on file  Tobacco Use   Smoking status: Never   Smokeless tobacco: Never  Vaping Use   Vaping status: Never Used   Substance and Sexual Activity   Alcohol use: Yes    Alcohol/week: 1.0 standard drink of alcohol    Types: 1 Standard drinks or equivalent per week    Comment: socially   Drug use: Yes    Types: Marijuana    Comment: smokes socially   Sexual activity: Not Currently    Partners: Male    Comment: declined condoms  Other Topics Concern   Not on file  Social History Narrative   Not on file   Social Determinants of Health   Financial Resource Strain: Not on file  Food Insecurity: Not on file  Transportation Needs: Not on file  Physical Activity: Not on file  Stress: Not on file  Social Connections: Not on file    Vitals:   12/06/22 0831  BP: 120/80  Pulse: 64  Resp: 12  Temp: 97.6 F (36.4 C)  SpO2: 99%   Body mass index is 29.39 kg/m.  Physical Exam Vitals and nursing note reviewed.  Constitutional:      General: He is not in acute distress.    Appearance: He is well-developed.  HENT:     Head: Normocephalic and atraumatic.  Eyes:     Conjunctiva/sclera: Conjunctivae normal.  Cardiovascular:     Rate and Rhythm: Normal rate and regular rhythm.     Pulses:          Radial pulses are 2+ on the right side and 2+ on the left side.     Heart sounds: No murmur heard. Pulmonary:     Effort: Pulmonary effort is normal. No respiratory distress.     Breath sounds: Normal breath sounds.  Abdominal:     Palpations: Abdomen is soft. There is no mass.     Tenderness: There is no abdominal tenderness.  Musculoskeletal:     Cervical back: No tenderness or bony tenderness. No pain with movement. Decreased range of motion (mild).  Lymphadenopathy:     Cervical: No cervical adenopathy.  Skin:    General: Skin is warm.     Findings: No erythema or rash.  Neurological:     Mental Status: He is alert and oriented to person, place, and time.     Cranial Nerves: No cranial nerve deficit.     Gait: Gait normal.  Psychiatric:        Mood and Affect: Affect normal. Mood is  anxious.   ASSESSMENT AND PLAN:  Lance Craig was seen today for paresthesia in the right arm after a recent fall.  Lab Results  Component Value Date   VITAMINB12 240 12/06/2022   Lab Results  Component Value Date   TSH 1.87 12/06/2022   Neck injury, initial encounter During fall about 3 to 4 weeks ago, had right-sided neck pain, which has  resolved.  Because right upper extremity numbness, he would like cervical imaging today.  -     DG Cervical Spine Complete; Future  Right upper extremity numbness I am not sure if this problem is related to neck injury during fall. We discussed possible etiologies,?  Radicular symptoms. He has reported episodes of numbness and tingling sensation in the past but reports this 1 is specifically as new. After discussion of some side effects, he agrees with trying a prednisone taper. Further recommendation will be given according to lab results. If problem is persistent in 4 to 6 weeks, cervical MRI and/or Ortho/sports medicine consultation will be considered. Monitor for new symptoms.  -     Vitamin B12; Future -     TSH; Future -     predniSONE; 2 tabs for 5 days, 1 tabs for 3 days, and 1/2 tab for 3 days. Take tables together with breakfast.  Dispense: 15 tablet; Refill: 0  Hyperlipidemia, unspecified hyperlipidemia type Assessment & Plan: Currently on atorvastatin 20 mg daily. Last LDL 110 in 06/2022. This problem is being followed by ID.  Primary hypertension Assessment & Plan: BP adequately controlled. Currently on amlodipine 5 mg daily. Reporting BP's at home 120's/70's. Following with ID.  Need for influenza vaccination -     Flu vaccine trivalent PF, 6mos and older(Flulaval,Afluria,Fluarix,Fluzone)   Return if symptoms worsen or fail to improve, for keep next appointment.  I, Rolla Etienne Wierda, acting as a scribe for Jaiana Sheffer [place], MD., have documented all relevant documentation on the behalf of Azelie Noguera [place], MD, as directed by   Sharan Mcenaney [place], MD while in the presence of Berkley Wrightsman [place], MD.   I, Danel Requena [place], MD, have reviewed all documentation for this visit. The documentation on 12/06/22 for the exam, diagnosis, procedures, and orders are all accurate and complete.  Renesmay Nesbitt G. [place], MD  [facility]. [place] office.

## 2022-12-16 ENCOUNTER — Encounter: Payer: Self-pay | Admitting: Infectious Disease

## 2022-12-16 ENCOUNTER — Other Ambulatory Visit: Payer: Self-pay

## 2022-12-16 ENCOUNTER — Other Ambulatory Visit (HOSPITAL_COMMUNITY)
Admission: RE | Admit: 2022-12-16 | Discharge: 2022-12-16 | Disposition: A | Discharge status: Home / Self Care | Payer: 59 | Source: Ambulatory Visit | Attending: Infectious Disease | Admitting: Infectious Disease

## 2022-12-16 ENCOUNTER — Ambulatory Visit (INDEPENDENT_AMBULATORY_CARE_PROVIDER_SITE_OTHER): Payer: 59 | Admitting: Infectious Disease

## 2022-12-16 VITALS — BP 141/91 | HR 73 | Temp 97.5°F | Ht 69.0 in | Wt 197.0 lb

## 2022-12-16 DIAGNOSIS — B20 Human immunodeficiency virus [HIV] disease: Secondary | ICD-10-CM | POA: Insufficient documentation

## 2022-12-16 DIAGNOSIS — E785 Hyperlipidemia, unspecified: Secondary | ICD-10-CM | POA: Diagnosis not present

## 2022-12-16 DIAGNOSIS — Z113 Encounter for screening for infections with a predominantly sexual mode of transmission: Secondary | ICD-10-CM

## 2022-12-16 DIAGNOSIS — Z7185 Encounter for immunization safety counseling: Secondary | ICD-10-CM | POA: Insufficient documentation

## 2022-12-16 DIAGNOSIS — I10 Essential (primary) hypertension: Secondary | ICD-10-CM | POA: Diagnosis not present

## 2022-12-16 MED ORDER — ATORVASTATIN CALCIUM 20 MG PO TABS: 20.0000 mg | ORAL_TABLET | Freq: Every day | ORAL | 11 refills | Status: DC

## 2022-12-16 MED ORDER — DOVATO 50-300 MG PO TABS: 1.0000 | ORAL_TABLET | Freq: Every day | ORAL | 11 refills | Status: DC

## 2022-12-16 MED ORDER — AMLODIPINE BESYLATE 5 MG PO TABS: 5.0000 mg | ORAL_TABLET | Freq: Every day | ORAL | 11 refills | Status: DC

## 2022-12-16 MED ORDER — DOXYCYCLINE HYCLATE 100 MG PO TABS: ORAL_TABLET | ORAL | 3 refills | Status: DC

## 2022-12-16 NOTE — Progress Notes (Signed)
Chief complaint: Follow-up for HIV disease also with some paresthesias in his arms f  Patient ID: Lance Craig, male    DOB: 1980-08-31, 42 y.o.   MRN: [id number]  HPI  Lance Craig is a 42 y.o. male with history of HIV/AIDS w initially treated with Atripla eventually changed over to Triumeq which she is maintained perfect virological suppression.  He does  not have hepatitis B coinfection.    I was able to convince him to switch to Dovato and he has maintained perfect suppression.   We had tried to get him in with [facility] for HRA but I went ahead and did an anal Pap smear with him today and if it is abnormal I will forward a referral to them now that he has insurance.  He had a fall recently has been suffering from some neck pain but now more so with some paresthesias and he is seen his primary care physician Lance Craig who obtained plain films did not did not show pathology she was going to prescribe him prednisone but he is concerned about drug-drug interactions that should not be an issue with his current medications.       Past Medical History:  Diagnosis Date   Acute renal insufficiency 02/10/2017   Anal wart 08/07/2016   Anemia    Anxiety    Contusion of bone 01/18/2020   Deep venous thrombosis (HCC)    Depression    External hemorrhoid 08/07/2016   Generalized anxiety disorder 08/10/2014   HIV disease (HCC) 08/13/2017   Hyperlipemia 06/19/2022   Hypertension 08/22/2020   Internal hemorrhoid 08/07/2016   Osteoarthritis 01/02/2021   Panic attack 08/10/2014   PCP (pneumocystis jiroveci pneumonia) (HCC)    Syphilis 09/20/2015   Vaccine counseling 01/02/2021    No past surgical history on file.  Family History  Problem Relation Age of Onset   Kidney disease Maternal Grandmother    Cancer Neg Hx    Diabetes Neg Hx       Social History   Socioeconomic History   Marital status: Single    Spouse name: Not on file   Number of children: Not on  file   Years of education: Not on file   Highest education level: Not on file  Occupational History   Not on file  Tobacco Use   Smoking status: Never   Smokeless tobacco: Never  Vaping Use   Vaping status: Never Used  Substance and Sexual Activity   Alcohol use: Yes    Alcohol/week: 1.0 standard drink of alcohol    Types: 1 Standard drinks or equivalent per week    Comment: socially   Drug use: Yes    Types: Marijuana    Comment: smokes socially   Sexual activity: Not Currently    Partners: Male    Comment: declined condoms  Other Topics Concern   Not on file  Social History Narrative   Not on file   Social Determinants of Health   Financial Resource Strain: Not on file  Food Insecurity: Not on file  Transportation Needs: Not on file  Physical Activity: Not on file  Stress: Not on file  Social Connections: Not on file    No Known Allergies    Current Outpatient Medications:    amLODipine (NORVASC) 5 MG tablet, Take 1 tablet (5 mg total) by mouth daily., Disp: 30 tablet, Rfl: 11   atorvastatin (LIPITOR) 20 MG tablet, Take 1 tablet (20 mg total) by mouth daily.,  Disp: 30 tablet, Rfl: 11   dolutegravir-lamiVUDine (DOVATO) 50-300 MG tablet, Take 1 tablet by mouth daily., Disp: 30 tablet, Rfl: 11   hydrocortisone (ANUSOL-HC) 25 MG suppository, Place 1 suppository (25 mg total) rectally 2 (two) times daily. (Patient not taking: Reported on 12/16/2022), Disp: 12 suppository, Rfl: 0          Review of Systems  Constitutional:  Negative for activity change, appetite change, chills, diaphoresis, fatigue, fever and unexpected weight change.  HENT:  Negative for congestion, rhinorrhea, sinus pressure, sneezing, sore throat and trouble swallowing.   Eyes:  Negative for photophobia and visual disturbance.  Respiratory:  Negative for cough, chest tightness, shortness of breath, wheezing and stridor.   Cardiovascular:  Negative for chest pain, palpitations and leg swelling.   Gastrointestinal:  Negative for abdominal distention, abdominal pain, anal bleeding, blood in stool, constipation, diarrhea, nausea and vomiting.  Genitourinary:  Negative for difficulty urinating, dysuria, flank pain and hematuria.  Musculoskeletal:  Negative for arthralgias, back pain, gait problem, joint swelling and myalgias.  Skin:  Negative for color change, pallor, rash and wound.  Neurological:  Positive for numbness. Negative for dizziness, tremors, weakness and light-headedness.  Hematological:  Negative for adenopathy. Does not bruise/bleed easily.  Psychiatric/Behavioral:  Negative for agitation, behavioral problems, confusion, decreased concentration, dysphoric mood and sleep disturbance.        Objective:   Physical Exam Constitutional:      Appearance: He is well-developed.  HENT:     Head: Normocephalic and atraumatic.  Eyes:     Conjunctiva/sclera: Conjunctivae normal.  Cardiovascular:     Rate and Rhythm: Normal rate and regular rhythm.  Pulmonary:     Effort: Pulmonary effort is normal. No respiratory distress.     Breath sounds: No wheezing.  Abdominal:     General: There is no distension.     Palpations: Abdomen is soft.  Musculoskeletal:        General: No tenderness. Normal range of motion.     Cervical back: Normal range of motion and neck supple.  Skin:    General: Skin is warm and dry.     Coloration: Skin is not pale.     Findings: No erythema or rash.  Neurological:     General: No focal deficit present.     Mental Status: He is alert and oriented to person, place, and time.  Psychiatric:        Mood and Affect: Mood normal.        Behavior: Behavior normal.        Thought Content: Thought content normal.        Judgment: Judgment normal.           Assessment & Plan:   HIV disease:  I will add order HIV viral load CD4 count CBC with differential CMP, RPR GC and chlamydia and I will continue  [facility]  Dovato  prescription  Hyperlipidemia will continue on his atorvastatin  Hypertension new continue his amlodipine  Neck pain and paresthesias plain films unremarkable okay for him to take prednisone certainly if persists would pursue more aggressive imaging.  STI screen will check syphilis as well as gonorrhea chlamydia and oropharynx rectum and urine.  He was interested in doxycycline for postexposure prophylaxis I have sent in prescription to his pharmacy  Anal cancer prevention obtain anal Pap smear today  Vaccine counseling recommended updated COVID and flu vaccinations   Vaccine counseling recommended updated COVID-19 shot which she refused and  recommended tetanus shot which he received

## 2022-12-16 NOTE — Addendum Note (Signed)
Addended by: Harley Alto on: 12/16/2022 11:09 AM   Modules accepted: Orders

## 2022-12-17 LAB — CYTOLOGY, (ORAL, ANAL, URETHRAL) ANCILLARY ONLY
Chlamydia: NEGATIVE
Chlamydia: POSITIVE — AB
Comment: NEGATIVE
Comment: NORMAL
Comment: NEGATIVE
Comment: NORMAL
Neisseria Gonorrhea: NEGATIVE
Neisseria Gonorrhea: POSITIVE — AB

## 2022-12-17 LAB — URINE CYTOLOGY ANCILLARY ONLY
Chlamydia: NEGATIVE
Comment: NEGATIVE
Comment: NORMAL
Neisseria Gonorrhea: NEGATIVE

## 2022-12-18 ENCOUNTER — Telehealth: Payer: Self-pay

## 2022-12-18 DIAGNOSIS — A749 Chlamydial infection, unspecified: Secondary | ICD-10-CM

## 2022-12-18 LAB — COMPLETE METABOLIC PANEL WITH GFR
AG Ratio: 1.5 (calc) (ref 1.0–2.5)
ALT: 18 U/L (ref 9–46)
AST: 20 U/L (ref 10–40)
Albumin: 4.6 g/dL (ref 3.6–5.1)
Alkaline phosphatase (APISO): 94 U/L (ref 36–130)
BUN/Creatinine Ratio: 10 (calc) (ref 6–22)
BUN: 13 mg/dL (ref 7–25)
CO2: 27 mmol/L (ref 20–32)
Calcium: 9.6 mg/dL (ref 8.6–10.3)
Chloride: 103 mmol/L (ref 98–110)
Creat: 1.34 mg/dL — ABNORMAL HIGH (ref 0.60–1.29)
Globulin: 3 g/dL (ref 1.9–3.7)
Glucose, Bld: 80 mg/dL (ref 65–99)
Potassium: 4.1 mmol/L (ref 3.5–5.3)
Sodium: 138 mmol/L (ref 135–146)
Total Bilirubin: 0.3 mg/dL (ref 0.2–1.2)
Total Protein: 7.6 g/dL (ref 6.1–8.1)
eGFR: 68 mL/min/{1.73_m2} (ref >=60)

## 2022-12-18 LAB — T PALLIDUM AB: T Pallidum Abs: POSITIVE — AB

## 2022-12-18 LAB — CBC WITH DIFFERENTIAL/PLATELET
Absolute Monocytes: 495 {cells}/uL (ref 200–950)
Basophils Absolute: 78 {cells}/uL (ref 0–200)
Basophils Relative: 2 %
Eosinophils Absolute: 179 {cells}/uL (ref 15–500)
Eosinophils Relative: 4.6 %
HCT: 42.5 % (ref 38.5–50.0)
Hemoglobin: 14 g/dL (ref 13.2–17.1)
Lymphs Abs: 1564 {cells}/uL (ref 850–3900)
MCH: 29.8 pg (ref 27.0–33.0)
MCHC: 32.9 g/dL (ref 32.0–36.0)
MCV: 90.4 fL (ref 80.0–100.0)
MPV: 9.4 fL (ref 7.5–12.5)
Monocytes Relative: 12.7 %
Neutro Abs: 1583 {cells}/uL (ref 1500–7800)
Neutrophils Relative %: 40.6 %
Platelets: 287 10*3/uL (ref 140–400)
RBC: 4.7 10*6/uL (ref 4.20–5.80)
RDW: 13.5 % (ref 11.0–15.0)
Total Lymphocyte: 40.1 %
WBC: 3.9 10*3/uL (ref 3.8–10.8)

## 2022-12-18 LAB — LIPID PANEL
Cholesterol: 192 mg/dL (ref <200)
HDL: 63 mg/dL (ref >=40)
LDL Cholesterol (Calc): 112 mg/dL — ABNORMAL HIGH
Non-HDL Cholesterol (Calc): 129 mg/dL (ref <130)
Total CHOL/HDL Ratio: 3 (calc) (ref <5.0)
Triglycerides: 76 mg/dL (ref <150)

## 2022-12-18 LAB — HIV-1 RNA QUANT-NO REFLEX-BLD
HIV 1 RNA Quant: 36 {copies}/mL — ABNORMAL HIGH
HIV-1 RNA Quant, Log: 1.56 {Log} — ABNORMAL HIGH

## 2022-12-18 LAB — RPR: RPR Ser Ql: REACTIVE — AB

## 2022-12-18 LAB — T-HELPER CELLS (CD4) COUNT (NOT AT ARMC)
CD4 % Helper T Cell: 29 % — ABNORMAL LOW (ref 33–65)
CD4 T Cell Abs: 419 /uL (ref 400–1790)

## 2022-12-18 LAB — RPR TITER: RPR Titer: 1:2 {titer} — ABNORMAL HIGH

## 2022-12-18 NOTE — Telephone Encounter (Signed)
-----   Message from [place] sent at 12/17/2022  8:46 PM EDT ----- Regarding: FW: He needs 500 mg IM rocephin for GC and doxycycline 100 mg bid x 7 days ----- Message ----- From: Janace Hoard Lab Results In Sent: 12/16/2022   3:20 PM EDT To: Randall Hiss, MD

## 2022-12-18 NOTE — Telephone Encounter (Signed)
Called patient to relay results and schedule treatment, no answer and voicemail full.   Sandie Ano, RN

## 2022-12-19 MED ORDER — DOXYCYCLINE HYCLATE 100 MG PO TABS
100.0000 mg | ORAL_TABLET | Freq: Two times a day (BID) | ORAL | 0 refills | Status: DC
Start: 2022-12-19 — End: 2023-08-13

## 2022-12-19 NOTE — Telephone Encounter (Signed)
[place] returned call, relayed positive gonorrhea and chlamydia results. Confirmed no allergies. He will come in tomorrow for ceftriaxone injection. Doxy sent to [facility].   Advised patient no sex until treatment is completed plus an additional 7 days and instructed to notify sexual partners for testing and treatment. Patient verbalized understanding and has no further questions.   Sandie Ano, RN

## 2022-12-19 NOTE — Addendum Note (Signed)
Addended by: Linna Hoff D on: 12/19/2022 09:52 AM   Modules accepted: Orders

## 2022-12-19 NOTE — Telephone Encounter (Signed)
Second attempt to reach Lance Craig, no answer and unable to leave voicemail.   Sandie Ano, RN

## 2022-12-20 ENCOUNTER — Ambulatory Visit (INDEPENDENT_AMBULATORY_CARE_PROVIDER_SITE_OTHER): Payer: 59

## 2022-12-20 ENCOUNTER — Other Ambulatory Visit: Payer: Self-pay

## 2022-12-20 ENCOUNTER — Other Ambulatory Visit (HOSPITAL_COMMUNITY): Payer: Self-pay

## 2022-12-20 DIAGNOSIS — A549 Gonococcal infection, unspecified: Secondary | ICD-10-CM | POA: Diagnosis not present

## 2022-12-20 MED ORDER — DOVATO 50-300 MG PO TABS
1.0000 | ORAL_TABLET | Freq: Every day | ORAL | 11 refills | Status: DC
  Filled 2022-12-20: qty 30, 30d supply, fill #0
  Filled 2023-01-28: qty 30, 30d supply, fill #1
  Filled 2023-03-05: qty 30, 30d supply, fill #2
  Filled 2023-03-26: qty 30, 30d supply, fill #3
  Filled 2023-05-14: qty 30, 30d supply, fill #4
  Filled 2023-06-18: qty 30, 30d supply, fill #5
  Filled 2023-07-18: qty 30, 30d supply, fill #6

## 2022-12-20 MED ORDER — CEFTRIAXONE SODIUM 500 MG IJ SOLR
500.0000 mg | Freq: Once | INTRAMUSCULAR | Status: AC
Start: 2022-12-20 — End: 2022-12-20
  Administered 2022-12-20: 500 mg via INTRAMUSCULAR

## 2022-12-20 NOTE — Progress Notes (Signed)
Specialty Pharmacy Initial Fill Coordination Note  Lance Craig is a 42 y.o. male contacted today regarding refills of specialty medication(s) Dolutegravir-Lamivudine   Patient requested [name] at [facility] at [place] date: 12/20/22   Medication will be filled on 12/20/22.   Patient is aware of $0 copayment.

## 2022-12-20 NOTE — Progress Notes (Signed)
Patient in office today for gonorrhea treatment (500 mg ceftriaxone x 1). Patient denies no medication allergies Advised to inform partner so they can be tested and treated.  Patient declined condoms.  Patient stated he will pick up doxy for chlamydia treatment today. Saumya Hukill Jonathon Resides, CMA

## 2022-12-23 ENCOUNTER — Other Ambulatory Visit: Payer: Self-pay | Admitting: Pharmacist

## 2022-12-23 ENCOUNTER — Telehealth: Payer: Self-pay

## 2022-12-23 LAB — CYTOLOGY - PAP
Comment: NEGATIVE
Diagnosis: HIGH — AB
High risk HPV: POSITIVE — AB

## 2022-12-23 NOTE — Progress Notes (Signed)
Specialty Pharmacy Initiation Note   Lance Craig is a 42 y.o. male who will be followed by the specialty pharmacy service for RxSp HIV    Review of administration, indication, effectiveness, safety, potential side effects, storage/disposable, and missed dose instructions occurred today for patient's specialty medication(s) Dolutegravir-Lamivudine     Patient did not have any additional questions or concerns.   Patient's therapy is appropriate to: Initiate    Goals Addressed             This Visit's Progress    Achieve Undetectable HIV Viral Load < 20       Patient is initiating therapy. Patient will work on increased adherence.      Comply with lab assessments       Patient is on track. Patient will adhere to provider and/or lab appointments.      Improve or maintain quality of life       Patient is on track. Patient will be evaluated at upcoming provider appointment to assess progress.        Lance Craig, PharmD, BCIDP, AAHIVP, CPP Clinical Pharmacist Practitioner Infectious Diseases Clinical Pharmacist [facility] 12/23/2022, 10:05 AM

## 2022-12-23 NOTE — Telephone Encounter (Signed)
Left voicemail with patient to call office back. Will need to review ANAL pap results. Was previously referred to [facility] in [place]. Will provide patient office contact info.  Juanita Laster, RMA

## 2022-12-23 NOTE — Telephone Encounter (Signed)
-----   Message from [place] sent at 12/23/2022  8:57 AM EDT ----- Regarding: FW: He needs rocephin 500 mg I'm doxy 100 bid x. 7 days and referral to [facility] and [facility] for HRA based on pap or of not insured to Dr Maurice March at [facility] ----- Message ----- From: Leory Plowman, [facility] Lab Results In Sent: 12/16/2022   3:20 PM EDT To: Randall Hiss, MD

## 2022-12-25 ENCOUNTER — Other Ambulatory Visit: Payer: Self-pay | Admitting: Pharmacist

## 2022-12-25 DIAGNOSIS — B20 Human immunodeficiency virus [HIV] disease: Secondary | ICD-10-CM

## 2022-12-25 MED ORDER — DOVATO 50-300 MG PO TABS
1.0000 | ORAL_TABLET | Freq: Every day | ORAL | Status: AC
Start: 2022-12-20 — End: 2023-01-03

## 2022-12-25 NOTE — Progress Notes (Signed)
Medication Samples have been provided to the patient.  Drug name: Dovato        Strength: 50/300 mg         Qty: 14  Tablets (1 bottles) LOT: [id number]   Exp.Date: 11/25  Dosing instructions: Take one tablet by mouth once daily  The patient has been instructed regarding the correct time, dose, and frequency of taking this medication, including desired effects and most common side effects.   Margarite Gouge, PharmD, CPP, BCIDP, AAHIVP Clinical Pharmacist Practitioner Infectious Diseases Clinical Pharmacist [facility]

## 2022-12-28 ENCOUNTER — Other Ambulatory Visit (HOSPITAL_COMMUNITY): Payer: Self-pay

## 2023-01-08 ENCOUNTER — Other Ambulatory Visit: Payer: Self-pay

## 2023-01-15 ENCOUNTER — Other Ambulatory Visit: Payer: Self-pay

## 2023-01-15 ENCOUNTER — Telehealth: Payer: Self-pay

## 2023-01-15 NOTE — Telephone Encounter (Signed)
Patient needs referral for HRA due to anal pap results. CMA tried to get in touch with him 10/7 to discuss, will send MyChart message.    Sandie Ano, RN

## 2023-01-16 ENCOUNTER — Other Ambulatory Visit (HOSPITAL_COMMUNITY): Payer: Self-pay

## 2023-01-24 ENCOUNTER — Other Ambulatory Visit: Payer: Self-pay

## 2023-01-28 ENCOUNTER — Other Ambulatory Visit (HOSPITAL_COMMUNITY): Payer: Self-pay

## 2023-01-28 ENCOUNTER — Other Ambulatory Visit (HOSPITAL_COMMUNITY): Payer: Self-pay | Admitting: Pharmacy Technician

## 2023-01-28 NOTE — Progress Notes (Signed)
Specialty Pharmacy Refill Coordination Note  Lance Craig is a 42 y.o. male contacted today regarding refills of specialty medication(s) Dolutegravir-Lamivudine   Patient requested [name] at [facility] at [place] date: 02/01/23   Medication will be filled on 01/31/23.

## 2023-01-31 ENCOUNTER — Other Ambulatory Visit: Payer: Self-pay

## 2023-02-03 ENCOUNTER — Other Ambulatory Visit (HOSPITAL_COMMUNITY): Payer: Self-pay

## 2023-02-11 ENCOUNTER — Other Ambulatory Visit (HOSPITAL_COMMUNITY): Payer: Self-pay

## 2023-02-19 ENCOUNTER — Other Ambulatory Visit: Payer: Self-pay

## 2023-02-20 ENCOUNTER — Other Ambulatory Visit: Payer: Self-pay

## 2023-02-24 ENCOUNTER — Other Ambulatory Visit (HOSPITAL_COMMUNITY): Payer: Self-pay

## 2023-02-24 ENCOUNTER — Encounter (HOSPITAL_COMMUNITY): Payer: Self-pay

## 2023-02-26 ENCOUNTER — Other Ambulatory Visit (HOSPITAL_COMMUNITY): Payer: Self-pay

## 2023-03-05 ENCOUNTER — Other Ambulatory Visit: Payer: Self-pay

## 2023-03-05 NOTE — Progress Notes (Signed)
Specialty Pharmacy Refill Coordination Note  Lance Craig is a 42 y.o. male contacted today regarding refills of specialty medication(s) Dolutegravir-lamiVUDine (Dovato)   Patient requested (Patient-Rptd) Pickup at [facility] at [facility] date: (Patient-Rptd) 03/08/23   Medication will be filled on 12.20.24.

## 2023-03-07 ENCOUNTER — Other Ambulatory Visit: Payer: Self-pay

## 2023-03-26 ENCOUNTER — Other Ambulatory Visit: Payer: Self-pay

## 2023-03-26 NOTE — Progress Notes (Signed)
 Specialty Pharmacy Refill Coordination Note  Lance Craig is a 43 y.o. male contacted today regarding refills of specialty medication(s) Dolutegravir -lamiVUDine  (Dovato )   Patient requested [name] at [facility] at [place] date: 04/02/23   Medication will be filled on 04/01/23.

## 2023-03-26 NOTE — Progress Notes (Signed)
 Specialty Pharmacy Ongoing Clinical Assessment Note  Lance Craig is a 43 y.o. male who is being followed by the specialty pharmacy service for RxSp HIV   Patient's specialty medication(s) reviewed today: Dolutegravir -lamiVUDine  (Dovato )   Missed doses in the last 4 weeks: 1   Patient/Caregiver did not have any additional questions or concerns.   Therapeutic benefit summary: Unable to assess   Adverse events/side effects summary: No adverse events/side effects   Patient's therapy is appropriate to: Continue    Goals Addressed             This Visit's Progress    Achieve Undetectable HIV Viral Load < 20       Patient is unable to be assessed as therapy was recently initiated. Patient will maintain adherence       Comply with lab assessments   On track    Patient is on track. Patient will adhere to provider and/or lab appointments.      Improve or maintain quality of life   On track    Patient is on track. Patient will be evaluated at upcoming provider appointment to assess progress.         Follow up:  3 months  Aqueelah Cotrell M Delphina Schum Specialty Pharmacist

## 2023-04-21 ENCOUNTER — Other Ambulatory Visit (HOSPITAL_COMMUNITY): Payer: Self-pay

## 2023-04-30 ENCOUNTER — Other Ambulatory Visit: Payer: Self-pay

## 2023-05-05 ENCOUNTER — Other Ambulatory Visit: Payer: Self-pay

## 2023-05-14 ENCOUNTER — Other Ambulatory Visit: Payer: Self-pay

## 2023-05-14 ENCOUNTER — Other Ambulatory Visit: Payer: Self-pay | Admitting: Pharmacy Technician

## 2023-05-14 NOTE — Progress Notes (Signed)
 Specialty Pharmacy Refill Coordination Note  Lance Craig is a 43 y.o. male contacted today regarding refills of specialty medication(s) Dolutegravir-lamiVUDine (Dovato)   Patient requested (Patient-Rptd) Pickup at [facility] at [facility] date: (Patient-Rptd) 05/17/23   Medication will be filled on 05/16/23.

## 2023-05-16 ENCOUNTER — Other Ambulatory Visit: Payer: Self-pay

## 2023-06-03 ENCOUNTER — Other Ambulatory Visit: Payer: Self-pay

## 2023-06-06 ENCOUNTER — Other Ambulatory Visit: Payer: Self-pay

## 2023-06-09 ENCOUNTER — Other Ambulatory Visit: Payer: Self-pay

## 2023-06-12 ENCOUNTER — Other Ambulatory Visit: Payer: Self-pay

## 2023-06-16 NOTE — Progress Notes (Deleted)
   Subjective:    Patient ID: Lance Craig, male    DOB: 1980/09/27, 43 y.o.   MRN: [id number]  HPI   Past Medical History:  Diagnosis Date   Acute renal insufficiency 02/10/2017   Anal wart 08/07/2016   Anemia    Anxiety    Contusion of bone 01/18/2020   Deep venous thrombosis (HCC)    Depression    External hemorrhoid 08/07/2016   Generalized anxiety disorder 08/10/2014   HIV disease (HCC) 08/13/2017   Hyperlipemia 06/19/2022   Hypertension 08/22/2020   Internal hemorrhoid 08/07/2016   Osteoarthritis 01/02/2021   Panic attack 08/10/2014   PCP (pneumocystis jiroveci pneumonia) (HCC)    Syphilis 09/20/2015   Vaccine counseling 01/02/2021    No past surgical history on file.  Family History  Problem Relation Age of Onset   Kidney disease Maternal Grandmother    Cancer Neg Hx    Diabetes Neg Hx       Social History   Socioeconomic History   Marital status: Single    Spouse name: Not on file   Number of children: Not on file   Years of education: Not on file   Highest education level: Not on file  Occupational History   Not on file  Tobacco Use   Smoking status: Never   Smokeless tobacco: Never  Vaping Use   Vaping status: Never Used  Substance and Sexual Activity   Alcohol use: Yes    Alcohol/week: 1.0 standard drink of alcohol    Types: 1 Standard drinks or equivalent per week    Comment: socially   Drug use: Yes    Types: Marijuana    Comment: smokes socially   Sexual activity: Not Currently    Partners: Male    Comment: declined condoms  Other Topics Concern   Not on file  Social History Narrative   Not on file   Social Drivers of Health   Financial Resource Strain: Not on file  Food Insecurity: Not on file  Transportation Needs: Not on file  Physical Activity: Not on file  Stress: Not on file  Social Connections: Not on file    No Known Allergies   Current Outpatient Medications:    amLODipine (NORVASC) 5 MG tablet, Take 1  tablet (5 mg total) by mouth daily., Disp: 30 tablet, Rfl: 11   atorvastatin (LIPITOR) 20 MG tablet, Take 1 tablet (20 mg total) by mouth daily., Disp: 30 tablet, Rfl: 11   dolutegravir-lamiVUDine (DOVATO) 50-300 MG tablet, Take 1 tablet by mouth daily., Disp: 30 tablet, Rfl: 11   doxycycline (VIBRA-TABS) 100 MG tablet, 2 tablets after sex to prevent STIs, Disp: 60 tablet, Rfl: 3   doxycycline (VIBRA-TABS) 100 MG tablet, Take 1 tablet (100 mg total) by mouth 2 (two) times daily., Disp: 14 tablet, Rfl: 0   hydrocortisone (ANUSOL-HC) 25 MG suppository, Place 1 suppository (25 mg total) rectally 2 (two) times daily. (Patient not taking: Reported on 12/16/2022), Disp: 12 suppository, Rfl: 0   Review of Systems     Objective:   Physical Exam        Assessment & Plan:

## 2023-06-17 ENCOUNTER — Ambulatory Visit: Payer: Self-pay | Admitting: Infectious Disease

## 2023-06-17 ENCOUNTER — Other Ambulatory Visit: Payer: Self-pay

## 2023-06-17 DIAGNOSIS — B20 Human immunodeficiency virus [HIV] disease: Secondary | ICD-10-CM

## 2023-06-17 DIAGNOSIS — E785 Hyperlipidemia, unspecified: Secondary | ICD-10-CM

## 2023-06-17 DIAGNOSIS — Z113 Encounter for screening for infections with a predominantly sexual mode of transmission: Secondary | ICD-10-CM

## 2023-06-17 DIAGNOSIS — Z7185 Encounter for immunization safety counseling: Secondary | ICD-10-CM

## 2023-06-17 DIAGNOSIS — Z79899 Other long term (current) drug therapy: Secondary | ICD-10-CM

## 2023-06-18 ENCOUNTER — Other Ambulatory Visit: Payer: Self-pay

## 2023-06-18 NOTE — Progress Notes (Signed)
 Specialty Pharmacy Refill Coordination Note  Lance Craig is a 43 y.o. male contacted today regarding refills of specialty medication(s) Dolutegravir-lamiVUDine (Dovato)   Patient requested (Patient-Rptd) Pickup at [facility] at [facility] date: (Patient-Rptd) 06/21/23   Medication will be filled on 06/20/23.

## 2023-06-20 ENCOUNTER — Other Ambulatory Visit: Payer: Self-pay

## 2023-07-09 ENCOUNTER — Other Ambulatory Visit: Payer: Self-pay

## 2023-07-15 ENCOUNTER — Other Ambulatory Visit

## 2023-07-15 ENCOUNTER — Other Ambulatory Visit: Payer: Self-pay

## 2023-07-17 ENCOUNTER — Other Ambulatory Visit: Payer: Self-pay

## 2023-07-17 DIAGNOSIS — B20 Human immunodeficiency virus [HIV] disease: Secondary | ICD-10-CM

## 2023-07-17 DIAGNOSIS — Z113 Encounter for screening for infections with a predominantly sexual mode of transmission: Secondary | ICD-10-CM

## 2023-07-18 ENCOUNTER — Other Ambulatory Visit: Payer: Self-pay

## 2023-07-18 ENCOUNTER — Other Ambulatory Visit

## 2023-07-18 ENCOUNTER — Other Ambulatory Visit (HOSPITAL_COMMUNITY): Payer: Self-pay

## 2023-07-18 DIAGNOSIS — Z79899 Other long term (current) drug therapy: Secondary | ICD-10-CM

## 2023-07-18 DIAGNOSIS — B20 Human immunodeficiency virus [HIV] disease: Secondary | ICD-10-CM

## 2023-07-18 DIAGNOSIS — Z113 Encounter for screening for infections with a predominantly sexual mode of transmission: Secondary | ICD-10-CM

## 2023-07-18 NOTE — Progress Notes (Signed)
 Specialty Pharmacy Refill Coordination Note  Lance Craig is a 43 y.o. male contacted today regarding refills of specialty medication(s) Dolutegravir -lamiVUDine  (Dovato )   Patient requested Nechemia Chiappetta Dk at [facility] at [place] date: 07/21/23   Medication will be filled on 07/18/23.

## 2023-07-19 LAB — C. TRACHOMATIS/N. GONORRHOEAE RNA
C. trachomatis RNA, TMA: NOT DETECTED
N. gonorrhoeae RNA, TMA: NOT DETECTED

## 2023-07-22 LAB — COMPLETE METABOLIC PANEL WITHOUT GFR
AG Ratio: 1.6 (calc) (ref 1.0–2.5)
ALT: 16 U/L (ref 9–46)
AST: 21 U/L (ref 10–40)
Albumin: 4.6 g/dL (ref 3.6–5.1)
Alkaline phosphatase (APISO): 82 U/L (ref 36–130)
BUN: 13 mg/dL (ref 7–25)
CO2: 26 mmol/L (ref 20–32)
Calcium: 9.7 mg/dL (ref 8.6–10.3)
Chloride: 104 mmol/L (ref 98–110)
Creat: 1.25 mg/dL (ref 0.60–1.29)
Globulin: 2.9 g/dL (ref 1.9–3.7)
Glucose, Bld: 81 mg/dL (ref 65–99)
Potassium: 3.7 mmol/L (ref 3.5–5.3)
Sodium: 137 mmol/L (ref 135–146)
Total Bilirubin: 0.5 mg/dL (ref 0.2–1.2)
Total Protein: 7.5 g/dL (ref 6.1–8.1)

## 2023-07-22 LAB — CBC WITH DIFFERENTIAL/PLATELET
Absolute Lymphocytes: 1613 {cells}/uL (ref 850–3900)
Absolute Monocytes: 439 {cells}/uL (ref 200–950)
Basophils Absolute: 50 {cells}/uL (ref 0–200)
Basophils Relative: 1.4 %
Eosinophils Absolute: 198 {cells}/uL (ref 15–500)
Eosinophils Relative: 5.5 %
HCT: 41 % (ref 38.5–50.0)
Hemoglobin: 13.7 g/dL (ref 13.2–17.1)
MCH: 30 pg (ref 27.0–33.0)
MCHC: 33.4 g/dL (ref 32.0–36.0)
MCV: 89.9 fL (ref 80.0–100.0)
MPV: 9.3 fL (ref 7.5–12.5)
Monocytes Relative: 12.2 %
Neutro Abs: 1300 {cells}/uL — ABNORMAL LOW (ref 1500–7800)
Neutrophils Relative %: 36.1 %
Platelets: 246 10*3/uL (ref 140–400)
RBC: 4.56 10*6/uL (ref 4.20–5.80)
RDW: 13.4 % (ref 11.0–15.0)
Total Lymphocyte: 44.8 %
WBC: 3.6 10*3/uL — ABNORMAL LOW (ref 3.8–10.8)

## 2023-07-22 LAB — T PALLIDUM AB: T Pallidum Abs: POSITIVE — AB

## 2023-07-22 LAB — LIPID PANEL
Cholesterol: 188 mg/dL (ref <200)
HDL: 62 mg/dL (ref >=40)
LDL Cholesterol (Calc): 107 mg/dL — ABNORMAL HIGH
Non-HDL Cholesterol (Calc): 126 mg/dL (ref <130)
Total CHOL/HDL Ratio: 3 (calc) (ref <5.0)
Triglycerides: 93 mg/dL (ref <150)

## 2023-07-22 LAB — T-HELPER CELLS (CD4) COUNT (NOT AT ARMC)
Absolute CD4: 532 {cells}/uL (ref 490–1740)
CD4 T Helper %: 29 % — ABNORMAL LOW (ref 30–61)
Total lymphocyte count: 1859 {cells}/uL (ref 850–3900)

## 2023-07-22 LAB — HIV-1 RNA QUANT-NO REFLEX-BLD
HIV 1 RNA Quant: 35 {copies}/mL — ABNORMAL HIGH
HIV-1 RNA Quant, Log: 1.54 {Log_copies}/mL — ABNORMAL HIGH

## 2023-07-22 LAB — RPR TITER: RPR Titer: 1:1 {titer} — ABNORMAL HIGH

## 2023-07-22 LAB — RPR: RPR Ser Ql: REACTIVE — AB

## 2023-07-24 ENCOUNTER — Other Ambulatory Visit (HOSPITAL_COMMUNITY): Payer: Self-pay

## 2023-07-28 ENCOUNTER — Encounter: Payer: Self-pay | Admitting: Infectious Disease

## 2023-07-28 DIAGNOSIS — R85610 Atypical squamous cells of undetermined significance on cytologic smear of anus (ASC-US): Secondary | ICD-10-CM

## 2023-07-28 HISTORY — DX: Atypical squamous cells of undetermined significance on cytologic smear of anus (ASC-US): R85.610

## 2023-07-28 NOTE — Progress Notes (Deleted)
   Subjective:  Chief complaint: follow-up for HIV disease on medications   Patient ID: Lance Craig, male    DOB: 09-17-1980, 43 y.o.   MRN: [id number]  HPI   Past Medical History:  Diagnosis Date   Acute renal insufficiency 02/10/2017   Anal wart 08/07/2016   Anemia    Anxiety    Contusion of bone 01/18/2020   Deep venous thrombosis (HCC)    Depression    External hemorrhoid 08/07/2016   Generalized anxiety disorder 08/10/2014   HIV disease (HCC) 08/13/2017   Hyperlipemia 06/19/2022   Hypertension 08/22/2020   Internal hemorrhoid 08/07/2016   Osteoarthritis 01/02/2021   Panic attack 08/10/2014   PCP (pneumocystis jiroveci pneumonia) (HCC)    Syphilis 09/20/2015   Vaccine counseling 01/02/2021    No past surgical history on file.  Family History  Problem Relation Age of Onset   Kidney disease Maternal Grandmother    Cancer Neg Hx    Diabetes Neg Hx       Social History   Socioeconomic History   Marital status: Single    Spouse name: Not on file   Number of children: Not on file   Years of education: Not on file   Highest education level: Not on file  Occupational History   Not on file  Tobacco Use   Smoking status: Never   Smokeless tobacco: Never  Vaping Use   Vaping status: Never Used  Substance and Sexual Activity   Alcohol use: Yes    Alcohol/week: 1.0 standard drink of alcohol    Types: 1 Standard drinks or equivalent per week    Comment: socially   Drug use: Yes    Types: Marijuana    Comment: smokes socially   Sexual activity: Not Currently    Partners: Male    Comment: declined condoms  Other Topics Concern   Not on file  Social History Narrative   Not on file   Social Drivers of Health   Financial Resource Strain: Not on file  Food Insecurity: Not on file  Transportation Needs: Not on file  Physical Activity: Not on file  Stress: Not on file  Social Connections: Not on file    No Known Allergies   Current Outpatient  Medications:    amLODipine  (NORVASC ) 5 MG tablet, Take 1 tablet (5 mg total) by mouth daily., Disp: 30 tablet, Rfl: 11   atorvastatin  (LIPITOR) 20 MG tablet, Take 1 tablet (20 mg total) by mouth daily., Disp: 30 tablet, Rfl: 11   dolutegravir -lamiVUDine  (DOVATO ) 50-300 MG tablet, Take 1 tablet by mouth daily., Disp: 30 tablet, Rfl: 11   doxycycline  (VIBRA -TABS) 100 MG tablet, 2 tablets after sex to prevent STIs, Disp: 60 tablet, Rfl: 3   doxycycline  (VIBRA -TABS) 100 MG tablet, Take 1 tablet (100 mg total) by mouth 2 (two) times daily., Disp: 14 tablet, Rfl: 0   hydrocortisone  (ANUSOL -HC) 25 MG suppository, Place 1 suppository (25 mg total) rectally 2 (two) times daily. (Patient not taking: Reported on 12/16/2022), Disp: 12 suppository, Rfl: 0   Review of Systems     Objective:   Physical Exam        Assessment & Plan:

## 2023-07-29 ENCOUNTER — Ambulatory Visit: Admitting: Infectious Disease

## 2023-07-29 DIAGNOSIS — B20 Human immunodeficiency virus [HIV] disease: Secondary | ICD-10-CM

## 2023-07-29 DIAGNOSIS — E785 Hyperlipidemia, unspecified: Secondary | ICD-10-CM

## 2023-07-29 DIAGNOSIS — R85610 Atypical squamous cells of undetermined significance on cytologic smear of anus (ASC-US): Secondary | ICD-10-CM

## 2023-07-29 DIAGNOSIS — I10 Essential (primary) hypertension: Secondary | ICD-10-CM

## 2023-08-08 NOTE — Progress Notes (Signed)
 The 10-year ASCVD risk score (Arnett DK, et al., 2019) is: 6.4%   Values used to calculate the score:     Age: 43 years     Sex: Male     Is Non-Hispanic African American: Yes     Diabetic: No     Tobacco smoker: No     Systolic Blood Pressure: 141 mmHg     Is BP treated: Yes     HDL Cholesterol: 62 mg/dL     Total Cholesterol: 188 mg/dL  Currently prescribed atorvastatin  20 mg.  Stpehanie Montroy, BSN, RN

## 2023-08-12 ENCOUNTER — Other Ambulatory Visit: Payer: Self-pay

## 2023-08-12 NOTE — Progress Notes (Unsigned)
 Subjective:  Chief complaint: follow-up for HIV disease on medications   Patient ID: Lance Craig, male    DOB: Jul 07, 1980, 43 y.o.   MRN: [id number]  HPI   Discussed the use of AI scribe software for clinical note transcription with the patient, who gave verbal consent to proceed.  History of Present Illness   Lance Craig is a 43 year old male with HIV who presents for a routine follow-up and depression screening.  He has been living with HIV for over 13 years and is currently on Dovato . Recent lab results from early May show a viral load of 35 copies and a CD4 count of 532. He has a history of an abnormal anal Pap smear, which requires further evaluation.  He was diagnosed with gonorrhea and chlamydia in November, and a recent urine test was conducted to check for these infections. Swabs are planned for testing gonorrhea and chlamydia in his mouth and rectum.  He is prescribed atorvastatin  for hyperlipidemia but has not started taking it. He is also on amlodipine  for hypertension. He has experienced significant stress related to loss of his father to lung cancer and his sister to another malignancy that was in her chest.       Past Medical History:  Diagnosis Date   Acute renal insufficiency 02/10/2017   Anal wart 08/07/2016   Anemia    Anxiety    Contusion of bone 01/18/2020   Deep venous thrombosis (HCC)    Depression    External hemorrhoid 08/07/2016   Generalized anxiety disorder 08/10/2014   HIV disease (HCC) 08/13/2017   Hyperlipemia 06/19/2022   Hypertension 08/22/2020   Internal hemorrhoid 08/07/2016   Osteoarthritis 01/02/2021   Panic attack 08/10/2014   Pap smear of anus with ASCUS 07/28/2023   PCP (pneumocystis jiroveci pneumonia) (HCC)    Syphilis 09/20/2015   Vaccine counseling 01/02/2021    No past surgical history on file.  Family History  Problem Relation Age of Onset   Kidney disease Maternal Grandmother    Cancer Neg Hx    Diabetes Neg  Hx       Social History   Socioeconomic History   Marital status: Single    Spouse name: Not on file   Number of children: Not on file   Years of education: Not on file   Highest education level: Not on file  Occupational History   Not on file  Tobacco Use   Smoking status: Never   Smokeless tobacco: Never  Vaping Use   Vaping status: Never Used  Substance and Sexual Activity   Alcohol use: Yes    Alcohol/week: 1.0 standard drink of alcohol    Types: 1 Standard drinks or equivalent per week    Comment: socially   Drug use: Yes    Types: Marijuana    Comment: smokes socially   Sexual activity: Not Currently    Partners: Male    Comment: declined condoms  Other Topics Concern   Not on file  Social History Narrative   Not on file   Social Drivers of Health   Financial Resource Strain: Not on file  Food Insecurity: Not on file  Transportation Needs: Not on file  Physical Activity: Not on file  Stress: Not on file  Social Connections: Not on file    No Known Allergies   Current Outpatient Medications:    amLODipine  (NORVASC ) 5 MG tablet, Take 1 tablet (5 mg total) by mouth daily., Disp: 30 tablet, Rfl:  11   atorvastatin  (LIPITOR) 20 MG tablet, Take 1 tablet (20 mg total) by mouth daily., Disp: 30 tablet, Rfl: 11   dolutegravir -lamiVUDine  (DOVATO ) 50-300 MG tablet, Take 1 tablet by mouth daily., Disp: 30 tablet, Rfl: 11   doxycycline  (VIBRA -TABS) 100 MG tablet, 2 tablets after sex to prevent STIs, Disp: 60 tablet, Rfl: 3   doxycycline  (VIBRA -TABS) 100 MG tablet, Take 1 tablet (100 mg total) by mouth 2 (two) times daily., Disp: 14 tablet, Rfl: 0   hydrocortisone  (ANUSOL -HC) 25 MG suppository, Place 1 suppository (25 mg total) rectally 2 (two) times daily. (Patient not taking: Reported on 12/16/2022), Disp: 12 suppository, Rfl: 0   Review of Systems  Constitutional:  Negative for activity change, appetite change, chills, diaphoresis, fatigue, fever and unexpected  weight change.  HENT:  Negative for congestion, rhinorrhea, sinus pressure, sneezing, sore throat and trouble swallowing.   Eyes:  Negative for photophobia and visual disturbance.  Respiratory:  Negative for cough, chest tightness, shortness of breath, wheezing and stridor.   Cardiovascular:  Negative for chest pain, palpitations and leg swelling.  Gastrointestinal:  Negative for abdominal distention, abdominal pain, anal bleeding, blood in stool, constipation, diarrhea, nausea and vomiting.  Genitourinary:  Negative for difficulty urinating, dysuria, flank pain and hematuria.  Musculoskeletal:  Negative for arthralgias, back pain, gait problem, joint swelling and myalgias.  Skin:  Negative for color change, pallor, rash and wound.  Neurological:  Negative for dizziness, tremors, weakness and light-headedness.  Hematological:  Negative for adenopathy. Does not bruise/bleed easily.  Psychiatric/Behavioral:  Negative for agitation, behavioral problems, confusion, decreased concentration, dysphoric mood and sleep disturbance.        Objective:   Physical Exam Constitutional:      Appearance: He is well-developed.  HENT:     Head: Normocephalic and atraumatic.  Eyes:     Conjunctiva/sclera: Conjunctivae normal.  Cardiovascular:     Rate and Rhythm: Normal rate and regular rhythm.  Pulmonary:     Effort: Pulmonary effort is normal. No respiratory distress.     Breath sounds: No wheezing.  Abdominal:     General: There is no distension.     Palpations: Abdomen is soft.  Musculoskeletal:        General: No tenderness. Normal range of motion.     Cervical back: Normal range of motion and neck supple.  Skin:    General: Skin is warm and dry.     Coloration: Skin is not pale.     Findings: No erythema or rash.  Neurological:     General: No focal deficit present.     Mental Status: He is alert and oriented to person, place, and time.  Psychiatric:        Mood and Affect: Mood normal.         Behavior: Behavior normal.        Thought Content: Thought content normal.        Judgment: Judgment normal.           Assessment & Plan:   Assessment and Plan    HIV infection HIV well-controlled, viral load 35 copies, CD4 count 532, stable for over a decade. - Continue Dovato .  Abnormal anal Pap smear Abnormal anal Pap smear requires further evaluation to rule out precancerous cells or cancer. High-resolution anoscopy recommended. - Refer to Dr. Joyce Nixon at [facility] for high-resolution anoscopy.  Hyperlipidemia Hyperlipidemia requires management with atorvastatin  to reduce cholesterol and inflammation, decreasing cardiovascular risk. - Encourage starting atorvastatin .  Hypertension Hypertension managed with amlodipine .  Screening for depression No significant depression, but recent family losses may impact mental health. - Connect with a counselor for support.

## 2023-08-13 ENCOUNTER — Ambulatory Visit (INDEPENDENT_AMBULATORY_CARE_PROVIDER_SITE_OTHER): Admitting: Infectious Disease

## 2023-08-13 ENCOUNTER — Other Ambulatory Visit: Payer: Self-pay

## 2023-08-13 ENCOUNTER — Encounter: Payer: Self-pay | Admitting: Infectious Disease

## 2023-08-13 ENCOUNTER — Other Ambulatory Visit (HOSPITAL_COMMUNITY): Payer: Self-pay

## 2023-08-13 VITALS — BP 145/88 | HR 80 | Temp 97.6°F | Ht 69.0 in | Wt 210.0 lb

## 2023-08-13 DIAGNOSIS — F4321 Adjustment disorder with depressed mood: Secondary | ICD-10-CM

## 2023-08-13 DIAGNOSIS — R85610 Atypical squamous cells of undetermined significance on cytologic smear of anus (ASC-US): Secondary | ICD-10-CM | POA: Diagnosis not present

## 2023-08-13 DIAGNOSIS — E785 Hyperlipidemia, unspecified: Secondary | ICD-10-CM

## 2023-08-13 DIAGNOSIS — A749 Chlamydial infection, unspecified: Secondary | ICD-10-CM

## 2023-08-13 DIAGNOSIS — I10 Essential (primary) hypertension: Secondary | ICD-10-CM

## 2023-08-13 DIAGNOSIS — Z7185 Encounter for immunization safety counseling: Secondary | ICD-10-CM

## 2023-08-13 DIAGNOSIS — B20 Human immunodeficiency virus [HIV] disease: Secondary | ICD-10-CM | POA: Diagnosis not present

## 2023-08-13 MED ORDER — DOVATO 50-300 MG PO TABS
1.0000 | ORAL_TABLET | Freq: Every day | ORAL | 11 refills | Status: DC
  Filled 2023-08-13 – 2023-08-20 (×2): qty 30, 30d supply, fill #0
  Filled 2023-09-18: qty 30, 30d supply, fill #1
  Filled 2023-10-21: qty 30, 30d supply, fill #2
  Filled 2023-11-19 – 2023-11-28 (×3): qty 30, 30d supply, fill #3
  Filled 2023-12-24 – 2024-01-06 (×2): qty 30, 30d supply, fill #4

## 2023-08-13 MED ORDER — DOXYCYCLINE HYCLATE 100 MG PO TABS
100.0000 mg | ORAL_TABLET | Freq: Two times a day (BID) | ORAL | 0 refills | Status: DC
  Filled 2023-08-13: qty 14, 7d supply, fill #0

## 2023-08-13 MED ORDER — ATORVASTATIN CALCIUM 20 MG PO TABS
20.0000 mg | ORAL_TABLET | Freq: Every day | ORAL | 11 refills | Status: DC
  Filled 2023-08-13: qty 30, 30d supply, fill #0
  Filled 2023-09-18: qty 30, 30d supply, fill #1
  Filled 2023-10-22: qty 30, 30d supply, fill #2
  Filled 2023-11-24: qty 30, 30d supply, fill #3
  Filled 2023-12-24 – 2024-01-06 (×2): qty 30, 30d supply, fill #4

## 2023-08-13 MED ORDER — AMLODIPINE BESYLATE 5 MG PO TABS
5.0000 mg | ORAL_TABLET | Freq: Every day | ORAL | 11 refills | Status: DC
  Filled 2023-08-13: qty 30, 30d supply, fill #0
  Filled 2023-09-18: qty 30, 30d supply, fill #1
  Filled 2023-10-22: qty 30, 30d supply, fill #2
  Filled 2023-11-24: qty 30, 30d supply, fill #3
  Filled 2023-12-24 – 2024-01-06 (×2): qty 30, 30d supply, fill #4

## 2023-08-13 NOTE — Patient Instructions (Signed)
 You need to have a High Resolution Anoscopy with Dr. Andy Bannister at    Address: [address], [place], [place] [address] Phone: [phone] Products and Services: [email or web address]

## 2023-08-14 LAB — GC/CHLAMYDIA PROBE, AMP (THROAT)
Chlamydia trachomatis RNA: NOT DETECTED
Neisseria gonorrhoeae RNA: NOT DETECTED

## 2023-08-14 LAB — CT/NG RNA, TMA RECTAL
Chlamydia Trachomatis RNA: NOT DETECTED
Neisseria Gonorrhoeae RNA: NOT DETECTED

## 2023-08-15 ENCOUNTER — Other Ambulatory Visit: Payer: Self-pay | Admitting: Pharmacist

## 2023-08-15 NOTE — Progress Notes (Signed)
 Specialty Pharmacy Ongoing Clinical Assessment Note  Lance Craig is a 43 y.o. male who is being followed by the specialty pharmacy service for RxSp HIV   Patient's specialty medication(s) reviewed today: Dolutegravir -lamiVUDine  (Dovato )   Missed doses in the last 4 weeks: 0   Patient/Caregiver did not have any additional questions or concerns.   Therapeutic benefit summary: Patient is achieving benefit   Adverse events/side effects summary: No adverse events/side effects   Patient's therapy is appropriate to: Continue    Goals Addressed             This Visit's Progress    Achieve Undetectable HIV Viral Load < 20   On track    Patient is unable to be assessed as therapy was recently initiated. Patient will maintain adherence       Comply with lab assessments   On track    Patient is on track. Patient will adhere to provider and/or lab appointments.      Improve or maintain quality of life   On track    Patient is on track. Patient will be evaluated at upcoming provider appointment to assess progress.         Follow up: 6 months  Sonya Duster Specialty Pharmacist

## 2023-08-18 ENCOUNTER — Other Ambulatory Visit: Payer: Self-pay

## 2023-08-20 ENCOUNTER — Other Ambulatory Visit: Payer: Self-pay

## 2023-08-20 ENCOUNTER — Other Ambulatory Visit (HOSPITAL_COMMUNITY): Payer: Self-pay

## 2023-08-21 ENCOUNTER — Other Ambulatory Visit (HOSPITAL_COMMUNITY): Payer: Self-pay

## 2023-09-15 ENCOUNTER — Other Ambulatory Visit (HOSPITAL_COMMUNITY): Payer: Self-pay

## 2023-09-18 ENCOUNTER — Other Ambulatory Visit: Payer: Self-pay

## 2023-09-18 NOTE — Progress Notes (Signed)
 Specialty Pharmacy Refill Coordination Note  Lance Craig is a 43 y.o. male contacted today regarding refills of specialty medication(s) Dolutegravir -lamiVUDine  (Dovato )   Patient requested Pickup at [facility] at [place] date: 09/18/23   Medication will be filled on 09/18/23.

## 2023-10-08 ENCOUNTER — Other Ambulatory Visit (HOSPITAL_COMMUNITY): Payer: Self-pay

## 2023-10-21 ENCOUNTER — Other Ambulatory Visit: Payer: Self-pay

## 2023-10-22 ENCOUNTER — Encounter (INDEPENDENT_AMBULATORY_CARE_PROVIDER_SITE_OTHER): Payer: Self-pay

## 2023-10-22 ENCOUNTER — Other Ambulatory Visit: Payer: Self-pay

## 2023-10-22 ENCOUNTER — Other Ambulatory Visit: Payer: Self-pay | Admitting: Pharmacy Technician

## 2023-10-22 NOTE — Progress Notes (Signed)
 Specialty Pharmacy Refill Coordination Note  Lance Craig is a 43 y.o. male contacted today regarding refills of specialty medication(s) Dolutegravir -lamiVUDine  (Dovato )   Patient requested (Patient-Rptd) Pickup at [facility] at [facility] date: (Patient-Rptd) 10/25/23   Medication will be filled on 10/24/23.

## 2023-10-23 ENCOUNTER — Other Ambulatory Visit: Payer: Self-pay

## 2023-11-14 ENCOUNTER — Other Ambulatory Visit (HOSPITAL_COMMUNITY): Payer: Self-pay

## 2023-11-19 ENCOUNTER — Other Ambulatory Visit (HOSPITAL_COMMUNITY): Payer: Self-pay

## 2023-11-21 ENCOUNTER — Other Ambulatory Visit: Payer: Self-pay

## 2023-11-25 ENCOUNTER — Other Ambulatory Visit: Payer: Self-pay

## 2023-11-28 ENCOUNTER — Other Ambulatory Visit: Payer: Self-pay

## 2023-11-28 ENCOUNTER — Other Ambulatory Visit (HOSPITAL_COMMUNITY): Payer: Self-pay

## 2023-11-28 NOTE — Progress Notes (Signed)
 Specialty Pharmacy Refill Coordination Note  Lance Craig is a 43 y.o. male contacted today regarding refills of specialty medication(s) Dolutegravir -lamiVUDine  (Dovato )   Patient requested Pickup at [facility] at [place] date: 11/28/23   Medication will be filled on 11/28/23.

## 2023-11-29 ENCOUNTER — Other Ambulatory Visit (HOSPITAL_COMMUNITY): Payer: Self-pay

## 2023-12-22 ENCOUNTER — Other Ambulatory Visit: Payer: Self-pay

## 2023-12-24 ENCOUNTER — Other Ambulatory Visit: Payer: Self-pay

## 2023-12-24 ENCOUNTER — Encounter (INDEPENDENT_AMBULATORY_CARE_PROVIDER_SITE_OTHER): Payer: Self-pay

## 2023-12-24 ENCOUNTER — Other Ambulatory Visit (HOSPITAL_COMMUNITY): Payer: Self-pay

## 2023-12-24 NOTE — Progress Notes (Signed)
 Specialty Pharmacy Refill Coordination Note  MyChart Questionnaire Submission  Lance Craig is a 43 y.o. male contacted today regarding refills of specialty medication(s) Dovato .  Doses on hand: (Patient-Rptd) Maybe 10   Patient requested: (Patient-Rptd) Pickup at [facility] at [facility] date: 12/27/23  Medication will be filled on 12/25/23.

## 2024-01-03 ENCOUNTER — Other Ambulatory Visit (HOSPITAL_COMMUNITY): Payer: Self-pay

## 2024-01-05 ENCOUNTER — Other Ambulatory Visit: Payer: Self-pay

## 2024-01-06 ENCOUNTER — Other Ambulatory Visit (HOSPITAL_COMMUNITY): Payer: Self-pay

## 2024-01-06 ENCOUNTER — Other Ambulatory Visit: Payer: Self-pay

## 2024-01-20 NOTE — Progress Notes (Unsigned)
   Subjective:  Chief complaint: follow-up for HIV disease on medications   Patient ID: Lance Craig, male    DOB: [birth year], 43 y.o.   MRN: [id number]  HPI  Past Medical History:  Diagnosis Date   Acute renal insufficiency 02/10/2017   Anal wart 08/07/2016   Anemia    Anxiety    Contusion of bone 01/18/2020   Deep venous thrombosis (HCC)    Depression    External hemorrhoid 08/07/2016   Generalized anxiety disorder 08/10/2014   HIV disease (HCC) 08/13/2017   Hyperlipemia 06/19/2022   Hypertension 08/22/2020   Internal hemorrhoid 08/07/2016   Osteoarthritis 01/02/2021   Panic attack 08/10/2014   Pap smear of anus with ASCUS 07/28/2023   PCP (pneumocystis jiroveci pneumonia) (HCC)    Syphilis 09/20/2015   Vaccine counseling 01/02/2021    No past surgical history on file.  Family History  Problem Relation Age of Onset   Kidney disease Maternal Grandmother    Cancer Neg Hx    Diabetes Neg Hx       Social History   Socioeconomic History   Marital status: Single    Spouse name: Not on file   Number of children: Not on file   Years of education: Not on file   Highest education level: Not on file  Occupational History   Not on file  Tobacco Use   Smoking status: Never   Smokeless tobacco: Never  Vaping Use   Vaping status: Never Used  Substance and Sexual Activity   Alcohol use: Yes    Alcohol/week: 1.0 standard drink of alcohol    Types: 1 Standard drinks or equivalent per week    Comment: socially   Drug use: Yes    Types: Marijuana    Comment: smokes socially   Sexual activity: Not Currently    Partners: Male    Comment: declined condoms  Other Topics Concern   Not on file  Social History Narrative   Not on file   Social Drivers of Health   Financial Resource Strain: Not on file  Food Insecurity: Not on file  Transportation Needs: Not on file  Physical Activity: Not on file  Stress: Not on file  Social Connections: Not on file    No  Known Allergies   Current Outpatient Medications:    amLODipine  (NORVASC ) 5 MG tablet, Take 1 tablet (5 mg total) by mouth daily., Disp: 30 tablet, Rfl: 11   atorvastatin  (LIPITOR) 20 MG tablet, Take 1 tablet (20 mg total) by mouth daily., Disp: 30 tablet, Rfl: 11   dolutegravir -lamiVUDine  (DOVATO ) 50-300 MG tablet, Take 1 tablet by mouth daily., Disp: 30 tablet, Rfl: 11   doxycycline  (VIBRA -TABS) 100 MG tablet, 2 tablets after sex to prevent STIs, Disp: 60 tablet, Rfl: 3   doxycycline  (VIBRA -TABS) 100 MG tablet, Take 1 tablet (100 mg total) by mouth 2 (two) times daily., Disp: 14 tablet, Rfl: 0   hydrocortisone  (ANUSOL -HC) 25 MG suppository, Place 1 suppository (25 mg total) rectally 2 (two) times daily. (Patient not taking: Reported on 08/13/2023), Disp: 12 suppository, Rfl: 0   Review of Systems     Objective:   Physical Exam        Assessment & Plan:

## 2024-01-21 ENCOUNTER — Ambulatory Visit: Admitting: Infectious Disease

## 2024-01-21 ENCOUNTER — Other Ambulatory Visit: Payer: Self-pay

## 2024-01-21 ENCOUNTER — Other Ambulatory Visit (HOSPITAL_COMMUNITY): Payer: Self-pay

## 2024-01-21 ENCOUNTER — Encounter: Payer: Self-pay | Admitting: Infectious Disease

## 2024-01-21 ENCOUNTER — Other Ambulatory Visit (HOSPITAL_COMMUNITY)
Admission: RE | Admit: 2024-01-21 | Discharge: 2024-01-21 | Disposition: A | Discharge status: Home / Self Care | Source: Ambulatory Visit | Attending: Infectious Disease | Admitting: Infectious Disease

## 2024-01-21 VITALS — BP 143/89 | HR 61 | Temp 97.3°F | Ht 69.0 in | Wt 215.0 lb

## 2024-01-21 DIAGNOSIS — Z79899 Other long term (current) drug therapy: Secondary | ICD-10-CM

## 2024-01-21 DIAGNOSIS — Z7185 Encounter for immunization safety counseling: Secondary | ICD-10-CM | POA: Diagnosis present

## 2024-01-21 DIAGNOSIS — B20 Human immunodeficiency virus [HIV] disease: Secondary | ICD-10-CM

## 2024-01-21 DIAGNOSIS — R85610 Atypical squamous cells of undetermined significance on cytologic smear of anus (ASC-US): Secondary | ICD-10-CM | POA: Insufficient documentation

## 2024-01-21 DIAGNOSIS — E785 Hyperlipidemia, unspecified: Secondary | ICD-10-CM

## 2024-01-21 DIAGNOSIS — I10 Essential (primary) hypertension: Secondary | ICD-10-CM

## 2024-01-21 DIAGNOSIS — Z23 Encounter for immunization: Secondary | ICD-10-CM | POA: Diagnosis not present

## 2024-01-21 MED ORDER — DOXYCYCLINE HYCLATE 100 MG PO TABS
200.0000 mg | ORAL_TABLET | ORAL | 3 refills | Status: AC
  Filled 2024-01-21: qty 60, 30d supply, fill #0

## 2024-01-21 MED ORDER — ATORVASTATIN CALCIUM 20 MG PO TABS
20.0000 mg | ORAL_TABLET | Freq: Every day | ORAL | 11 refills | Status: AC
  Filled 2024-01-21 – 2024-02-04 (×2): qty 30, 30d supply, fill #0
  Filled 2024-03-10: qty 30, 30d supply, fill #1
  Filled 2024-04-14: qty 30, 30d supply, fill #2

## 2024-01-21 MED ORDER — AMLODIPINE BESYLATE 5 MG PO TABS
5.0000 mg | ORAL_TABLET | Freq: Every day | ORAL | 11 refills | Status: AC
  Filled 2024-01-21 – 2024-02-04 (×2): qty 30, 30d supply, fill #0
  Filled 2024-03-10: qty 30, 30d supply, fill #1
  Filled 2024-04-14: qty 30, 30d supply, fill #2

## 2024-01-21 MED ORDER — DOVATO 50-300 MG PO TABS
1.0000 | ORAL_TABLET | Freq: Every day | ORAL | 11 refills | Status: AC
  Filled 2024-01-21 – 2024-02-04 (×3): qty 30, 30d supply, fill #0
  Filled 2024-03-02 – 2024-03-15 (×2): qty 30, 30d supply, fill #1
  Filled 2024-04-15: qty 30, 30d supply, fill #2

## 2024-01-21 NOTE — Patient Instructions (Addendum)
 Call [facility] to make an appt with  Bernarda Ned, MD   [phone]

## 2024-01-22 ENCOUNTER — Ambulatory Visit: Payer: Self-pay

## 2024-01-22 LAB — CYTOLOGY, (ORAL, ANAL, URETHRAL) ANCILLARY ONLY
Chlamydia: NEGATIVE
Chlamydia: POSITIVE — AB
Comment: NEGATIVE
Comment: NEGATIVE
Comment: NORMAL
Comment: NORMAL
Neisseria Gonorrhea: NEGATIVE
Neisseria Gonorrhea: NEGATIVE

## 2024-01-22 LAB — URINE CYTOLOGY ANCILLARY ONLY
Chlamydia: NEGATIVE
Comment: NEGATIVE
Comment: NORMAL
Neisseria Gonorrhea: NEGATIVE

## 2024-01-22 LAB — T-HELPER CELLS (CD4) COUNT (NOT AT ARMC)
CD4 % Helper T Cell: 29 % — ABNORMAL LOW (ref 33–65)
CD4 T Cell Abs: 564 /uL (ref 400–1790)

## 2024-01-22 NOTE — Telephone Encounter (Signed)
 Attempted to contact patient via phone, left HIPAA compliant voice message to contact office. Staff will also sent patient a mychart message regarding labs and treatment needed.  Enis Kleine, LPN

## 2024-01-22 NOTE — Telephone Encounter (Signed)
-----   Message from [place] sent at 01/22/2024 11:48 AM EST ----- Regarding: FW: Pt will need doxycycline  100 mg bid x 7 days and partners treated and tested ----- Message ----- From: Rebecka Hose Lab Results In Sent: 01/21/2024  10:37 PM EST To: Jomarie LOISE Fleeta Kathie, MD

## 2024-01-28 LAB — COMPLETE METABOLIC PANEL WITHOUT GFR
AG Ratio: 1.6 (calc) (ref 1.0–2.5)
ALT: 20 U/L (ref 9–46)
AST: 22 U/L (ref 10–40)
Albumin: 4.6 g/dL (ref 3.6–5.1)
Alkaline phosphatase (APISO): 87 U/L (ref 36–130)
BUN/Creatinine Ratio: 8 (calc) (ref 6–22)
BUN: 11 mg/dL (ref 7–25)
CO2: 25 mmol/L (ref 20–32)
Calcium: 9.7 mg/dL (ref 8.6–10.3)
Chloride: 103 mmol/L (ref 98–110)
Creat: 1.32 mg/dL — ABNORMAL HIGH (ref 0.60–1.29)
Globulin: 2.9 g/dL (ref 1.9–3.7)
Glucose, Bld: 84 mg/dL (ref 65–99)
Potassium: 4 mmol/L (ref 3.5–5.3)
Sodium: 137 mmol/L (ref 135–146)
Total Bilirubin: 0.4 mg/dL (ref 0.2–1.2)
Total Protein: 7.5 g/dL (ref 6.1–8.1)

## 2024-01-28 LAB — CBC WITH DIFFERENTIAL/PLATELET
Absolute Lymphocytes: 2101 {cells}/uL (ref 850–3900)
Absolute Monocytes: 577 {cells}/uL (ref 200–950)
Basophils Absolute: 78 {cells}/uL (ref 0–200)
Basophils Relative: 1.5 %
Eosinophils Absolute: 338 {cells}/uL (ref 15–500)
Eosinophils Relative: 6.5 %
HCT: 41.6 % (ref 38.5–50.0)
Hemoglobin: 13.7 g/dL (ref 13.2–17.1)
MCH: 29.4 pg (ref 27.0–33.0)
MCHC: 32.9 g/dL (ref 32.0–36.0)
MCV: 89.3 fL (ref 80.0–100.0)
MPV: 9.4 fL (ref 7.5–12.5)
Monocytes Relative: 11.1 %
Neutro Abs: 2106 {cells}/uL (ref 1500–7800)
Neutrophils Relative %: 40.5 %
Platelets: 295 Thousand/uL (ref 140–400)
RBC: 4.66 Million/uL (ref 4.20–5.80)
RDW: 13.6 % (ref 11.0–15.0)
Total Lymphocyte: 40.4 %
WBC: 5.2 Thousand/uL (ref 3.8–10.8)

## 2024-01-28 LAB — HIV-1 RNA QUANT-NO REFLEX-BLD
HIV 1 RNA Quant: NOT DETECTED {copies}/mL
HIV-1 RNA Quant, Log: NOT DETECTED {Log_copies}/mL

## 2024-01-28 LAB — LIPID PANEL
Cholesterol: 194 mg/dL (ref <200)
HDL: 56 mg/dL (ref >=40)
LDL Cholesterol (Calc): 113 mg/dL — ABNORMAL HIGH
Non-HDL Cholesterol (Calc): 138 mg/dL — ABNORMAL HIGH (ref <130)
Total CHOL/HDL Ratio: 3.5 (calc) (ref <5.0)
Triglycerides: 136 mg/dL (ref <150)

## 2024-01-28 LAB — RPR TITER: RPR Titer: 1:2 {titer} — ABNORMAL HIGH

## 2024-01-28 LAB — T PALLIDUM AB: T Pallidum Abs: POSITIVE — AB

## 2024-01-28 LAB — RPR: RPR Ser Ql: REACTIVE — AB

## 2024-01-29 ENCOUNTER — Other Ambulatory Visit: Payer: Self-pay

## 2024-01-29 ENCOUNTER — Other Ambulatory Visit (HOSPITAL_COMMUNITY): Payer: Self-pay

## 2024-02-01 ENCOUNTER — Other Ambulatory Visit (HOSPITAL_COMMUNITY): Payer: Self-pay

## 2024-02-02 ENCOUNTER — Other Ambulatory Visit (HOSPITAL_COMMUNITY): Payer: Self-pay

## 2024-02-04 ENCOUNTER — Other Ambulatory Visit: Payer: Self-pay

## 2024-02-04 ENCOUNTER — Other Ambulatory Visit (HOSPITAL_COMMUNITY): Payer: Self-pay

## 2024-02-04 NOTE — Progress Notes (Signed)
 Specialty Pharmacy Refill Coordination Note  Lance Craig is a 43 y.o. male contacted today regarding refills of specialty medication(s) Dolutegravir -lamiVUDine  (Dovato )   Patient requested Pickup at [facility] at [place] date: 02/07/24   Medication will be filled on: 02/04/24

## 2024-03-02 ENCOUNTER — Other Ambulatory Visit: Payer: Self-pay

## 2024-03-04 ENCOUNTER — Other Ambulatory Visit (HOSPITAL_COMMUNITY): Payer: Self-pay

## 2024-03-08 ENCOUNTER — Other Ambulatory Visit: Payer: Self-pay

## 2024-03-15 ENCOUNTER — Other Ambulatory Visit (HOSPITAL_COMMUNITY): Payer: Self-pay

## 2024-03-15 ENCOUNTER — Other Ambulatory Visit: Payer: Self-pay

## 2024-03-15 NOTE — Progress Notes (Signed)
 Specialty Pharmacy Refill Coordination Note  Lance Craig is a 43 y.o. male contacted today regarding refills of specialty medication(s) Dolutegravir -lamiVUDine  (Dovato )   Patient requested Pickup at [facility] at [place] date: 03/16/24   Medication will be filled on: 03/16/24

## 2024-03-16 ENCOUNTER — Other Ambulatory Visit: Payer: Self-pay

## 2024-04-15 ENCOUNTER — Other Ambulatory Visit: Payer: Self-pay

## 2024-04-20 ENCOUNTER — Other Ambulatory Visit (HOSPITAL_COMMUNITY): Payer: Self-pay

## 2024-04-22 ENCOUNTER — Other Ambulatory Visit: Payer: Self-pay

## 2024-04-22 NOTE — Progress Notes (Signed)
 Specialty Pharmacy Refill Coordination Note  Lance Craig is a 44 y.o. male contacted today regarding refills of specialty medication(s) Dolutegravir -lamiVUDine  (Dovato )   Patient requested Pickup at [facility] at [place] date: 04/23/24   Medication will be filled on: 04/22/24

## 2024-07-20 ENCOUNTER — Ambulatory Visit: Admitting: Infectious Disease
